# Patient Record
Sex: Male | Born: 1965 | Race: Black or African American | Hispanic: No | Marital: Married | Smoking: Never smoker
Health system: Southern US, Community
[De-identification: ages and names within clinical notes are randomized; demographics above are authoritative.]

## PROBLEM LIST (undated history)

## (undated) DIAGNOSIS — R0602 Shortness of breath: Secondary | ICD-10-CM

## (undated) DIAGNOSIS — M5136 Other intervertebral disc degeneration, lumbar region: Secondary | ICD-10-CM

## (undated) DIAGNOSIS — M549 Dorsalgia, unspecified: Secondary | ICD-10-CM

## (undated) DIAGNOSIS — R5383 Other fatigue: Secondary | ICD-10-CM

## (undated) DIAGNOSIS — E785 Hyperlipidemia, unspecified: Secondary | ICD-10-CM

## (undated) DIAGNOSIS — E291 Testicular hypofunction: Secondary | ICD-10-CM

## (undated) DIAGNOSIS — I10 Essential (primary) hypertension: Secondary | ICD-10-CM

## (undated) DIAGNOSIS — M2550 Pain in unspecified joint: Secondary | ICD-10-CM

## (undated) HISTORY — DX: Hyperlipidemia, unspecified: E78.5

## (undated) HISTORY — DX: Testicular hypofunction: E29.1

## (undated) HISTORY — DX: Pain in unspecified joint: M25.50

## (undated) HISTORY — DX: Other intervertebral disc degeneration, lumbar region: M51.36

## (undated) HISTORY — DX: Other fatigue: R53.83

## (undated) HISTORY — DX: Shortness of breath: R06.02

## (undated) HISTORY — DX: Dorsalgia, unspecified: M54.9

---

## 1998-11-30 ENCOUNTER — Emergency Department (HOSPITAL_COMMUNITY): Admission: EM | Admit: 1998-11-30 | Discharge: 1998-12-01 | Payer: Self-pay | Admitting: Internal Medicine

## 1999-12-19 ENCOUNTER — Emergency Department (HOSPITAL_COMMUNITY): Admission: EM | Admit: 1999-12-19 | Discharge: 1999-12-19 | Payer: Self-pay

## 2000-01-05 ENCOUNTER — Ambulatory Visit (HOSPITAL_COMMUNITY): Admission: RE | Admit: 2000-01-05 | Discharge: 2000-01-05 | Payer: Self-pay | Admitting: Neurosurgery

## 2000-02-16 ENCOUNTER — Encounter: Admission: RE | Admit: 2000-02-16 | Discharge: 2000-03-11 | Payer: Self-pay | Admitting: Neurosurgery

## 2001-06-13 ENCOUNTER — Emergency Department (HOSPITAL_COMMUNITY): Admission: EM | Admit: 2001-06-13 | Discharge: 2001-06-13 | Payer: Self-pay | Admitting: Emergency Medicine

## 2001-06-13 ENCOUNTER — Encounter: Payer: Self-pay | Admitting: Emergency Medicine

## 2002-03-31 ENCOUNTER — Emergency Department (HOSPITAL_COMMUNITY): Admission: EM | Admit: 2002-03-31 | Discharge: 2002-03-31 | Payer: Self-pay | Admitting: Emergency Medicine

## 2002-11-29 ENCOUNTER — Emergency Department (HOSPITAL_COMMUNITY): Admission: EM | Admit: 2002-11-29 | Discharge: 2002-11-29 | Payer: Self-pay | Admitting: Emergency Medicine

## 2002-11-29 ENCOUNTER — Encounter: Payer: Self-pay | Admitting: Emergency Medicine

## 2003-01-11 ENCOUNTER — Encounter: Payer: Self-pay | Admitting: Emergency Medicine

## 2003-01-11 ENCOUNTER — Emergency Department (HOSPITAL_COMMUNITY): Admission: EM | Admit: 2003-01-11 | Discharge: 2003-01-11 | Payer: Self-pay | Admitting: Emergency Medicine

## 2003-05-04 HISTORY — PX: BACK SURGERY: SHX140

## 2003-08-06 ENCOUNTER — Emergency Department (HOSPITAL_COMMUNITY): Admission: EM | Admit: 2003-08-06 | Discharge: 2003-08-06 | Payer: Self-pay | Admitting: Emergency Medicine

## 2003-09-12 ENCOUNTER — Inpatient Hospital Stay (HOSPITAL_COMMUNITY): Admission: RE | Admit: 2003-09-12 | Discharge: 2003-09-22 | Payer: Self-pay | Admitting: Neurological Surgery

## 2003-10-15 ENCOUNTER — Encounter: Admission: RE | Admit: 2003-10-15 | Discharge: 2003-10-15 | Payer: Self-pay | Admitting: Neurological Surgery

## 2003-12-03 ENCOUNTER — Encounter: Admission: RE | Admit: 2003-12-03 | Discharge: 2003-12-03 | Payer: Self-pay | Admitting: Neurological Surgery

## 2004-03-31 ENCOUNTER — Encounter: Admission: RE | Admit: 2004-03-31 | Discharge: 2004-03-31 | Payer: Self-pay | Admitting: Neurological Surgery

## 2004-04-14 ENCOUNTER — Emergency Department (HOSPITAL_COMMUNITY): Admission: AC | Admit: 2004-04-14 | Discharge: 2004-04-15 | Payer: Self-pay

## 2004-05-07 ENCOUNTER — Ambulatory Visit (HOSPITAL_COMMUNITY): Admission: RE | Admit: 2004-05-07 | Discharge: 2004-05-07 | Payer: Self-pay | Admitting: Chiropractic Medicine

## 2004-07-03 ENCOUNTER — Encounter: Admission: RE | Admit: 2004-07-03 | Discharge: 2004-07-03 | Payer: Self-pay | Admitting: Neurological Surgery

## 2004-09-06 ENCOUNTER — Emergency Department (HOSPITAL_COMMUNITY): Admission: EM | Admit: 2004-09-06 | Discharge: 2004-09-06 | Payer: Self-pay | Admitting: Emergency Medicine

## 2005-01-27 ENCOUNTER — Encounter: Admission: RE | Admit: 2005-01-27 | Discharge: 2005-01-27 | Payer: Self-pay | Admitting: Neurological Surgery

## 2005-08-25 ENCOUNTER — Encounter: Admission: RE | Admit: 2005-08-25 | Discharge: 2005-08-25 | Payer: Self-pay | Admitting: Neurological Surgery

## 2006-06-10 ENCOUNTER — Inpatient Hospital Stay (HOSPITAL_COMMUNITY): Admission: EM | Admit: 2006-06-10 | Discharge: 2006-06-11 | Payer: Self-pay | Admitting: Emergency Medicine

## 2007-05-30 ENCOUNTER — Ambulatory Visit: Payer: Self-pay | Admitting: Cardiovascular Disease

## 2007-05-30 ENCOUNTER — Inpatient Hospital Stay (HOSPITAL_COMMUNITY): Admission: EM | Admit: 2007-05-30 | Discharge: 2007-05-31 | Payer: Self-pay | Admitting: Emergency Medicine

## 2007-05-30 ENCOUNTER — Encounter (INDEPENDENT_AMBULATORY_CARE_PROVIDER_SITE_OTHER): Payer: Self-pay | Admitting: Cardiovascular Disease

## 2007-05-31 ENCOUNTER — Ambulatory Visit: Payer: Self-pay | Admitting: Internal Medicine

## 2008-08-08 ENCOUNTER — Ambulatory Visit: Payer: Self-pay | Admitting: Cardiology

## 2008-08-08 ENCOUNTER — Encounter: Payer: Self-pay | Admitting: Cardiology

## 2008-08-08 ENCOUNTER — Inpatient Hospital Stay (HOSPITAL_COMMUNITY): Admission: EM | Admit: 2008-08-08 | Discharge: 2008-08-10 | Payer: Self-pay | Admitting: Emergency Medicine

## 2008-10-07 ENCOUNTER — Emergency Department (HOSPITAL_BASED_OUTPATIENT_CLINIC_OR_DEPARTMENT_OTHER): Admission: EM | Admit: 2008-10-07 | Discharge: 2008-10-08 | Payer: Self-pay | Admitting: Emergency Medicine

## 2010-05-23 ENCOUNTER — Encounter: Payer: Self-pay | Admitting: Neurological Surgery

## 2010-08-10 LAB — BASIC METABOLIC PANEL
BUN: 15 mg/dL (ref 6–23)
CO2: 27 mEq/L (ref 19–32)
Calcium: 9.5 mg/dL (ref 8.4–10.5)
Glucose, Bld: 84 mg/dL (ref 70–99)
Sodium: 143 mEq/L (ref 135–145)

## 2010-08-12 LAB — CBC
HCT: 37.5 % — ABNORMAL LOW (ref 39.0–52.0)
HCT: 38.6 % — ABNORMAL LOW (ref 39.0–52.0)
HCT: 39.9 % (ref 39.0–52.0)
Hemoglobin: 13.6 g/dL (ref 13.0–17.0)
MCHC: 33.3 g/dL (ref 30.0–36.0)
MCV: 87.7 fL (ref 78.0–100.0)
MCV: 87.9 fL (ref 78.0–100.0)
MCV: 88.6 fL (ref 78.0–100.0)
Platelets: 217 10*3/uL (ref 150–400)
RDW: 12.2 % (ref 11.5–15.5)
RDW: 12.4 % (ref 11.5–15.5)
RDW: 12.6 % (ref 11.5–15.5)

## 2010-08-12 LAB — DIFFERENTIAL
Basophils Absolute: 0 10*3/uL (ref 0.0–0.1)
Eosinophils Absolute: 0.2 10*3/uL (ref 0.0–0.7)
Eosinophils Relative: 3 % (ref 0–5)
Lymphocytes Relative: 31 % (ref 12–46)
Monocytes Absolute: 0.4 10*3/uL (ref 0.1–1.0)
Neutro Abs: 4.2 10*3/uL (ref 1.7–7.7)
Neutrophils Relative %: 60 % (ref 43–77)

## 2010-08-12 LAB — CARDIAC PANEL(CRET KIN+CKTOT+MB+TROPI)
CK, MB: 1.6 ng/mL (ref 0.3–4.0)
Relative Index: 1 (ref 0.0–2.5)
Total CK: 189 U/L (ref 7–232)
Troponin I: <0.01 ng/mL (ref 0.00–0.06)

## 2010-08-12 LAB — CK TOTAL AND CKMB (NOT AT ARMC)
CK, MB: 1.9 ng/mL (ref 0.3–4.0)
Relative Index: 0.9 (ref 0.0–2.5)

## 2010-08-12 LAB — COMPREHENSIVE METABOLIC PANEL
AST: 22 U/L (ref 0–37)
Albumin: 3.6 g/dL (ref 3.5–5.2)
BUN: 10 mg/dL (ref 6–23)
CO2: 25 mEq/L (ref 19–32)
GFR calc Af Amer: >60 mL/min (ref >60)
GFR calc non Af Amer: >60 mL/min (ref >60)

## 2010-08-12 LAB — TROPONIN I: Troponin I: <0.01 ng/mL (ref 0.00–0.06)

## 2010-08-12 LAB — LIPID PANEL
HDL: 35 mg/dL — ABNORMAL LOW (ref >39)
LDL Cholesterol: 118 mg/dL — ABNORMAL HIGH (ref 0–99)
Triglycerides: 183 mg/dL — ABNORMAL HIGH (ref <150)
VLDL: 37 mg/dL (ref 0–40)

## 2010-08-12 LAB — TSH: TSH: 0.868 u[IU]/mL (ref 0.350–4.500)

## 2010-09-15 NOTE — Discharge Summary (Signed)
Richard Beltran, Richard Beltran NO.:  [id number]   MEDICAL RECORD NO.:  [id number]          PATIENT TYPE:  INP   LOCATION:  3742                         FACILITY:  [facility]   PHYSICIAN:  Richard Frieze. Jens Som, MD, FACCDATE OF BIRTH:  01/29/1966   DATE OF ADMISSION:  08/08/2008  DATE OF DISCHARGE:  08/10/2008                               DISCHARGE SUMMARY   PRIMARY CARDIOLOGIST:  Richard Buckles. Bensimhon, MD   PRIMARY CARE Richard Beltran:  Richard Churn. Allyne Gee, MD   DISCHARGE DIAGNOSIS:  Chest pain.   SECONDARY DIAGNOSES:  1. Hypertension.  2. History of low back pain, status post surgery.  3. History of 6-mm left lower lobe pulmonary nodule, last evaluated by      CT in May 30, 2007.   ALLERGIES:  No known drug allergies.   PROCEDURES:  Exercise Myoview revealing no evidence of infarct or  ischemia.  EF 49%.  A 2-D echocardiogram performed on August 08, 2008,  showing EF 55-65%.  No regional wall motion abnormalities.  Mild LVH.  Mildly dilated left atrium.   HISTORY OF PRESENT ILLNESS:  A 45 year old African American male with  prior history of hypertension.  He was in his usual state of health  until date of admission when he had an episode of chest discomfort with  nausea and dyspnea.  The patient presented to the [facility] ED where  initially he was called as a code STEMI secondary to J-point elevation  in anterior leads.  However, upon comparison to old ECGs, he had no  acute changes.  Code STEMI was called off and the patient did not  undergo catheterization.  He was, however, admitted for further  evaluation and rule out.   HOSPITAL COURSE:  The patient had no additional chest discomfort and  ruled out for MI by cardiac markers.  He underwent exercise Myoview this  morning, walking for a total of 11 minutes and 30 seconds to a maximum  heart rate of 171 beats per minute.  He had no recurrent chest pain.  He  did have a hypertensive response to exercise with blood pressure  rising  to 242/75.  Procedure and imaging has shown no evidence of ischemia or  infarct.  We also performed 2-D echocardiogram in this admission, which  showed normal LV function.  Mr. Richard Beltran will be discharged to home today  in good condition.   DISCHARGE LABS:  Hemoglobin 13.6, hematocrit 39.9, WBC 6.7, platelets  221.  INR 1.0.  Sodium 139, potassium 3.7, chloride 106, CO2 of 25, BUN  10, creatinine 0.91, glucose 111.  Total bilirubin 0.7, alkaline  phosphatase 77, AST 22, ALT 36, total protein 6.5, albumin 3.6, calcium  9.0, magnesium 2.1.  Hemoglobin A1c 5.2.  CK 175, MB 1.2, troponin I  less than 0.01.  Total cholesterol 190, triglycerides 183, HDL 35, LDL  118.  TSH 0.868.   DISPOSITION:  The patient will be discharged home today in good  condition.   FOLLOWUP PLANS AND APPOINTMENTS:  We will arrange for followup with Dr.  Gala Beltran in approximately 4-6 weeks.  He is asked to  follow up with Dr.  Allyne Beltran as previously scheduled.   DISCHARGE MEDICATIONS:  1. Aspirin 81 mg daily.  2. Norvasc 10 mg daily.  3. Benicar/hydrochlorothiazide 40/12.5 mg daily.  4. Flexeril 10 mg t.i.d. p.r.n.  5. Vicodin 5/500 mg p.r.n.   OUTSTANDING LAB STUDIES:  None.   DURATION OF DISCHARGE/ENCOUNTER:  40 minutes including physician time.      Richard Beltran, ANP      Richard Frieze. Jens Som, MD, [facility]  Electronically Signed    CB/MEDQ  D:  08/10/2008  T:  08/11/2008  Job:  [id number]   cc:   Richard Beltran, M.D.

## 2010-09-15 NOTE — H&P (Signed)
Richard Beltran, Richard Beltran NO.:  [id number]   MEDICAL RECORD NO.:  [id number]          PATIENT TYPE:  INP   LOCATION:  2920                         FACILITY:  [facility]   PHYSICIAN:  Madolyn Frieze. Jens Som, MD, FACCDATE OF BIRTH:  12/13/1965   DATE OF ADMISSION:  08/08/2008  DATE OF DISCHARGE:                              HISTORY & PHYSICAL   PRIMARY CARDIOLOGIST:  Bevelyn Buckles. Bensimhon, MD   PRIMARY MEDICAL DOCTOR:  Candyce Churn. Allyne Gee, MD   CHIEF COMPLAINT:  Chest pain, initially called code STEMI.   HISTORY OF PRESENT ILLNESS:  This is a 45 year old male with no known  history of CAD but history of hypertension and noncardiac chest pain in  the past presenting with an initially called code STEMI but changed to  chest pain, ruled out ACS in the cath lab by Dr. Jens Som.  The patient  reports nausea and vomiting starting on Tuesday evening with another  episode on Wednesday afternoon.  Both times, the patient also had chest  pain with some shortness of breath.  Today, Thursday, August 08, 2008, the  patient walked out to his car and experienced 10/10 chest pain with  severe shortness of breath.  At this time, there was no nausea or  vomiting.  Chest pain is located in the substernal area without  radiation.  It resolved with rest after 1-2 hours.  Currently upon  evaluation in the hospital, first in the cath lab and now in the  emergency department, the patient has 8/10 chest pain with much improved  shortness of breath.   PAST MEDICAL HISTORY:  1. Hypertension.  2. Low back pain with history of L4-L5 pedicle screw fusion.  3. A 6-mm left lower lobe pulmonary nodule.   SOCIAL HISTORY:  The patient lives in [place] with his wife.  He is  in IT.  He works full time in Consulting civil engineer.  He has no smoking history, no alcohol  use, and no illicit drug use.  He takes no herbal medications.  He has a  regular diet with no significant regular exercise.   FAMILY HISTORY:  Negative for any  premature coronary artery disease.   REVIEW OF SYSTEMS:  Please see HPI, also the patient notes that the 3-4  episodes of chest pain he has had and shortness of breath now along with  dyspnea on exertion this morning are all new and not typical for him,  also the nausea and vomiting and 2 episodes of diarrhea he has had are  also new in the last few days.  All other systems reviewed and are  negative.   ALLERGIES:  No known drug allergies.   MEDICATIONS:  1. Aspirin 81 mg p.o. daily.  2. Norvasc 10 mg p.o. daily.  3. Benicar 40 mg p.o. daily.  4. Hydrochlorothiazide 12.5 mg p.o. daily.  5. P.r.n. meds, Flexeril and Vicodin for back pain.   PHYSICAL EXAMINATION:  VITAL SIGNS:  BP 150s/80s, pulse 60s-80s,  respiration rate 20, O2 saturation greater than 90% on 2 liters by nasal  cannula, temperature is not recorded yet.  GENERAL:  The patient  is alert and oriented x3, in minimal distress,  able to speak in full sentences without respiratory distress.  HEENT:  Head normocephalic, atraumatic.  Pupils equal, round, and  reactive to light.  Extraocular muscles were intact.  Nares were patent  without discharge.  Dentition was good.  Oropharynx without erythema or  exudate.  NECK:  Supple without lymphadenopathy.  No JVD.  No thyromegaly.  HEART:  Rate regular with audible S1 and S2.  No clicks, rubs, murmurs,  or gallops.  Pulses are 2+ and equal in both upper and lower extremities  bilaterally.  LUNGS: Clear to auscultation bilaterally.  SKIN:  No rashes, lesions, or petechiae.  ABDOMEN:  The patient is morbidly obese.  Abdomen is soft, nontender,  nondistended.  Normal abdominal bowel sounds.  No rebound or guarding.  No hepatosplenomegaly.  EXTREMITIES:  No clubbing, cyanosis, or edema.  MUSCULOSKELETAL:  No joint deformity or effusions.  No spinal or CVA  tenderness.  NEUROLOGIC:  Cranial nerves II through XII are grossly intact.  Strength  is 5/5 in all extremities and axial  groups.  Normal sensation throughout  and normal cerebellar function.   RADIOLOGY:  The patient had an echocardiogram that upon preliminary  reading by Dr. Jens Som in the emergency department showed no wall  motion abnormalities.  Chest x-ray is pending.  Formal reading of  echocardiogram is pending.  EKG showed a sinus rhythm with a rate of 91 bpm.  The patient does have  T-wave flattening in I and aVL, and V4 and T-wave inversion in V5 and  V6.  It does appear different from prior EKGs, last one performed on  May 30, 2007.  He has normal axis, no evidence of hypertrophy, and  no significant Q waves.  PR is 146, QRS is 88, QTC is 418.   LABORATORY DATA:  Pending.   ASSESSMENT AND PLAN:  This is a 45 year old male with past medical  history of hypertension, S/P back surgery presenting with chest pain.  He has had 2 previous episodes of chest pain requiring admission, the  last one was in January 2009.  Myoview showed an ejection fraction of  55% with no ischemia.  CTA in February 2008, no pulmonary embolus, left  lower lobe nodule.  The patient has felt sick for 1 week approximately  with complaints of weakness, nausea, vomiting, and diarrhea as well as  achiness, however, denies fevers, chills, cough, or hemoptysis, also  with intermittent chest pain, heavy pressure aching without radiation.  Positive for shortness of breath.  Negative for diaphoresis,  nonpleuritic, not always exertional, not related to food, episodes last  for approximately 2 hours.  Today, he had a severe episode and EMS was  called, code STEMI was called based on initially eval of ECG due to some  slight ST elevation in V1 through V3 with inferior T-wave inversion;  however, ST elevation seems unchanged from prior tracing.  Etiology of  pain is unclear at this point.  ECG seems unchanged and initial reading  of echocardiogram shows no wall motion abnormalities.  We will plan to  cycle cardiac enzymes, treat  with aspirin and  heparin, as well as nitrates, and morphine for pain should they be  needed after IV nitroglycerin is started.  If enzymes are negative, we  will plan for Myoview; if positive, we will proceed with cardiac cath  planning.  The patient with recent GI illness?  GI pain, we will add  Protonix for now, may  need followup CT for a previous nodule.      Jarrett Ables, PAC      Madolyn Frieze. Jens Som, MD, [facility]  Electronically Signed    MS/MEDQ  D:  08/08/2008  T:  08/09/2008  Job:  [id number]

## 2010-09-15 NOTE — H&P (Signed)
Richard Beltran, DEVONSHIRE NO.:  [id number]   MEDICAL RECORD NO.:  [id number]          PATIENT TYPE:  INP   LOCATION:  1226                         FACILITY[facility]   PHYSICIAN:  Christell Faith, MD   DATE OF BIRTH:  11-30-1965   DATE OF ADMISSION:  05/29/2007  DATE OF DISCHARGE:                              HISTORY & PHYSICAL   PRIMARY CARE PHYSICIAN:  Dr. Dorothyann Peng   CHIEF COMPLAINT:  Chest pressure.   HISTORY OF PRESENT ILLNESS:  This is a 45 year old African American male  with a history of hypertension and obesity who has experienced  intermittent chest pressure for the past two days.  Today at work, he  had nausea all day and vomited approximately four times, so he left work  early.  Later while home watching TV, he became diaphoretic with 10/10  retrosternal chest pressure and significant left arm symptoms including  pain, numbness and tingling.  He also became very short of breath.  The  pain is much improved in the emergency department after nitroglycerin  and morphine, however, he still rates it 5/10.  The patient looks very  comfortable and is actually drifting off to sleep.   PAST MEDICAL HISTORY:  1. Low back pain with a history of L4-L5 pedicle screw fusion.  2. Hypertension.  3. Atypical chest pain admission in February of 2008.  4. Incidentally found to have a 6 mm left lower lobe pulmonary nodule      in February of 2008.   SOCIAL HISTORY:  Lives in [place] with his wife and children.  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   FAMILY HISTORY:  Mother is alive with hypertension.  Father died and had  diabetes.  He has siblings who are healthy.   ALLERGIES:  None.   MEDICINES:  1. Benicar/hydrochlorothiazide 40/25 mg p.o. q. day.  2. Norvasc 10 mg p.o. q. day.  3. Vicodin p.r.n.  4. Flexeril p.r.n.   REVIEW OF SYSTEMS:  Positive for recent URI with fever and  epistaxis,  positive for chest pain and shortness of breath as described above,  positive for nausea and vomiting.  Evaluations of 14 systems are  reviewed and are negative.   PHYSICAL EXAMINATION:  VITAL SIGNS:  Temperature is 98.2, pulse  initially 140, then 114, then 87; respiratory rate 12, blood pressure  151/105, oxygen saturation 100% on room air, weight 264 pounds.  GENERAL:  This is a pleasant, obese, African American man in no  distress.  HEENT:  Pupils are equal, round and reactive, sclerae clear, extraocular  movements are intact.  Head is normocephalic, atraumatic.  Mucous  membranes are moist.  Dentition is good, oropharynx is clear without  erythema or exudates.  NECK:  Supple without lymphadenopathy, JVD or bruits.  He does have  acanthosis on his neck, no lymphadenopathy.  CARDIAC EXAM:  Normal rate, regular rhythm, normal S1 and S2, no S3 or  S4, no murmurs or rubs, 2+ radial and dorsalis pedis pulses bilaterally.  LUNGS:  Clear to auscultation bilaterally without wheezing or rales.  ABDOMEN:  Obese, soft, nontender, nondistended with normal bowel sounds.  EXTREMITIES:  Revealed no clubbing or cyanosis, no edema, no rash, no  petechiae.  MUSCULOSKELETAL:  No joint effusions.  NEUROLOGIC:  5/5 strength in all four extremities including left arm and  left hand.   DIAGNOSTIC TESTS:  Chest x-ray:  No acute cardiopulmonary disease.  Electrocardiogram:  Sinus tachycardia, rate of 114 beats a minute with  less than 1 mm of scooped inferior ST depression, corrected QT interval  471 msec.   LABORATORY DATA:  White blood cells 8.2, hemoglobin 14.3, platelets 249.  Sodium 137, potassium 3.6, BUN 11, creatinine 0.9, glucose 122.  Point  of care CK-MB 1.8, point of care troponin less than 0.05.   IMPRESSION:  A 45 year old [place] American male with probable unstable  angina.   PLAN:  1. Admit to telemetry, rule out myocardial infarction, cycle serial      EKGs and  cardiac markers.  2. Continue aspirin and heparin initiated in the emergency department.  3. Check fasting lipid panel and initiate statin.  Continue Benicar,      hydrochlorothiazide, and Norvasc and initiate beta-blocker therapy.  4. Anticipate stress test versus catheterization in the morning.  Will      prehydrate with normal saline and keep NPO.  5. The patient is noted to have hyperglycemia and acanthosis on exam.      Will check fasting blood sugar and hemoglobin A1c to exclude      diabetes.  6. Significant shortness of breath.  Will check BNP and echo.  7. Will check D-dimer and if positive, will consider a CT scan this      admission to rule out PE as well as to follow up left lower lobe      pulmonary nodule seen on the prior CT scan from February 2008.      Christell Faith, MD  Electronically Signed     NDL/MEDQ  D:  05/30/2007  T:  05/30/2007  Job:  [id number]

## 2010-09-18 NOTE — Discharge Summary (Signed)
NAME:  Richard Beltran, SPARK                       ACCOUNT NO.:  [id number]   MEDICAL RECORD NO.:  [id number]                   PATIENT TYPE:  INP   LOCATION:  3009                                 FACILITY:  [facility]   PHYSICIAN:  Tia Alert, MD                  DATE OF BIRTH:  1965-06-01   DATE OF ADMISSION:  09/12/2003  DATE OF DISCHARGE:  09/22/2003                                 DISCHARGE SUMMARY   ADMISSION DIAGNOSIS:  Degenerative disk disease with back pain and spinal  stenosis, L4-L5.   PROCEDURE:  TLIF  L4-L5.   BRIEF HISTORY OF PRESENT ILLNESS:  Mr. Masoner is a 45 year old black male  with a long history of back pain and leg pain.  He had an MRI which showed  degenerative disk disease at L4-L5 with epidural lipomatosis causing spinal  stenosis.  He had tried medical management for quite some time without  significant relief.  I recommended a transforaminal lumbar interbody fusion  at L4-L5 with nonsegmental instrumentation.  He understood the risks,  benefits, and alternatives and wished to proceed.   HOSPITAL COURSE:  The patient was admitted on 09/12/2003 and taken to the  operating room, and he underwent a TLIF at L4-L5.  The patient tolerated the  procedure well.  He was taken to the recovery room and then to the floor in  stable condition.  For details of the operative procedure, please see the  dictated operative note.  The patient's hospital course was prolonged by a  prolonged ileus.  He was made NPO for several days.  He had very little in  the way of flatus and no bowel movement despite significant bowel care and  stool softeners.  He had multiple enemas.  We placed him on Reglan to try to  help.  His incision remained clean, dry, and intact.  He did have some  postoperative leg pain.  It resolved.  He was able to get up and ambulate in  his brace with a walker with minimal assistance.  He remained afebrile with  stable vital signs.  Acute abdominal series, KUB,  showed dilated bowel  consistent with an ileus.  He did have some nausea and vomiting related to  this, and this definitely prolonged his hospital course.  Once he was able  to have a bowel movement and was able to tolerate a regular diet, he was  discharged home in stable condition on 09/22/2003.   DISCHARGE MEDICATIONS:  1. Percocet.  2. Flexeril.   He was asked to call for any unusual redness, tenderness, swelling, or  drainage from his wound or any temperature above 101.5.  He was to continue  his Norvasc and Lescol as he was taking at home.   His activities were as per his discharge instruction sheet.   FINAL DIAGNOSES:  1. 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  2. Ileus.                                                Tia Alert, MD    DSJ/MEDQ  D:  10/11/2003  T:  10/12/2003  Job:  [id number]

## 2010-09-18 NOTE — Op Note (Signed)
NAME:  Richard Beltran, Richard Beltran NO.:  [id number]   MEDICAL RECORD NO.:  [id number]                   PATIENT TYPE:  INP   LOCATION:  3009                                 FACILITY:  [facility]   PHYSICIAN:  Tia Alert, MD                  DATE OF BIRTH:  1966/02/24   DATE OF PROCEDURE:  09/12/2003  DATE OF DISCHARGE:                                 OPERATIVE REPORT   PREOPERATIVE DIAGNOSIS:  Degenerative disk disease at L4-5 with epidural  lipomatosis causing spinal stenosis causing back pain and right leg pain.   POSTOPERATIVE DIAGNOSIS:  Degenerative disk disease at L4-5 with epidural  lipomatosis causing spinal stenosis causing back pain and right leg pain.   PROCEDURE:  1. Decompressive laminectomy, hemifacetectomy, and foraminotomy L4-5 for     central canal and nerve root decompression.  2. Transforaminal lumbar interbody fusion L4-5 utilizing a 12 x 26 mm Peek     interbody cage packed with local autograft.  3. Intertransverse arthrodesis L4-5 on the left utilizing autograft.  4. Nonsegmental fixation L4-5 utilizing the [employer]compass pedicle screw and rod     fixation system.   SURGEON:  Tia Alert, M.D.   ASSISTANT:  Donalee Citrin, M.D.   ANESTHESIA:  General endotracheal.   COMPLICATIONS:  Small dural tear repaired primarily.   INDICATIONS FOR PROCEDURE:  The patient is a 45 year old black male who is  referred by Callie Fielding, M.D. for further evaluation of back pain with  right leg pain.  He had an MRI which showed epidural lipomatosis with  degenerative disk disease at L4-5.  He had spinal stenosis related to the  epidural lipomatosis.  He tried medical management for quite some time  without significant relief.  He had a diskogram which was strongly positive  at L4-5 and negative at L3-4 and L5-S1.  We recommended a lumbar  decompression followed by instrumented fusion to address both the lumbar  spinal stenosis and the degenerative disk  disease to try to help his back  and his right leg pain.  He understood the risks, the benefits, and the  alternatives and wished to proceed.   DESCRIPTION OF PROCEDURE:  The patient was taken to the operating room and  after induction of adequate general endotracheal anesthesia, he was rolled  in the prone position on the Wilson frame and all pressure points were  padded.  His lumbar region was prepped with Duraprep and then draped in the  usual sterile fashion.  10 mL of a local anesthesia was injected and then a  dorsal midline incision was made and carried down to the lumbosacral fascia.  The fascia was opened and the paraspinous musculature was taken down in a  subperiosteal fashion to expose the L4-5 interspace.  Once intraoperative  fluoroscopy confirmed our level, we dissected out over the transverse  processes and found the transverse processes at L4-5 bilaterally.  We then  used the combination of a Leksell rongeur and Kerrison punches to perform a  complete laminectomy, hemifacetectomies, and bilateral foraminotomies at L4-  5.  The L4 and L5 nerve roots were identified and carried out into their  respective foramen.  He had significant epidural lipomatosis which was  removed.  There was a small dural tear which we repaired with a single 4-0  Nurolon suture.  Once the decompression was complete, we turned our  attention to the TLIF.  We incised the disk space on the patient's right  side and performed a thorough diskectomy with pituitary rongeurs and curets.  Once this was complete, we distracted the disk space up to 12 mm and then  were able to tap a 12 mm x 26 mm Peek interbody cage packed with local  autograft into the interspace.  The interspace was packed with local  autograft just prior to placing the interbody cage also.  Once the TLIF was  complete, we turned our attention to the nonsegmental fixation.  We  localized the pedicle screw entry zones at L4 and L5 bilaterally  and then  under fluoroscopic guidance, we probed each pedicle, tapped each pedicle,  and then placed six 5 x 45 mm pedicle screws into the pedicles of L4 and L5  bilaterally.  We then prepared the transverse processes of L4-5 on the left  side and placed autograft out over these to perform intertransverse  arthrodesis.  We then placed two lordotic rods into the multiaxial screw  heads with the pedicle screws and locked these into position with the  locking caps.  We then placed a separate crosslink.  We then irrigated with  copious amounts of bacitracin containing saline solution, lined the dural  tear with Tisseel fibrin glue and then used Gelfoam over the exposed dura.  Placed a medium Hemovac drain through a separate stab incision and then  closed the fascia with interrupted 0 Vicryl.  We closed the subcutaneous and  subcuticular tissue with 2-0 and 3-0 Vicryl and closed the skin with Benzoin  and Steri-Strips.  Then drapes were removed, a sterile dressing was applied.  The patient was awakened from general anesthesia and transferred to the  recovery room in stable condition.  At the end of the procedure, all needle,  sponge, and instrument counts correct.                                               Tia Alert, MD    DSJ/MEDQ  D:  09/12/2003  T:  09/13/2003  Job:  [id number]

## 2010-09-18 NOTE — Discharge Summary (Signed)
NAMEMOSES, ODOHERTY NO.:  [id number]   MEDICAL RECORD NO.:  [id number]          PATIENT TYPE:  INP   LOCATION:  3704                         FACILITY:  [facility]   PHYSICIAN:  Hillery Aldo, M.D.   DATE OF BIRTH:  [birth year]   DATE OF ADMISSION:  06/09/2006  DATE OF DISCHARGE:  06/11/2006                               DISCHARGE SUMMARY   PRIMARY CARE PHYSICIAN:  Is Dr. Dorothyann Peng   DISCHARGE DIAGNOSES:  1. Atypical chest pain.  2. Chronic back pain.  3. Hypertension.  4. Mild dyslipidemia.  5. Recent upper respiratory infection.  6. Gastroesophageal reflux disease.  7. 6 mm left lower lobe pulmonary nodule, follow-up CT recommended in      12 months.   DISCHARGE MEDICATIONS:  1. Metoprolol 25 mg b.i.d.  2. Hydrochlorothiazide 25 mg daily.  3. Vicodin 5/500 1-2 tablets q.4-6 h p.r.n.  4. Protonix 40 mg daily.   CONSULTATION:  None.   BRIEF ADMISSION HPI:  The patient is a 45 year old male with no  significant past medical history who developed the sudden onset of sharp  left-sided chest pain while watching TV on the day of admission.  The  patient presented to the emergency department where he continued to have  chest pain and was put on a nitroglycerin drip which did not appreciably  alleviate his pain.  The pain had been intermittent.  It did not have  any anginal type characteristics.  Nevertheless, it was decided to admit  him for further risk stratification and evaluation.   PROCEDURES AND DIAGNOSTIC STUDIES:  1. Chest X-Ray: On June 09, 2006 showed low lung volumes.  There      was mild apparent enlargement of cardiac silhouette.  No      infiltrates or effusions.  2. CT angiogram of the chest and abdomen on May 10, 2006 showed      normal aortic opacification without evidence for dissection or      significant atherosclerotic disease.  There was a 6 mm left lower      lobe pulmonary nodule.  This is considered a low risk lesion and a      28-month follow-up is recommended.  There were areas of bibasilar      atelectasis as well.  The angiogram was negative for dissection of      the abdominal aorta.  There was postoperative changes of the lower      lumbar spine noted.   DISCHARGE LABORATORY VALUES:  Cardiac enzymes were negative x3 sets.  Sodium was 137, potassium 3.7, chloride 102, bicarb 29, BUN 8,  creatinine 0.99, glucose 97.  White blood cell count was 6.8, hemoglobin  13.4, hematocrit 39.4, and platelets 231.  TSH was 0.920.   HOSPITAL COURSE:  Problem 1.  Atypical chest pain:  The patient's chest  pain was markedly atypical in that it was sharp in nature,  nonexertional, and not associated with any worrisome features.  It did  not appreciably respond to nitroglycerin.  A full diagnostic workup was  undertaken including monitoring the patient on telemetry, obtaining  serial enzymes, and  obtaining a CT angiogram of the chest and abdomen to  rule out aortic dissection.  There is no evidence of pulmonary embolism  or pneumothorax on CT scanning.  The patient's pain did not respond to  nitroglycerin.  He has no family history of coronary artery disease.  His risk factors include his male sex and his mild dyslipidemia as well  as hypertension.  Given his young age and atypical symptoms, the patient  was felt to have a noncardiac source of chest pain and was deemed stable  for discharge on June 11, 2006.  His chest pain was most likely  musculoskeletal in origin from his repeat recent upper respiratory  infection, possibly a pulled muscle from coughing.  Alternatively, he  could have gastroesophageal reflux or pill esophagitis having recently  been on azithromycin for treatment of upper respiratory infection.  The  patient should follow up with his primary care physician early next week  and consideration for an outpatient cardiac stress test can be  entertained if it is felt reasonable by his primary care  physician.   Problem 2.  Hypertension:  The patient does have hypertension.  He has  not been on any medications for his hypertension prior to his admission  here.  His systolic blood pressures ranged from 160-190 prior to  treatment.  His diastolic pressures ranged from 100-110.  He was put on  a combination of metoprolol and hydrochlorothiazide and his discharge  blood pressure is down to 146/93.  We will increase his metoprolol and  discharge him on the medications as outlined above.  He should follow up  for blood pressure check early next week with his primary care  physician.   Problem 3.  Dyslipidemia:  The patient does have mild dyslipidemia.  His  total cholesterol was 192, triglycerides 190, HDL 33, and LDL 121.  The  dietician consult was obtained for teaching the patient appropriate  cholesterol lowering diet.  He should be tried on this for 6 months and  a new fasting lipid panel rechecked in 6 months time and if he remains  under suboptimal control, consideration should be made for starting him  on treatment with a statin.  He is also instructed to start an exercise  program for 20 minutes three times a week and to gradually increase this  to 30 minutes five times a week with brisk walking, light jogging and  resistance weight training.   Problem 4.  Gastroesophageal reflux disease:  The patient does endorse  symptoms consistent with gastroesophageal reflux disease.  We will put  him on proton pump inhibitor and have him follow up with his primary  care physician.   DISPOSITION:  The patient is stable for discharge home.  Again, he will  follow up with Dr. Allyne Gee for an appointment early next week for blood  pressure check and consideration of setting up the patient for an  outpatient stress test if his symptoms persist.      Hillery Aldo, M.D.  Electronically Signed    CR/MEDQ  D:  06/11/2006  T:  06/11/2006  Job:  [id number]   cc:   Candyce Churn. Allyne Gee, M.D.

## 2010-09-18 NOTE — H&P (Signed)
NAMEELIAZER, HEMPHILL             ACCOUNT NO.:  [id number]   MEDICAL RECORD NO.:  [id number]          PATIENT TYPE:  EMS   LOCATION:  MAJO                         FACILITY:  [facility]   PHYSICIAN:  Mobolaji B. Bakare, M.D.DATE OF BIRTH:  December 25, 1965   DATE OF ADMISSION:  06/09/2006  DATE OF DISCHARGE:                              HISTORY & PHYSICAL   PRIMARY CARE PHYSICIAN:  Dr. Dorothyann Peng.   CHIEF COMPLAINT:  Chest pain.   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 last night when he was watching television.  He developed a  sharp left precordial chest pain which is rated at 10/10, radiating to  his neck, his back and his left arm, associated with nausea, but no  vomiting or shortness of breath.  There was no diaphoresis.  The patient  called EMS and he was given aspirin and nitroglycerin en route; these  partially improved the pain to 7/10.  The patient has had an EKG done  which shows normal sinus rhythm with heart rate of 71 beats per minute  and nonspecific ST abnormalities in V5 to V6.  His first set of cardiac  markers are negative.  He is currently on nitroglycerin infusion and  still rates the pain 6/10.   REVIEW OF SYSTEMS:  No fever, cough, orthopnea or PND.  No pedal edema  or palpitation.  There is no vomiting, abdominal pain or diarrhea.   PAST MEDICAL HISTORY:  1. Back pain.  2. Hypertension for which he is not using any medication.   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   MEDICATIONS:  Benzonatate, azithromycin.   ALLERGIES:  No known drug allergies.   FAMILY HISTORY:  No family history of premature coronary artery disease.  Both parents are alive and well.   SOCIAL HISTORY:  He does not smoke cigarettes or drink alcohol.  The  patient is a Engineer, civil (consulting).  He is married and has 3 children.   PHYSICAL  EXAMINATION:  INITIAL VITALS:  Temperature is 97.7, blood  pressure 171/116, pulse of 78, respiratory rate of 20, O2 SATs of 100%.  GENERAL:  The patient is uncomfortable, not in respiratory distress.  HEENT:  Normocephalic, atraumatic head.  Pupils equal, round and  reactive to light.  NECK:  No elevated JVD, no carotid bruit.  LUNGS:  Clear clinically to auscultation.  CV:  S1 and S2, regular, no murmur.  No holosystolic murmur heard.  ABDOMEN:  Not distended, soft and nontender.  Bowel sounds present.  No  palpable organomegaly.  EXTREMITIES:.  No pedal edema or calf tenderness.  CNS:  No focal neurological deficit.   INITIAL LABORATORY DATA:  PT 14.8, INR 1.1, D-dimer less than 0.22,  creatinine 1.0.  Hemoglobin 14.3, hematocrit 42.  Sodium 137, potassium  3.7, chloride 106, glucose 93, BUN 6, creatinine 1.0.  Cardiac markers  at the point of care negative.   Chest x-ray showed low lung volume, mild cardiomegaly.   ASSESSMENT  AND PLAN:  1. Mr. Gullion is a 45 year old African American male with precordial      chest pain associated with shortness of breath and radiating into      the neck, back and left arm.  He has hypertension, a new risk      factor which has remained untreated.  His D-dimer is negative,      making pulmonary embolism less likely.  We need to rule out aortic      dissection, given the history and ongoing chest pain despite      nitroglycerin infusion.  The patient will be admitted to telemetry      floor.  Cycle cardiac enzymes and continue intravenous      nitroglycerin.  Use morphine 1 mg to 2 mg intravenously q.4 h.      p.r.n. for pain, aspirin 325 mg daily, Lopressor 12.5 mg p.o.      b.i.d.  Obtain CT angiogram of the chest to rule out aortic      dissection.  Should these be negative, we will start on Lovenox 4      mg/kg subcutaneously q.12 h. and obtain Cardiology consult in the      morning.  We will check fasting lipid profile and hemoglobin A1c.   2. Hypertension, uncontrolled.  Lopressor 12.5 mg b.i.d.,      hydrochlorothiazide 25 mg daily; these will be optimized according      to blood pressure control.      Mobolaji B. Corky Downs, M.D.  Electronically Signed     MBB/MEDQ  D:  06/10/2006  T:  06/10/2006  Job:  [id number]   cc:   Candyce Churn. Allyne Gee, M.D.

## 2010-09-18 NOTE — Discharge Summary (Signed)
NAMEKENRY, Richard Beltran NO.:  [id number]   MEDICAL RECORD NO.:  [id number]          PATIENT TYPE:  INP   LOCATION:                               FACILITY:  [facility]   PHYSICIAN:  Luis Abed, MD, FACCDATE OF BIRTH:  1965-06-08   DATE OF ADMISSION:  05/30/2007  DATE OF DISCHARGE:  05/31/2007                               DISCHARGE SUMMARY   PRIMARY CARDIOLOGIST:  Bevelyn Buckles. Bensimhon, MD   PRIMARY CARE PHYSICIAN:  Robyn N. Allyne Gee, MD   FINAL DISCHARGE DIAGNOSES:  1. Noncardiac chest pain.  2. Admission for atypical chest pain, February 2008.  3. Low back pain with history of L4-L5 pedicular screw fusion.  4. Incidentally found to have a 6-mm left lower lobe pulmonary nodule      in February 2008.  5. Hypertension.   PROCEDURES PERFORMED DURING HOSPITALIZATION:  Stress Myoview __________:  No evidence of inducible ischemia and normal left ventricular wall  motion, and an ejection fraction estimated at 55%.   HOSPITAL COURSE:  This is a 45 year old African American male with  history of hypertension and obesity who experienced intermittent chest  pressure 2 days prior to admission.  While he was at work, he had nausea  all day and vomited approximately 4 times, so he left work early, later  while watching TV at home, he became diaphoretic with 10/10 retrosternal  chest pressure significant with left arm discomfort including pain,  numbness, and tingling with associated shortness of breath.  The patient  presented to [facility] emergency room secondary to these symptoms, and  he was seen and examined by Dr. Oneita Hurt, fellow for Dr. Willa Rough.  The patient was admitted to telemetry to rule out myocardial  infarction, to cycle cardiac enzymes.  He was started on aspirin and  heparin.  Fasting lipid panel was completed and a D-dimer to rule out  PE, if this was found to be negative, the patient was to be scheduled  for a stress Myoview the following day.   The patient did have the lab  cycle, and they were all found to be negative.  The patient was  subsequently scheduled for stress Myoview on May 31, 2007, which was  found to be normal secondary to the negative labs and EKG.  The patient  was found to be stable and discharged on May 25, 2007.  The patient  is to follow with primary care physician for continued evaluation of  discomfort.   DISCHARGE LABORATORY:  Hemoglobin 11.5, hematocrit 33.0, white blood  cells 7.4, and platelets 217.  Amylase 153, lipase 20, troponins  negative x3 at 0.07, 0.04, and 0.04 respectively.  D-dimer less than  0.22.  Sodium 139, potassium 3.8, chloride 101, CO2 32, glucose 86, BUN  12, and creatinine 1.05.  LFTs were found to be within normal limits.  CT scan of the chest was completed and found to be negative for aortic  aneurysm or dissection with a stable small pulmonary nodule.   DISCHARGE MEDICATIONS:  1. Norvasc 10 mg daily.  2. Benicar hydrochlorothiazide 40/12.5 mg daily.  3.  Flexeril and Vicodin as previously taken at home.  4. Aspirin 81 mg daily.   ALLERGIES:  No known drug allergies.   FOLLOWUP PLANS AND APPOINTMENT:  1. The patient is to follow with his primary care physician for      continued management and discomfort.  Possible GI workup.  2. The patient has been advised to continue taking current medication      regimen, to include antihypertensive.  3. The patient has been given instructions on weight loss.   Time spent with the patient to include physician time of 30 minutes.      Bettey Mare. Lyman Bishop, NP      Luis Abed, MD, [facility]  Electronically Signed    KML/MEDQ  D:  08/14/2007  T:  08/15/2007  Job:  [id number]

## 2011-01-21 LAB — DIFFERENTIAL
Basophils Absolute: 0.1
Basophils Relative: 1
Eosinophils Relative: 2
Monocytes Absolute: 0.6

## 2011-01-21 LAB — COMPREHENSIVE METABOLIC PANEL
ALT: 42
AST: 26
Albumin: 3.5
Alkaline Phosphatase: 63
Chloride: 101
GFR calc Af Amer: >60
Potassium: 3.8
Sodium: 139
Total Bilirubin: 1

## 2011-01-21 LAB — LIPID PANEL
Cholesterol: 199
LDL Cholesterol: 119 — ABNORMAL HIGH
Triglycerides: 199 — ABNORMAL HIGH

## 2011-01-21 LAB — CBC
HCT: 42.3
Hemoglobin: 14.3
MCHC: 33.9
Platelets: 217
Platelets: 249
RDW: 12.3
RDW: 12.5
WBC: 7.4

## 2011-01-21 LAB — HEPARIN LEVEL (UNFRACTIONATED)
Heparin Unfractionated: 0.68
Heparin Unfractionated: 0.7
Heparin Unfractionated: 0.79 — ABNORMAL HIGH
Heparin Unfractionated: <0.1 — ABNORMAL LOW

## 2011-01-21 LAB — BASIC METABOLIC PANEL
BUN: 11
CO2: 27
Calcium: 9.7
Glucose, Bld: 122 — ABNORMAL HIGH
Potassium: 3.6
Sodium: 137

## 2011-01-21 LAB — APTT: aPTT: 26

## 2011-01-21 LAB — POCT CARDIAC MARKERS
CKMB, poc: 1.2
Myoglobin, poc: 100
Operator id: 4661
Operator id: 4661
Troponin i, poc: <0.05
Troponin i, poc: <0.05

## 2011-01-21 LAB — B-NATRIURETIC PEPTIDE (CONVERTED LAB): Pro B Natriuretic peptide (BNP): <30

## 2011-01-21 LAB — CK TOTAL AND CKMB (NOT AT ARMC): CK, MB: 1.5

## 2011-01-21 LAB — CARDIAC PANEL(CRET KIN+CKTOT+MB+TROPI)
Relative Index: 0.8
Total CK: 125
Troponin I: 0.03

## 2011-01-21 LAB — TSH: TSH: 2.045

## 2011-05-19 ENCOUNTER — Emergency Department (HOSPITAL_BASED_OUTPATIENT_CLINIC_OR_DEPARTMENT_OTHER)
Admission: EM | Admit: 2011-05-19 | Discharge: 2011-05-19 | Disposition: A | Discharge status: Home / Self Care | Payer: Self-pay | Attending: Emergency Medicine | Admitting: Emergency Medicine

## 2011-05-19 ENCOUNTER — Emergency Department (INDEPENDENT_AMBULATORY_CARE_PROVIDER_SITE_OTHER): Payer: Self-pay

## 2011-05-19 ENCOUNTER — Encounter (HOSPITAL_BASED_OUTPATIENT_CLINIC_OR_DEPARTMENT_OTHER): Payer: Self-pay | Admitting: *Deleted

## 2011-05-19 ENCOUNTER — Emergency Department (HOSPITAL_BASED_OUTPATIENT_CLINIC_OR_DEPARTMENT_OTHER): Payer: Self-pay

## 2011-05-19 DIAGNOSIS — M79609 Pain in unspecified limb: Secondary | ICD-10-CM

## 2011-05-19 DIAGNOSIS — Z9889 Other specified postprocedural states: Secondary | ICD-10-CM

## 2011-05-19 DIAGNOSIS — I10 Essential (primary) hypertension: Secondary | ICD-10-CM

## 2011-05-19 DIAGNOSIS — R109 Unspecified abdominal pain: Secondary | ICD-10-CM

## 2011-05-19 DIAGNOSIS — R209 Unspecified disturbances of skin sensation: Secondary | ICD-10-CM | POA: Insufficient documentation

## 2011-05-19 DIAGNOSIS — M549 Dorsalgia, unspecified: Secondary | ICD-10-CM | POA: Insufficient documentation

## 2011-05-19 LAB — CBC
HCT: 41 % (ref 39.0–52.0)
MCH: 28.3 pg (ref 26.0–34.0)
MCV: 82.8 fL (ref 78.0–100.0)
Platelets: 229 10*3/uL (ref 150–400)
RBC: 4.95 MIL/uL (ref 4.22–5.81)
WBC: 6.5 10*3/uL (ref 4.0–10.5)

## 2011-05-19 LAB — DIFFERENTIAL
Eosinophils Absolute: 0.1 10*3/uL (ref 0.0–0.7)
Eosinophils Relative: 2 % (ref 0–5)
Lymphocytes Relative: 37 % (ref 12–46)
Lymphs Abs: 2.4 10*3/uL (ref 0.7–4.0)
Monocytes Absolute: 0.5 10*3/uL (ref 0.1–1.0)

## 2011-05-19 LAB — URINALYSIS, ROUTINE W REFLEX MICROSCOPIC
Bilirubin Urine: NEGATIVE
Nitrite: NEGATIVE
Protein, ur: 30 mg/dL — AB
Specific Gravity, Urine: 1.023 (ref 1.005–1.030)
Urobilinogen, UA: 0.2 mg/dL (ref 0.0–1.0)

## 2011-05-19 LAB — BASIC METABOLIC PANEL
BUN: 10 mg/dL (ref 6–23)
CO2: 27 mEq/L (ref 19–32)
Calcium: 9.7 mg/dL (ref 8.4–10.5)
Creatinine, Ser: 1 mg/dL (ref 0.50–1.35)
GFR calc non Af Amer: 89 mL/min — ABNORMAL LOW (ref >90)
Glucose, Bld: 85 mg/dL (ref 70–99)
Sodium: 138 mEq/L (ref 135–145)

## 2011-05-19 LAB — URINE MICROSCOPIC-ADD ON

## 2011-05-19 MED ORDER — DICLOFENAC SODIUM 75 MG PO TBEC: 75.0000 mg | DELAYED_RELEASE_TABLET | Freq: Two times a day (BID) | ORAL | Status: AC

## 2011-05-19 MED ORDER — HYDROMORPHONE HCL PF 1 MG/ML IJ SOLN
1.0000 mg | Freq: Once | INTRAMUSCULAR | Status: AC
  Administered 2011-05-19: 1 mg via INTRAVENOUS
  Filled 2011-05-19: qty 1

## 2011-05-19 MED ORDER — OXYCODONE-ACETAMINOPHEN 5-325 MG PO TABS: 1.0000 | ORAL_TABLET | ORAL | Status: AC | PRN

## 2011-05-19 MED ORDER — SODIUM CHLORIDE 0.9 % IV BOLUS (SEPSIS)
500.0000 mL | Freq: Once | INTRAVENOUS | Status: AC
  Administered 2011-05-19: 500 mL via INTRAVENOUS

## 2011-05-19 MED ORDER — METHOCARBAMOL 500 MG PO TABS: 500.0000 mg | ORAL_TABLET | Freq: Two times a day (BID) | ORAL | Status: AC

## 2011-05-19 MED ORDER — DIAZEPAM 5 MG/ML IJ SOLN
5.0000 mg | Freq: Once | INTRAMUSCULAR | Status: AC
  Administered 2011-05-19: 10 mg via INTRAVENOUS
  Filled 2011-05-19: qty 2

## 2011-05-19 MED ORDER — ONDANSETRON HCL 4 MG/2ML IJ SOLN
4.0000 mg | Freq: Once | INTRAMUSCULAR | Status: AC
  Administered 2011-05-19: 4 mg via INTRAVENOUS
  Filled 2011-05-19: qty 2

## 2011-05-19 NOTE — ED Provider Notes (Signed)
History     CSN: [id number]  Arrival date & time 05/19/11  1345  2:43 PM HPI Patient reports she was sitting at work when he suddenly developed right lower back pain. Reports pain begins midline and radiates all the way down his right lower extremity. Reports pain feels similar to when he required lumbar surgery in 2005. Denies any known injury, repetitive twisting, repetitive bending, or lifting heavy objects. Denies any urinary symptoms, nausea, vomiting, diarrhea, fever, numbness, tingling, weakness, incontinence, saddle anesthesias, perineal numbness. Patient is a 46 y.o. male presenting with back pain. The history is provided by the patient.  Back Pain  This is a new problem. The current episode started 1 to 2 hours ago. The problem occurs constantly. The problem has not changed since onset.The pain is associated with no known injury. The quality of the pain is described as stabbing and shooting. The pain is severe. Associated symptoms include leg pain, paresthesias and tingling. Pertinent negatives include no chest pain, no fever, no numbness, no headaches, no abdominal pain, no bowel incontinence, no perianal numbness, no bladder incontinence, no dysuria and no weakness.    History reviewed. No pertinent past medical history.  Past Surgical History  Procedure Date  . Back surgery     No family history on file.  History  Substance Use Topics  . Smoking status: Never Smoker   . Smokeless tobacco: Not on file  . Alcohol Use: No      Review of Systems  Constitutional: Negative for fever and chills.  HENT: Negative for neck pain.   Respiratory: Negative for cough and shortness of breath.   Cardiovascular: Negative for chest pain and palpitations.  Gastrointestinal: Negative for nausea, vomiting, abdominal pain and bowel incontinence.  Genitourinary: Negative for bladder incontinence, dysuria, urgency, frequency, hematuria, flank pain, penile pain and testicular pain.    Musculoskeletal: Positive for back pain. Negative for myalgias and gait problem.       Denies saddle anesthesias, perineal numbness, bowel incontinence, urinary incontinence  Neurological: Positive for tingling and paresthesias. Negative for dizziness, weakness, numbness and headaches.  All other systems reviewed and are negative.    Allergies  Review of patient's allergies indicates no known allergies.  Home Medications  No current outpatient prescriptions on file.  BP 180/120  Pulse 78  Temp(Src) 98 F (36.7 C) (Oral)  Resp 20  Ht 6\' 1"  (1.854 m)  Wt 270 lb (122.471 kg)  BMI 35.62 kg/m2  SpO2 100%  Physical Exam  Constitutional: He is oriented to person, place, and time. He appears well-developed and well-nourished.  HENT:  Head: Normocephalic and atraumatic.  Eyes: Conjunctivae are normal. Pupils are equal, round, and reactive to light.  Neck: Normal range of motion. Neck supple.  Cardiovascular: Normal rate, regular rhythm and normal heart sounds.   Pulmonary/Chest: Effort normal and breath sounds normal.  Abdominal: Soft. Bowel sounds are normal. He exhibits no distension and no mass. There is no hepatosplenomegaly. There is no tenderness. There is CVA tenderness (right sided). There is no rigidity, no rebound, no guarding, no tenderness at McBurney's point and negative Murphy's sign.  Musculoskeletal:       Lumbar back: He exhibits tenderness and bony tenderness. He exhibits normal range of motion and no swelling.       Back:       Patient has a positive straight leg raise on the right side. Normal sensation distally, normal pulses, pain with palpation of the lower lumbar back.  Neurological: He  is alert and oriented to person, place, and time.  Skin: Skin is warm and dry. No rash noted. No erythema. No pallor.  Psychiatric: He has a normal mood and affect. His behavior is normal.    ED Course  Procedures  Results for orders placed during the hospital encounter of  05/19/11  URINALYSIS, ROUTINE W REFLEX MICROSCOPIC      Component Value Range   Color, Urine YELLOW  YELLOW    APPearance CLEAR  CLEAR    Specific Gravity, Urine 1.023  1.005 - 1.030    pH 6.0  5.0 - 8.0    Glucose, UA NEGATIVE  NEGATIVE (mg/dL)   Hgb urine dipstick NEGATIVE  NEGATIVE    Bilirubin Urine NEGATIVE  NEGATIVE    Ketones, ur NEGATIVE  NEGATIVE (mg/dL)   Protein, ur 30 (*) NEGATIVE (mg/dL)   Urobilinogen, UA 0.2  0.0 - 1.0 (mg/dL)   Nitrite NEGATIVE  NEGATIVE    Leukocytes, UA NEGATIVE  NEGATIVE   URINE MICROSCOPIC-ADD ON      Component Value Range   Squamous Epithelial / LPF RARE  RARE    WBC, UA 0-2  <3 (WBC/hpf)   RBC / HPF 0-2  <3 (RBC/hpf)   Bacteria, UA RARE  RARE   CBC      Component Value Range   WBC 6.5  4.0 - 10.5 (K/uL)   RBC 4.95  4.22 - 5.81 (MIL/uL)   Hemoglobin 14.0  13.0 - 17.0 (g/dL)   HCT 86.5  78.4 - 69.6 (%)   MCV 82.8  78.0 - 100.0 (fL)   MCH 28.3  26.0 - 34.0 (pg)   MCHC 34.1  30.0 - 36.0 (g/dL)   RDW 29.5  28.4 - 13.2 (%)   Platelets 229  150 - 400 (K/uL)  DIFFERENTIAL      Component Value Range   Neutrophils Relative 54  43 - 77 (%)   Neutro Abs 3.5  1.7 - 7.7 (K/uL)   Lymphocytes Relative 37  12 - 46 (%)   Lymphs Abs 2.4  0.7 - 4.0 (K/uL)   Monocytes Relative 7  3 - 12 (%)   Monocytes Absolute 0.5  0.1 - 1.0 (K/uL)   Eosinophils Relative 2  0 - 5 (%)   Eosinophils Absolute 0.1  0.0 - 0.7 (K/uL)   Basophils Relative 0  0 - 1 (%)   Basophils Absolute 0.0  0.0 - 0.1 (K/uL)  BASIC METABOLIC PANEL      Component Value Range   Sodium 138  135 - 145 (mEq/L)   Potassium 4.0  3.5 - 5.1 (mEq/L)   Chloride 103  96 - 112 (mEq/L)   CO2 27  19 - 32 (mEq/L)   Glucose, Bld 85  70 - 99 (mg/dL)   BUN 10  6 - 23 (mg/dL)   Creatinine, Ser 4.40  0.50 - 1.35 (mg/dL)   Calcium 9.7  8.4 - 10.2 (mg/dL)   GFR calc non Af Amer 89 (*) >90 (mL/min)   GFR calc Af Amer >90  >90 (mL/min)   Ct Abdomen Pelvis Wo Contrast  05/19/2011  *RADIOLOGY REPORT*   Clinical Data: Right flank pain question ureterolithiasis, some pain down right leg, history of lumbar spine fusion in 2005  CT ABDOMEN AND PELVIS WITHOUT CONTRAST  Technique:  Multidetector CT imaging of the abdomen and pelvis was performed following the standard protocol without intravenous contrast. Sagittal and coronal MPR images reconstructed from axial data set.  Comparison: 05/30/2007  Findings:  5 mm diameter nonspecific nodule left lung base image 8 unchanged; stability since 2009 is indicative of a benign process. Artifacts at lesser curve of stomach, uncertain etiology. No urinary tract calcification, hydronephrosis or ureteral dilatation. Within limits of a nonenhanced exam, no focal abnormalities of the liver, spleen, pancreas, kidneys, and adrenal glands. Normal appendix. Stomach and bowel loops grossly unremarkable for technique. Pelvic phleboliths. No mass, adenopathy, free fluid, or inflammatory process. No hernia or acute bony lesion. Prior L4-L5 fusion.  IMPRESSION: No acute intra abdominal or intrapelvic abnormalities.  Original Report Authenticated By: Lollie Marrow, M.D.     MDM    Discussed with patient and family we will treat for lower back pain patient likely has sciatica pain. Advised close followup with neurosurgeon if pain persists. Discussed return to ED for worsening symptoms gave a list. Also recheck blood pressure patient continues to be significantly hypertensive with blood pressure 189/115. Advised diet and exercise. Also will refer patient to primary care physician for further evaluation and management if needed until pressures not controlled. Patient and spouse agreeable plan and are ready for discharge.   Medical screening examination/treatment/procedure(s) were performed by non-physician practitioner and as supervising physician I was immediately available for consultation/collaboration. Osvaldo Human, M.D.   Thomasene Lot, PA-C 05/19/11 1700  Carleene Cooper  III, MD 05/20/11 [id number]

## 2011-05-19 NOTE — ED Notes (Signed)
Lower back pain sudden onset today while sitting. Pain radiates into his right leg. Hx of back surgery in 2005.

## 2013-09-20 ENCOUNTER — Encounter: Payer: Self-pay | Admitting: Emergency Medicine

## 2013-09-20 ENCOUNTER — Ambulatory Visit (INDEPENDENT_AMBULATORY_CARE_PROVIDER_SITE_OTHER): Payer: No Typology Code available for payment source | Admitting: Sports Medicine

## 2013-09-20 ENCOUNTER — Emergency Department (INDEPENDENT_AMBULATORY_CARE_PROVIDER_SITE_OTHER)
Admission: EM | Admit: 2013-09-20 | Discharge: 2013-09-20 | Disposition: A | Discharge status: Other Acute Inpatient Hospital | Payer: No Typology Code available for payment source | Source: Home / Self Care | Attending: Family Medicine | Admitting: Family Medicine

## 2013-09-20 ENCOUNTER — Emergency Department (INDEPENDENT_AMBULATORY_CARE_PROVIDER_SITE_OTHER): Payer: No Typology Code available for payment source

## 2013-09-20 DIAGNOSIS — R9431 Abnormal electrocardiogram [ECG] [EKG]: Secondary | ICD-10-CM

## 2013-09-20 DIAGNOSIS — M25519 Pain in unspecified shoulder: Secondary | ICD-10-CM

## 2013-09-20 DIAGNOSIS — I10 Essential (primary) hypertension: Secondary | ICD-10-CM

## 2013-09-20 DIAGNOSIS — M7500 Adhesive capsulitis of unspecified shoulder: Secondary | ICD-10-CM

## 2013-09-20 DIAGNOSIS — I161 Hypertensive emergency: Secondary | ICD-10-CM

## 2013-09-20 DIAGNOSIS — M7501 Adhesive capsulitis of right shoulder: Secondary | ICD-10-CM | POA: Insufficient documentation

## 2013-09-20 HISTORY — DX: Essential (primary) hypertension: I10

## 2013-09-20 MED ORDER — HYDROCODONE-ACETAMINOPHEN 5-325 MG PO TABS: 1.0000 | ORAL_TABLET | Freq: Three times a day (TID) | ORAL | Status: DC | PRN

## 2013-09-20 NOTE — Progress Notes (Signed)
   Subjective:    I'm seeing this patient as a consultation for:  Dr. Ernestina Patches  CC: Right shoulder pain  HPI: This is a pleasant 48 year old male, 2 weeks prior he had an upper respiratory infection, with sniffles and a sore throat, this resolved and then over the next several days he developed increasing pain, and decreased range of motion in his right shoulder to the point now where the pain is excruciating. His localized anteriorly at the joint line, without radiation. Persistent.  Past medical history, Surgical history, Family history not pertinant except as noted below, Social history, Allergies, and medications have been entered into the medical record, reviewed, and no changes needed.   Review of Systems: No headache, visual changes, nausea, vomiting, diarrhea, constipation, dizziness, abdominal pain, skin rash, fevers, chills, night sweats, weight loss, swollen lymph nodes, body aches, joint swelling, muscle aches, chest pain, shortness of breath, mood changes, visual or auditory hallucinations.   Objective:   General: Well Developed, well nourished, and in no acute distress.  Neuro/Psych: Alert and oriented x3, extra-ocular muscles intact, able to move all 4 extremities, sensation grossly intact. Skin: Warm and dry, no rashes noted.  Respiratory: Not using accessory muscles, speaking in full sentences, trachea midline.  Cardiovascular: Pulses palpable, no extremity edema. Abdomen: Does not appear distended. Right shoulder: Very limited range of motion particularly to external rotation, with range to 5 external rotation with a solid end point on passive range of motion.  Procedure: Real-time Ultrasound Guided Injection of right shoulder Device: GE Logiq E  Verbal informed consent obtained.  Time-out conducted.  Noted no overlying erythema, induration, or other signs of local infection.  Skin prepped in a sterile fashion.  Local anesthesia: Topical Ethyl chloride.  With sterile  technique and under real time ultrasound guidance:  Spinal needle advanced into the glenohumeral joint, 1 cc Kenalog 40, 5 cc lidocaine injected easily. Completed without difficulty  Pain immediately resolved suggesting accurate placement of the medication.  Advised to call if fevers/chills, erythema, induration, drainage, or persistent bleeding.  Images permanently stored and available for review in the ultrasound unit.  Impression: Technically successful ultrasound guided injection.  X-rays were negative.  Impression and Recommendations:   This case required medical decision making of moderate complexity.

## 2013-09-20 NOTE — Assessment & Plan Note (Signed)
We caught this early. Ultrasound-guided glenohumeral joint injection, and capsular dilatation. Hydrocodone for pain, return to see me in one month.

## 2013-09-20 NOTE — ED Notes (Signed)
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

## 2013-09-20 NOTE — ED Provider Notes (Addendum)
CSN: [id number]     Arrival date & time 09/20/13  1413 History   None    Chief Complaint  Patient presents with  . Shoulder Pain    HPI  R shoulder pain x 3-4 days  Had severe R shoulder pain with generalized weakness over last 3-4 days that has progressively worsened.  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  No fevers or chills.  Pain has progressed to point that he cannot lift arm + radicular sxs down R arm.  Has had some mild nausea and R sided CP over the course of the week.  None today.   Past Medical History  Diagnosis Date  . Hypertension    Past Surgical History  Procedure Laterality Date  . Back surgery     No family history on file. History  Substance Use Topics  . Smoking status: Never Smoker   . Smokeless tobacco: Not on file  . Alcohol Use: No    Review of Systems  All other systems reviewed and are negative.   Allergies  Review of patient's allergies indicates no known allergies.  Home Medications   Prior to Admission medications   Not on File   BP 204/127  Pulse 92  Temp(Src) 98 F (36.7 C) (Oral)  Resp 16  Ht 6\' 1"  (1.854 m)  Wt 280 lb (127.007 kg)  BMI 36.95 kg/m2  SpO2 97% Physical Exam  Constitutional: He appears well-developed and well-nourished.  HENT:  Head: Normocephalic and atraumatic.  Eyes: Conjunctivae are normal. Pupils are equal, round, and reactive to light.  Neck: Normal range of motion. Neck supple.  Cardiovascular: Normal rate and regular rhythm.   Pulmonary/Chest: Effort normal.  Abdominal: Soft.  Musculoskeletal:       Arms: Neurological: He is alert.  Skin: Skin is warm.    ED Course  Procedures (including critical care time) Labs Review Labs Reviewed - No data to display  Imaging Review Dg Shoulder Right  09/20/2013   CLINICAL DATA:  Right shoulder pain for 3-4 days.  No known injury.  EXAM: RIGHT SHOULDER - 2+ VIEW   COMPARISON:  None.  FINDINGS: There is no evidence of fracture or dislocation. There is no evidence of arthropathy or other focal bone abnormality. Soft tissues are unremarkable.  IMPRESSION: Negative.   Electronically Signed   By: Rolla Flatten M.D.   On: 09/20/2013 15:06     MDM   1. Pain in joint, shoulder region   2. HTN (hypertension)    Xrays negative for fracture or dislocation.  Ddx includes rotator cuff strain, adehesive capsulitis.  Will consult sports medicine.  Treatment and follow up of shoulder pain per sports medicine recs.   Noted markedly elevated BP in setting of pain.  No CP, SOB, HA currently.  Asymptomatic.  EKG with noted T wave inversions in lateral leads.  I do not have an old EKG to compare this to.  Repeat BP 197/120 Will send pt to ER via EMS for ACS rule out and hypertensive emergency.  Supplemental O2 placed Full dose ASA given  Pt refused NTG.   > 50% of greater than 60 minutes spent with pt in terms of direct pt care and care coordination.     Shanda Howells, MD 09/20/13 1603  Shanda Howells, MD 09/20/13 [phone]

## 2013-09-23 ENCOUNTER — Telehealth: Payer: Self-pay

## 2013-09-23 NOTE — ED Notes (Signed)
Left a message on voice mail asking how patient is feeling and advising to call back with any questions or concerns.  

## 2013-10-29 ENCOUNTER — Ambulatory Visit (INDEPENDENT_AMBULATORY_CARE_PROVIDER_SITE_OTHER): Payer: No Typology Code available for payment source | Admitting: Sports Medicine

## 2013-10-29 ENCOUNTER — Encounter: Payer: Self-pay | Admitting: Sports Medicine

## 2013-10-29 VITALS — BP 155/86 | HR 88 | Ht 73.0 in | Wt 279.0 lb

## 2013-10-29 DIAGNOSIS — M7501 Adhesive capsulitis of right shoulder: Secondary | ICD-10-CM | POA: Insufficient documentation

## 2013-10-29 DIAGNOSIS — M51369 Other intervertebral disc degeneration, lumbar region without mention of lumbar back pain or lower extremity pain: Secondary | ICD-10-CM | POA: Insufficient documentation

## 2013-10-29 DIAGNOSIS — Z299 Encounter for prophylactic measures, unspecified: Secondary | ICD-10-CM

## 2013-10-29 DIAGNOSIS — M5136 Other intervertebral disc degeneration, lumbar region: Secondary | ICD-10-CM

## 2013-10-29 DIAGNOSIS — I10 Essential (primary) hypertension: Secondary | ICD-10-CM

## 2013-10-29 DIAGNOSIS — M7541 Impingement syndrome of right shoulder: Secondary | ICD-10-CM

## 2013-10-29 DIAGNOSIS — M51379 Other intervertebral disc degeneration, lumbosacral region without mention of lumbar back pain or lower extremity pain: Secondary | ICD-10-CM

## 2013-10-29 DIAGNOSIS — M7580 Other shoulder lesions, unspecified shoulder: Secondary | ICD-10-CM

## 2013-10-29 DIAGNOSIS — M7500 Adhesive capsulitis of unspecified shoulder: Secondary | ICD-10-CM

## 2013-10-29 DIAGNOSIS — M25819 Other specified joint disorders, unspecified shoulder: Secondary | ICD-10-CM

## 2013-10-29 DIAGNOSIS — Z0000 Encounter for general adult medical examination without abnormal findings: Secondary | ICD-10-CM | POA: Insufficient documentation

## 2013-10-29 DIAGNOSIS — R911 Solitary pulmonary nodule: Secondary | ICD-10-CM

## 2013-10-29 DIAGNOSIS — M5137 Other intervertebral disc degeneration, lumbosacral region: Secondary | ICD-10-CM

## 2013-10-29 HISTORY — DX: Other intervertebral disc degeneration, lumbar region: M51.36

## 2013-10-29 HISTORY — DX: Other intervertebral disc degeneration, lumbar region without mention of lumbar back pain or lower extremity pain: M51.369

## 2013-10-29 MED ORDER — MELOXICAM 15 MG PO TABS: ORAL_TABLET | ORAL | Status: DC

## 2013-10-29 MED ORDER — LOSARTAN POTASSIUM-HCTZ 100-25 MG PO TABS: 1.0000 | ORAL_TABLET | Freq: Every day | ORAL | Status: DC

## 2013-10-29 NOTE — Assessment & Plan Note (Signed)
Switching to losartan/hydrochlorothiazide.

## 2013-10-29 NOTE — Progress Notes (Signed)
  Subjective:    CC: Follow up and establish care  HPI: Right adhesive capsulitis: Resolved after glenohumeral injection a month ago.  Right shoulder pain: Localized over the deltoid, worse with overhead activities. Moderate, persistent.  Hypertension: Was given losartan urgent care, blood pressure continues to be elevated, no visual problems, no headaches, no chest pain.  Lumbar degenerative disc disease: Tells me he has had a discectomy in 2005, he is unaware what level this was performed, and he continues to have back pain with radicular symptoms down the right leg.  Past medical history, Surgical history, Family history not pertinant except as noted below, Social history, Allergies, and medications have been entered into the medical record, reviewed, and no changes needed.   Review of Systems: No fevers, chills, night sweats, weight loss, chest pain, or shortness of breath.   Objective:    General: Well Developed, well nourished, and in no acute distress.  Neuro: Alert and oriented x3, extra-ocular muscles intact, sensation grossly intact.  HEENT: Normocephalic, atraumatic, pupils equal round reactive to light, neck supple, no masses, no lymphadenopathy, thyroid nonpalpable.  Skin: Warm and dry, no rashes. Cardiac: Regular rate and rhythm, no murmurs rubs or gallops, no lower extremity edema.  Respiratory: Clear to auscultation bilaterally. Not using accessory muscles, speaking in full sentences. Right shoulder: Inspection reveals no abnormalities, atrophy or asymmetry. Palpation is normal with no tenderness over AC joint or bicipital groove. ROM is full in all planes. Rotator cuff strength normal throughout. Positive Neer and Hawkin's tests, empty can sign. Speeds and Yergason's tests normal. No labral pathology noted with negative Obrien's, negative clunk and good stability. Normal scapular function observed. No painful arc and no drop arm sign. No apprehension  sign  Impression and Recommendations:

## 2013-10-29 NOTE — Assessment & Plan Note (Signed)
Status post lumbar discectomy. Unfortunately still with radicular symptoms, I have no records which he will obtain for me. Until then we are going to obtain a new MRI with IV contrast.

## 2013-10-29 NOTE — Assessment & Plan Note (Signed)
Discontinue naproxen, switching to Mobic, formal physical therapy. Return in a month, subacromial injection if no better.

## 2013-10-29 NOTE — Assessment & Plan Note (Addendum)
Patient will get CT report from Richard Beltran. It does appear to be a 3 and 5 cm pulmonary nodule that needs a one-year followup. Next CT chest will be due in June of 2016

## 2013-10-29 NOTE — Assessment & Plan Note (Signed)
We will discuss this in more detail at a future visit

## 2013-10-29 NOTE — Assessment & Plan Note (Signed)
Resolved with glenohumeral injection a month ago.

## 2013-10-31 ENCOUNTER — Telehealth: Payer: Self-pay | Admitting: *Deleted

## 2013-10-31 NOTE — Telephone Encounter (Signed)
MRI lumbar spine w/wo approved from [facility] via fax [phone] good from 10/31/13-12/01/13. Radiology notified. Margette Fast, CMA

## 2013-11-05 ENCOUNTER — Telehealth: Payer: Self-pay | Admitting: *Deleted

## 2013-11-05 MED ORDER — DIAZEPAM 5 MG PO TABS: ORAL_TABLET | ORAL | Status: DC

## 2013-11-05 NOTE — Telephone Encounter (Signed)
rx for valium for use 2h before mri is in my box, he needs a driver and may want to take both tabs 2h before mri.

## 2013-11-05 NOTE — Telephone Encounter (Signed)
Patient is clausterphobic and requested a pre-med prior to MRI. Please advise. Margette Fast, CMA

## 2013-11-06 ENCOUNTER — Encounter: Payer: Self-pay | Admitting: Sports Medicine

## 2013-11-06 ENCOUNTER — Other Ambulatory Visit: Payer: Self-pay

## 2013-11-06 DIAGNOSIS — E291 Testicular hypofunction: Secondary | ICD-10-CM | POA: Insufficient documentation

## 2013-11-06 LAB — CBC
HCT: 40.7 % (ref 39.0–52.0)
Hemoglobin: 13.4 g/dL (ref 13.0–17.0)
MCH: 28.2 pg (ref 26.0–34.0)
MCHC: 32.9 g/dL (ref 30.0–36.0)
MCV: 85.7 fL (ref 78.0–100.0)
Platelets: 190 10*3/uL (ref 150–400)
RBC: 4.75 MIL/uL (ref 4.22–5.81)
RDW: 13.7 % (ref 11.5–15.5)
WBC: 7.4 10*3/uL (ref 4.0–10.5)

## 2013-11-06 LAB — LIPID PANEL
Cholesterol: 192 mg/dL (ref 0–200)
HDL: 47 mg/dL (ref >39)
LDL Cholesterol: 121 mg/dL — ABNORMAL HIGH (ref 0–99)
Total CHOL/HDL Ratio: 4.1 Ratio
Triglycerides: 121 mg/dL (ref <150)
VLDL: 24 mg/dL (ref 0–40)

## 2013-11-06 LAB — BASIC METABOLIC PANEL
BUN: 19 mg/dL (ref 6–23)
CO2: 32 mEq/L (ref 19–32)
Calcium: 9.4 mg/dL (ref 8.4–10.5)
Chloride: 101 mEq/L (ref 96–112)
Creat: 1.06 mg/dL (ref 0.50–1.35)
Glucose, Bld: 96 mg/dL (ref 70–99)
Potassium: 3.7 mEq/L (ref 3.5–5.3)

## 2013-11-06 LAB — TSH: TSH: 1.523 u[IU]/mL (ref 0.350–4.500)

## 2013-11-06 LAB — TESTOSTERONE: Testosterone: 277 ng/dL — ABNORMAL LOW (ref 300–890)

## 2013-11-06 LAB — HEMOGLOBIN A1C
Hgb A1c MFr Bld: 5.5 % (ref <5.7)
Mean Plasma Glucose: 111 mg/dL (ref <117)

## 2013-11-06 LAB — BASIC METABOLIC PANEL WITH GFR: Sodium: 140 meq/L (ref 135–145)

## 2013-11-06 NOTE — Telephone Encounter (Signed)
Several attempts to contact patient today without success. Mailbox is full and unable to receive msgs. Patient's MRI is scheduled for 11/07/13 @ 1230. Pt is not aware at this time regarding the valium at the pharmacy. Margette Fast, CMA

## 2013-11-07 ENCOUNTER — Ambulatory Visit (INDEPENDENT_AMBULATORY_CARE_PROVIDER_SITE_OTHER): Payer: No Typology Code available for payment source

## 2013-11-07 DIAGNOSIS — M5124 Other intervertebral disc displacement, thoracic region: Secondary | ICD-10-CM

## 2013-11-07 DIAGNOSIS — M5126 Other intervertebral disc displacement, lumbar region: Secondary | ICD-10-CM

## 2013-11-07 MED ORDER — GADOBENATE DIMEGLUMINE 529 MG/ML IV SOLN: 20.0000 mL | Freq: Once | INTRAVENOUS | Status: AC | PRN

## 2013-11-09 ENCOUNTER — Ambulatory Visit: Payer: No Typology Code available for payment source | Admitting: Sports Medicine

## 2013-11-12 ENCOUNTER — Encounter: Payer: Self-pay | Admitting: Sports Medicine

## 2013-11-12 ENCOUNTER — Ambulatory Visit (INDEPENDENT_AMBULATORY_CARE_PROVIDER_SITE_OTHER): Payer: No Typology Code available for payment source | Admitting: Sports Medicine

## 2013-11-12 VITALS — BP 200/120 | HR 94 | Ht 73.0 in | Wt 282.0 lb

## 2013-11-12 DIAGNOSIS — M5137 Other intervertebral disc degeneration, lumbosacral region: Secondary | ICD-10-CM

## 2013-11-12 DIAGNOSIS — M7580 Other shoulder lesions, unspecified shoulder: Secondary | ICD-10-CM

## 2013-11-12 DIAGNOSIS — M51379 Other intervertebral disc degeneration, lumbosacral region without mention of lumbar back pain or lower extremity pain: Secondary | ICD-10-CM

## 2013-11-12 DIAGNOSIS — M7541 Impingement syndrome of right shoulder: Secondary | ICD-10-CM

## 2013-11-12 DIAGNOSIS — I10 Essential (primary) hypertension: Secondary | ICD-10-CM

## 2013-11-12 DIAGNOSIS — M51369 Other intervertebral disc degeneration, lumbar region without mention of lumbar back pain or lower extremity pain: Secondary | ICD-10-CM

## 2013-11-12 DIAGNOSIS — M5136 Other intervertebral disc degeneration, lumbar region: Secondary | ICD-10-CM

## 2013-11-12 DIAGNOSIS — E291 Testicular hypofunction: Secondary | ICD-10-CM

## 2013-11-12 DIAGNOSIS — M25819 Other specified joint disorders, unspecified shoulder: Secondary | ICD-10-CM

## 2013-11-12 MED ORDER — TESTOSTERONE CYPIONATE 200 MG/ML IM SOLN
200.0000 mg | INTRAMUSCULAR | Status: DC
  Administered 2013-11-12: 200 mg via INTRAMUSCULAR

## 2013-11-12 MED ORDER — CARVEDILOL 12.5 MG PO TABS: 12.5000 mg | ORAL_TABLET | Freq: Two times a day (BID) | ORAL | Status: DC

## 2013-11-12 MED ORDER — GABAPENTIN 300 MG PO CAPS: ORAL_CAPSULE | ORAL | Status: DC

## 2013-11-12 NOTE — Assessment & Plan Note (Signed)
Persistent right-sided L5 radicular symptoms after an L4-L5 fusion. 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 We're going to proceed with a right-sided L5-S1 transforaminal epidural. We are also going to start gabapentin. Return in 2 weeks to evaluate response.

## 2013-11-12 NOTE — Assessment & Plan Note (Signed)
Persistent pain despite conservative measures. Strongly positive Hawkins test. Subacromial injection as above.

## 2013-11-12 NOTE — Assessment & Plan Note (Signed)
Currently on losartan/hydrochlorothiazide. Adding carvedilol. Return in 2 weeks to recheck.

## 2013-11-12 NOTE — Progress Notes (Signed)
  Subjective:    CC: Followup multiple issues  HPI: Hypertension: Continues to be elevated despite losartan/hydrochlorothiazide, no headaches, visual changes, chest pain.  Low back pain: Recently had his MRI with IV contrast, he is post L4-L5 fusion which looks good, unfortunately continuing to have pain coming down his right leg, back of the lower leg, to the very bottom of his foot. He tells me that this feels the same as the radicular symptoms he had before the surgery. Post surgery he had temporary resolution of this pain.  Right shoulder pain: Adhesive capsulitis resolved with injection several months ago, unfortunately he now has pain he localizes over the deltoid, worse with overhead activities, severe, persistent.  Past medical history, Surgical history, Family history not pertinant except as noted below, Social history, Allergies, and medications have been entered into the medical record, reviewed, and no changes needed.   Review of Systems: No fevers, chills, night sweats, weight loss, chest pain, or shortness of breath.   Objective:    General: Well Developed, well nourished, and in no acute distress.  Neuro: Alert and oriented x3, extra-ocular muscles intact, sensation grossly intact.  HEENT: Normocephalic, atraumatic, pupils equal round reactive to light, neck supple, no masses, no lymphadenopathy, thyroid nonpalpable.  Skin: Warm and dry, no rashes. Cardiac: Regular rate and rhythm, no murmurs rubs or gallops, no lower extremity edema.  Respiratory: Clear to auscultation bilaterally. Not using accessory muscles, speaking in full sentences. Right Shoulder: Inspection reveals no abnormalities, atrophy or asymmetry. Palpation is normal with no tenderness over AC joint or bicipital groove. ROM is full in all planes. Rotator cuff strength normal throughout. Positive Neer and Hawkin's tests, empty can. Speeds and Yergason's tests normal. No labral pathology noted with negative  Obrien's, negative crank, negative clunk, and good stability. Normal scapular function observed. No painful arc and no drop arm sign. No apprehension sign  Procedure: Real-time Ultrasound Guided Injection of right subacromial bursa Device: GE Logiq E  Verbal informed consent obtained.  Time-out conducted.  Noted no overlying erythema, induration, or other signs of local infection.  Skin prepped in a sterile fashion.  Local anesthesia: Topical Ethyl chloride.  With sterile technique and under real time ultrasound guidance:  1 cc Kenalog 40, 3 cc lidocaine injected easily. Completed without difficulty  Pain immediately resolved suggesting accurate placement of the medication.  Advised to call if fevers/chills, erythema, induration, drainage, or persistent bleeding.  Images permanently stored and available for review in the ultrasound unit.  Impression: Technically successful ultrasound guided injection.  Impression and Recommendations:    I spent 40 minutes with this patient, greater than 50% was face-to-face time counseling regarding the above diagnoses.

## 2013-11-12 NOTE — Assessment & Plan Note (Signed)
Discussed options, risks, benefits. Testosterone 200 mg intramuscular. Return in 2 weeks for injection #2. Then check testosterone levels one week after that.

## 2013-11-14 ENCOUNTER — Ambulatory Visit: Payer: No Typology Code available for payment source | Admitting: Physical Therapy

## 2013-11-20 ENCOUNTER — Ambulatory Visit
Admission: RE | Admit: 2013-11-20 | Discharge: 2013-11-20 | Disposition: A | Discharge status: Home / Self Care | Payer: No Typology Code available for payment source | Source: Ambulatory Visit | Attending: Sports Medicine | Admitting: Sports Medicine

## 2013-11-20 VITALS — BP 179/110 | HR 61

## 2013-11-20 DIAGNOSIS — M5136 Other intervertebral disc degeneration, lumbar region: Secondary | ICD-10-CM

## 2013-11-20 MED ORDER — IOHEXOL 180 MG/ML  SOLN
1.0000 mL | Freq: Once | INTRAMUSCULAR | Status: AC | PRN
  Administered 2013-11-20: 1 mL via EPIDURAL

## 2013-11-20 MED ORDER — METHYLPREDNISOLONE ACETATE 40 MG/ML INJ SUSP (RADIOLOG
120.0000 mg | Freq: Once | INTRAMUSCULAR | Status: AC
  Administered 2013-11-20: 120 mg via EPIDURAL

## 2013-11-20 MED ORDER — DIAZEPAM 5 MG PO TABS
10.0000 mg | ORAL_TABLET | Freq: Once | ORAL | Status: AC
  Administered 2013-11-20: 10 mg via ORAL

## 2013-11-20 NOTE — Discharge Instructions (Signed)

## 2013-11-26 ENCOUNTER — Encounter: Payer: Self-pay | Admitting: Sports Medicine

## 2013-11-26 ENCOUNTER — Ambulatory Visit (INDEPENDENT_AMBULATORY_CARE_PROVIDER_SITE_OTHER): Payer: No Typology Code available for payment source | Admitting: Sports Medicine

## 2013-11-26 VITALS — BP 176/100 | HR 72 | Ht 73.0 in | Wt 281.0 lb

## 2013-11-26 DIAGNOSIS — M51369 Other intervertebral disc degeneration, lumbar region without mention of lumbar back pain or lower extremity pain: Secondary | ICD-10-CM

## 2013-11-26 DIAGNOSIS — M5136 Other intervertebral disc degeneration, lumbar region: Secondary | ICD-10-CM

## 2013-11-26 DIAGNOSIS — I10 Essential (primary) hypertension: Secondary | ICD-10-CM

## 2013-11-26 DIAGNOSIS — M25819 Other specified joint disorders, unspecified shoulder: Secondary | ICD-10-CM

## 2013-11-26 DIAGNOSIS — M7580 Other shoulder lesions, unspecified shoulder: Secondary | ICD-10-CM

## 2013-11-26 DIAGNOSIS — M7541 Impingement syndrome of right shoulder: Secondary | ICD-10-CM

## 2013-11-26 DIAGNOSIS — E291 Testicular hypofunction: Secondary | ICD-10-CM

## 2013-11-26 MED ORDER — TRAMADOL HCL 50 MG PO TABS: ORAL_TABLET | ORAL | Status: DC

## 2013-11-26 MED ORDER — OLMESARTAN-AMLODIPINE-HCTZ 40-10-25 MG PO TABS: 1.0000 | ORAL_TABLET | Freq: Every day | ORAL | Status: DC

## 2013-11-26 MED ORDER — TESTOSTERONE CYPIONATE 200 MG/ML IM SOLN
200.0000 mg | INTRAMUSCULAR | Status: DC
  Administered 2013-11-26: 200 mg via INTRAMUSCULAR

## 2013-11-26 NOTE — Assessment & Plan Note (Signed)
Testosterone injection #2. Recheck levels in a week. Not feeling a response yet.

## 2013-11-26 NOTE — Progress Notes (Signed)
Subjective:    CC: Shoulder/back pain, elevated bp, low energy  HPI: Patient is a 48 year old male with history of hypertension, fatigue, back pain, radicular pain, and shoulder pain, who comes to the clinic today to follow up on those conditions.   Hypertension: patient states that he has been educated about long term effects of high blood pressure and considers managing his blood pressure a priority. He has been working on cutting salt out of his diet and taking his medication every day.   Fatigue: patient was diagnosed with low testosterone and given his first injection 2 weeks ago. He has not noticed any changes. He still feels tired and low-energy. He says that he has been drinking a shot of espresso most mornings, and this has helped.  Shoulder: Patient says that he thinks there has been some improvement, but not very much. His last shoulder injection lasted about 5 days and then the pain returned. Pain is worse with overhead activities and is as bad as 7-8 on a bad day.   Back: patient received an L5-S1 epidural for his back several days ago; his back pain has since resolved, but he retains a radicular pain running down the posterior of his right leg and into the sole of his foot. This pain is worse with bending and coughing. He says that the epidural made no difference at any point with these radicular symptoms.  Past medical history, Surgical history, Family history not pertinant except as noted below, Social history, Allergies, and medications have been entered into the medical record, reviewed, and no changes needed.   Review of Systems: No fevers, chills, night sweats, weight loss, chest pain, or shortness of breath.   Objective:    General: Well Developed, well nourished, and in no acute distress.  Neuro: Alert and oriented x3, extra-ocular muscles intact, sensation grossly intact.  HEENT: Normocephalic, atraumatic, pupils equal round reactive to light, neck supple, no masses, no  lymphadenopathy, thyroid nonpalpable.  Skin: Warm and dry, no rashes. Cardiac: Regular rate and rhythm, no murmurs rubs or gallops, no lower extremity edema.  Respiratory: Clear to auscultation bilaterally. Not using accessory muscles, speaking in full sentences. Right Shoulder: Inspection reveals no abnormalities, atrophy or asymmetry. Palpation shows tenderness over AC joint. ROM is full in all planes. Rotator cuff strength weak Positive Hawkin's tests, empty can sign. Speeds and Yergason's tests normal. No labral pathology noted with negative Obrien's Normal scapular function observed.  Procedure:  Injection of right subacromial bursa Consent obtained and verified. Time-out conducted. Noted no overlying erythema, induration, or other signs of local infection. Skin prepped in a sterile fashion. Topical analgesic spray: Ethyl chloride. Completed without difficulty. Meds: 1 cc kenalog 40, 4 cc lidocaine injected easily from a posterior approach. Pain immediately improved suggesting accurate placement of the medication. Advised to call if fevers/chills, erythema, induration, drainage, or persistent bleeding.  Impression and Recommendations:    Hypertension: still not adequately controlled, prescribed a combination of amlodipine, HCTZ and losartan to take in addition to his carvedilol, discontinued his other blood pressure medications. Asked to follow up in 2 weeks.  Fatigue: so far no response to testosterone. We gave him another shot today, and we will measure his testosterone in a week to see if it is within the therapeutic range.  Back/radicular symptoms: suggestive of S1 nerve root pathology, we recommended another epidural targeting the S1 root.  Shoulder pain: suggestive of rotator cuff pathology; we prescribed physical therapy, tramadol for breakthrough pain, and gave  him another steroid injection into the subacromial bursa.

## 2013-11-26 NOTE — Assessment & Plan Note (Signed)
Improved persistently elevated. Switching toTribenzor max dose. Continue carvedilol. Return in 2 weeks.

## 2013-11-26 NOTE — Assessment & Plan Note (Signed)
Did well with resolution of axial pain with an L5-S1 transforaminal epidural. Still having some radicular symptoms down the right leg, at this point we are going to try a selective right-sided S1 nerve root block. Did not have any resolution, not even temporarily to his radicular symptoms after the L5-S1 transforaminal epidural but happy that his axial pain has resolved. Return one month afterwards to go over response.

## 2013-11-26 NOTE — Assessment & Plan Note (Signed)
Repeat subacromial injection. Physical therapy times one visit to learn rehabilitation exercises. And tramadol for breakthrough pain. Return in one month for this.

## 2013-11-30 ENCOUNTER — Ambulatory Visit: Payer: No Typology Code available for payment source

## 2013-12-03 ENCOUNTER — Ambulatory Visit (INDEPENDENT_AMBULATORY_CARE_PROVIDER_SITE_OTHER): Payer: No Typology Code available for payment source | Admitting: Sports Medicine

## 2013-12-03 VITALS — BP 144/90 | HR 70 | Temp 98.3°F | Ht 73.0 in | Wt 279.0 lb

## 2013-12-03 DIAGNOSIS — E291 Testicular hypofunction: Secondary | ICD-10-CM

## 2013-12-03 MED ORDER — TESTOSTERONE CYPIONATE 200 MG/ML IM SOLN
200.0000 mg | Freq: Once | INTRAMUSCULAR | Status: AC
  Administered 2013-12-03: 200 mg via INTRAMUSCULAR

## 2013-12-03 NOTE — Progress Notes (Signed)
Pt came in today for his testosterone injection and tolerated well. Pt received injection in the RUOQ. Denies chest pain, ha, SOB or mood changes./Richard Beltran,CMA

## 2013-12-03 NOTE — Assessment & Plan Note (Signed)
Testosterone injection as above. 

## 2013-12-10 ENCOUNTER — Ambulatory Visit
Admission: RE | Admit: 2013-12-10 | Discharge: 2013-12-10 | Disposition: A | Discharge status: Home / Self Care | Payer: No Typology Code available for payment source | Source: Ambulatory Visit | Attending: Sports Medicine | Admitting: Sports Medicine

## 2013-12-10 VITALS — BP 171/92 | HR 69

## 2013-12-10 DIAGNOSIS — M5136 Other intervertebral disc degeneration, lumbar region: Secondary | ICD-10-CM

## 2013-12-10 MED ORDER — METHYLPREDNISOLONE ACETATE 40 MG/ML INJ SUSP (RADIOLOG
120.0000 mg | Freq: Once | INTRAMUSCULAR | Status: AC
  Administered 2013-12-10: 120 mg via EPIDURAL

## 2013-12-10 MED ORDER — IOHEXOL 180 MG/ML  SOLN
1.0000 mL | Freq: Once | INTRAMUSCULAR | Status: AC | PRN
  Administered 2013-12-10: 1 mL via EPIDURAL

## 2013-12-10 MED ORDER — DIAZEPAM 5 MG PO TABS
10.0000 mg | ORAL_TABLET | Freq: Once | ORAL | Status: AC
Start: 2013-12-10 — End: 2013-12-10
  Administered 2013-12-10: 10 mg via ORAL

## 2013-12-10 NOTE — Discharge Instructions (Signed)

## 2013-12-11 ENCOUNTER — Ambulatory Visit: Payer: No Typology Code available for payment source | Admitting: Sports Medicine

## 2013-12-11 DIAGNOSIS — Z0289 Encounter for other administrative examinations: Secondary | ICD-10-CM

## 2013-12-15 LAB — TESTOSTERONE: Testosterone: 374 ng/dL (ref 300–890)

## 2013-12-17 ENCOUNTER — Ambulatory Visit (INDEPENDENT_AMBULATORY_CARE_PROVIDER_SITE_OTHER): Payer: No Typology Code available for payment source | Admitting: Sports Medicine

## 2013-12-17 VITALS — BP 158/97 | HR 74 | Ht 73.0 in | Wt 281.0 lb

## 2013-12-17 DIAGNOSIS — E291 Testicular hypofunction: Secondary | ICD-10-CM

## 2013-12-17 MED ORDER — TESTOSTERONE CYPIONATE 200 MG/ML IM SOLN
200.0000 mg | Freq: Once | INTRAMUSCULAR | Status: AC
  Administered 2013-12-17: 200 mg via INTRAMUSCULAR

## 2013-12-17 NOTE — Progress Notes (Signed)
   Subjective:    Patient ID: Richard Beltran, male    DOB: 01-Aug-1965, 48 y.o.   MRN: [id number]  HPI Patient presents today for testosterone injection which he rec'd in h is LUOQ without complication. Patient denies mood swings, CP or headache. Patient is concerned regarding his blood pressure being elevated his last several visits. Margette Fast, CMA   Review of Systems     Objective:   Physical Exam        Assessment & Plan:

## 2013-12-17 NOTE — Assessment & Plan Note (Signed)
Testosterone injection as above. 

## 2013-12-24 ENCOUNTER — Ambulatory Visit (INDEPENDENT_AMBULATORY_CARE_PROVIDER_SITE_OTHER): Payer: No Typology Code available for payment source | Admitting: Sports Medicine

## 2013-12-24 ENCOUNTER — Encounter: Payer: Self-pay | Admitting: Sports Medicine

## 2013-12-24 VITALS — BP 140/80 | HR 77 | Ht 73.0 in | Wt 277.0 lb

## 2013-12-24 DIAGNOSIS — M5136 Other intervertebral disc degeneration, lumbar region: Secondary | ICD-10-CM

## 2013-12-24 DIAGNOSIS — I10 Essential (primary) hypertension: Secondary | ICD-10-CM

## 2013-12-24 DIAGNOSIS — M25819 Other specified joint disorders, unspecified shoulder: Secondary | ICD-10-CM

## 2013-12-24 DIAGNOSIS — M7580 Other shoulder lesions, unspecified shoulder: Secondary | ICD-10-CM

## 2013-12-24 DIAGNOSIS — M51369 Other intervertebral disc degeneration, lumbar region without mention of lumbar back pain or lower extremity pain: Secondary | ICD-10-CM

## 2013-12-24 DIAGNOSIS — M7541 Impingement syndrome of right shoulder: Secondary | ICD-10-CM

## 2013-12-24 DIAGNOSIS — M5137 Other intervertebral disc degeneration, lumbosacral region: Secondary | ICD-10-CM

## 2013-12-24 DIAGNOSIS — M51379 Other intervertebral disc degeneration, lumbosacral region without mention of lumbar back pain or lower extremity pain: Secondary | ICD-10-CM

## 2013-12-24 MED ORDER — TRAMADOL HCL 50 MG PO TABS: ORAL_TABLET | ORAL | Status: DC

## 2013-12-24 MED ORDER — CARVEDILOL 25 MG PO TABS: 25.0000 mg | ORAL_TABLET | Freq: Two times a day (BID) | ORAL | Status: DC

## 2013-12-24 MED ORDER — OLMESARTAN-AMLODIPINE-HCTZ 40-10-25 MG PO TABS: 1.0000 | ORAL_TABLET | Freq: Every day | ORAL | Status: DC

## 2013-12-24 NOTE — Assessment & Plan Note (Signed)
Well-controlled on Tribenzor and Coreg.

## 2013-12-24 NOTE — Progress Notes (Signed)
  Subjective:    CC: Followup  HPI: Lumbar radiculitis: Did not respond to an L5-S1 transforaminal epidural, since he only had good pain relief and continuous pain relief with a selective S1 nerve block, happy with the results so far.  Shoulder pain: Resolved after subacromial injection.  Hypertension: Good response to current medications, needs a refill. No headaches, visual changes, chest pain.  Past medical history, Surgical history, Family history not pertinant except as noted below, Social history, Allergies, and medications have been entered into the medical record, reviewed, and no changes needed.   Review of Systems: No fevers, chills, night sweats, weight loss, chest pain, or shortness of breath.   Objective:    General: Well Developed, well nourished, and in no acute distress.  Neuro: Alert and oriented x3, extra-ocular muscles intact, sensation grossly intact.  HEENT: Normocephalic, atraumatic, pupils equal round reactive to light, neck supple, no masses, no lymphadenopathy, thyroid nonpalpable.  Skin: Warm and dry, no rashes. Cardiac: Regular rate and rhythm, no murmurs rubs or gallops, no lower extremity edema.  Respiratory: Clear to auscultation bilaterally. Not using accessory muscles, speaking in full sentences.  Impression and Recommendations:

## 2013-12-24 NOTE — Assessment & Plan Note (Signed)
Completely resolved after subacromial injection and rehabilitation.

## 2013-12-24 NOTE — Assessment & Plan Note (Signed)
Post L4-L5 fusion, he had no response to a right-sided L5-S1 transforaminal epidural and selective right-sided L5 nerve root block, subsequently he had a fantastic response with complete relief of radicular symptoms with a selective S1 nerve root block. Continue as needed tramadol, return as needed for this.

## 2013-12-31 ENCOUNTER — Ambulatory Visit (INDEPENDENT_AMBULATORY_CARE_PROVIDER_SITE_OTHER): Payer: No Typology Code available for payment source | Admitting: Sports Medicine

## 2013-12-31 VITALS — BP 139/88 | HR 72 | Ht 73.0 in | Wt 278.0 lb

## 2013-12-31 DIAGNOSIS — E291 Testicular hypofunction: Secondary | ICD-10-CM

## 2013-12-31 MED ORDER — TESTOSTERONE CYPIONATE 200 MG/ML IM SOLN
200.0000 mg | Freq: Once | INTRAMUSCULAR | Status: AC
  Administered 2013-12-31: 200 mg via INTRAMUSCULAR

## 2013-12-31 NOTE — Progress Notes (Signed)
   Subjective:    Patient ID: Richard Beltran, male    DOB: 1966/04/22, 48 y.o.   MRN: [id number]  HPI Patient presents for testosterone injection which he rec'd in his RUOQ without complication. Patient denies CP, SOB, headaches or mood swings and has no other concerns at this time. Margette Fast, CMA    Review of Systems     Objective:   Physical Exam        Assessment & Plan:

## 2013-12-31 NOTE — Assessment & Plan Note (Signed)
Testosterone injection as above. 

## 2014-01-08 ENCOUNTER — Ambulatory Visit: Payer: No Typology Code available for payment source

## 2014-01-14 ENCOUNTER — Ambulatory Visit (INDEPENDENT_AMBULATORY_CARE_PROVIDER_SITE_OTHER): Payer: No Typology Code available for payment source | Admitting: Sports Medicine

## 2014-01-14 VITALS — BP 124/77 | HR 80 | Wt 273.0 lb

## 2014-01-14 DIAGNOSIS — E291 Testicular hypofunction: Secondary | ICD-10-CM

## 2014-01-14 MED ORDER — TESTOSTERONE CYPIONATE 200 MG/ML IM SOLN
200.0000 mg | Freq: Once | INTRAMUSCULAR | Status: AC
  Administered 2014-01-14: 200 mg via INTRAMUSCULAR

## 2014-01-14 NOTE — Assessment & Plan Note (Signed)
Testosterone injection as above. 

## 2014-01-14 NOTE — Progress Notes (Signed)
   Subjective:    Patient ID: Richard Beltran, male    DOB: 07-25-1965, 48 y.o.   MRN: [id number]  HPI  Richard Beltran is here for a testosterone injection. Denies chest pain, shortness of breath, headaches or mood changes.   Review of Systems     Objective:   Physical Exam        Assessment & Plan:  Patient tolerated injection well without any complications. Patient advised to schedule his next injection for 2 weeks from today.

## 2014-01-28 ENCOUNTER — Ambulatory Visit (INDEPENDENT_AMBULATORY_CARE_PROVIDER_SITE_OTHER): Payer: No Typology Code available for payment source | Admitting: Sports Medicine

## 2014-01-28 VITALS — BP 139/87 | HR 71 | Wt 272.0 lb

## 2014-01-28 DIAGNOSIS — E291 Testicular hypofunction: Secondary | ICD-10-CM

## 2014-01-28 MED ORDER — TESTOSTERONE CYPIONATE 200 MG/ML IM SOLN
200.0000 mg | INTRAMUSCULAR | Status: DC
  Administered 2014-01-28: 200 mg via INTRAMUSCULAR

## 2014-01-28 NOTE — Progress Notes (Signed)
   Subjective:    Patient ID: Richard Beltran, male    DOB: [birth year], 48 y.o.   MRN: [id number]  HPIPatient is here for a testosterone injection.Denies shortness of breath, mood change, chest pain, or headaches.    Review of Systems     Objective:   Physical Exam        Assessment & Plan:  Patient tolerated injection without complications. Patient advised to schedule next injection for 2 weeks from today. Given information and counseling about Influenza immunization to be considered for next appointment. P.Siren Porrata RN

## 2014-01-28 NOTE — Assessment & Plan Note (Signed)
Testosterone injection as above. 

## 2014-02-11 ENCOUNTER — Ambulatory Visit (INDEPENDENT_AMBULATORY_CARE_PROVIDER_SITE_OTHER): Payer: No Typology Code available for payment source | Admitting: Sports Medicine

## 2014-02-11 VITALS — BP 130/73 | HR 73 | Wt 274.0 lb

## 2014-02-11 DIAGNOSIS — M5136 Other intervertebral disc degeneration, lumbar region: Secondary | ICD-10-CM

## 2014-02-11 DIAGNOSIS — E291 Testicular hypofunction: Secondary | ICD-10-CM

## 2014-02-11 MED ORDER — TESTOSTERONE CYPIONATE 200 MG/ML IM SOLN
200.0000 mg | Freq: Once | INTRAMUSCULAR | Status: AC
  Administered 2014-02-11: 200 mg via INTRAMUSCULAR

## 2014-02-11 MED ORDER — TRAMADOL HCL 50 MG PO TABS: ORAL_TABLET | ORAL | Status: DC

## 2014-02-11 NOTE — Progress Notes (Signed)
   Subjective:    Patient ID: Richard Beltran, male    DOB: 07/19/1965, 48 y.o.   MRN: [id number]  HPI  Ewart B Strojny is here for a testosterone injection. Denies chest pain, shortness of breath, headaches or mood changes.   Review of Systems     Objective:   Physical Exam        Assessment & Plan:  Patient tolerated injection well without complications. Patient advised to schedule next injection 14 days from today.  

## 2014-02-11 NOTE — Assessment & Plan Note (Signed)
Testosterone injection as above. 

## 2014-02-25 ENCOUNTER — Ambulatory Visit (INDEPENDENT_AMBULATORY_CARE_PROVIDER_SITE_OTHER): Payer: No Typology Code available for payment source | Admitting: Sports Medicine

## 2014-02-25 VITALS — BP 129/74 | HR 70 | Wt 277.0 lb

## 2014-02-25 DIAGNOSIS — E291 Testicular hypofunction: Secondary | ICD-10-CM

## 2014-02-25 DIAGNOSIS — M7541 Impingement syndrome of right shoulder: Secondary | ICD-10-CM

## 2014-02-25 MED ORDER — MELOXICAM 15 MG PO TABS: ORAL_TABLET | ORAL | Status: DC

## 2014-02-25 MED ORDER — TESTOSTERONE CYPIONATE 200 MG/ML IM SOLN
200.0000 mg | Freq: Once | INTRAMUSCULAR | Status: AC
  Administered 2014-02-25: 200 mg via INTRAMUSCULAR

## 2014-02-25 NOTE — Assessment & Plan Note (Signed)
Testosterone injection as above. 

## 2014-02-25 NOTE — Progress Notes (Signed)
   Subjective:    Patient ID: Richard Beltran, male    DOB: 10/01/1965, 48 y.o.   MRN: [id number]  HPI  [name] is here for a testosterone injection. Denies chest pain, shortness of breath, headaches or mood changes.   Review of Systems     Objective:   Physical Exam        Assessment & Plan:  Patient tolerated injection well without complications. Patient advised to schedule next injection 14 days from today.  

## 2014-03-11 ENCOUNTER — Ambulatory Visit: Payer: No Typology Code available for payment source

## 2014-03-14 ENCOUNTER — Ambulatory Visit (INDEPENDENT_AMBULATORY_CARE_PROVIDER_SITE_OTHER): Payer: No Typology Code available for payment source | Admitting: Sports Medicine

## 2014-03-14 VITALS — BP 134/76 | HR 66 | Wt 276.0 lb

## 2014-03-14 DIAGNOSIS — E291 Testicular hypofunction: Secondary | ICD-10-CM

## 2014-03-14 MED ORDER — TESTOSTERONE CYPIONATE 200 MG/ML IM SOLN
200.0000 mg | Freq: Once | INTRAMUSCULAR | Status: AC
  Administered 2014-03-14: 200 mg via INTRAMUSCULAR

## 2014-03-14 NOTE — Assessment & Plan Note (Signed)
Testosterone injection as above. 

## 2014-03-14 NOTE — Progress Notes (Signed)
   Subjective:    Patient ID: Richard Beltran, male    DOB: 06/26/1965, 48 y.o.   MRN: [id number]  HPI  Richard Beltran is here for a testosterone injection. Denies chest pain, shortness of breath, headaches or mood changes.   Review of Systems     Objective:   Physical Exam        Assessment & Plan:  Patient tolerated injection well without complications. Patient advised to schedule next injection 14 days from today.  

## 2014-03-21 ENCOUNTER — Other Ambulatory Visit: Payer: Self-pay | Admitting: *Deleted

## 2014-03-21 DIAGNOSIS — M5136 Other intervertebral disc degeneration, lumbar region: Secondary | ICD-10-CM

## 2014-03-21 MED ORDER — TRAMADOL HCL 50 MG PO TABS: ORAL_TABLET | ORAL | Status: DC

## 2014-03-21 NOTE — Progress Notes (Signed)
Voicemail left for refill of tramadol. Margette Fast, CMA

## 2014-03-27 ENCOUNTER — Encounter: Payer: Self-pay | Admitting: Sports Medicine

## 2014-03-27 ENCOUNTER — Ambulatory Visit (INDEPENDENT_AMBULATORY_CARE_PROVIDER_SITE_OTHER): Payer: No Typology Code available for payment source | Admitting: Sports Medicine

## 2014-03-27 VITALS — BP 136/83 | HR 75 | Ht 73.0 in | Wt 283.0 lb

## 2014-03-27 DIAGNOSIS — Z23 Encounter for immunization: Secondary | ICD-10-CM

## 2014-03-27 DIAGNOSIS — M7541 Impingement syndrome of right shoulder: Secondary | ICD-10-CM

## 2014-03-27 DIAGNOSIS — I10 Essential (primary) hypertension: Secondary | ICD-10-CM

## 2014-03-27 DIAGNOSIS — Z0000 Encounter for general adult medical examination without abnormal findings: Secondary | ICD-10-CM

## 2014-03-27 DIAGNOSIS — M51369 Other intervertebral disc degeneration, lumbar region without mention of lumbar back pain or lower extremity pain: Secondary | ICD-10-CM

## 2014-03-27 DIAGNOSIS — M5136 Other intervertebral disc degeneration, lumbar region: Secondary | ICD-10-CM

## 2014-03-27 MED ORDER — OLMESARTAN-AMLODIPINE-HCTZ 40-10-25 MG PO TABS: 1.0000 | ORAL_TABLET | Freq: Every day | ORAL | Status: DC

## 2014-03-27 MED ORDER — CARVEDILOL 25 MG PO TABS: 25.0000 mg | ORAL_TABLET | Freq: Two times a day (BID) | ORAL | Status: DC

## 2014-03-27 NOTE — Assessment & Plan Note (Signed)
Declines influenza vaccination. Tetanus vaccination as above. Return in 6 months.

## 2014-03-27 NOTE — Assessment & Plan Note (Signed)
Controlled, no changes. Refilling medication.

## 2014-03-27 NOTE — Progress Notes (Signed)
  Subjective:    CC: Follow-up  HPI: Right shoulder pain: Improved significantly after subacromial injection, continues to have some pain with external rotation localized in the posterior humeral head.  Hypertension: Well controlled.  Lumbar degenerative disc disease: Continues to be pain free after S1 selective epidural.  Past medical history, Surgical history, Family history not pertinant except as noted below, Social history, Allergies, and medications have been entered into the medical record, reviewed, and no changes needed.   Review of Systems: No fevers, chills, night sweats, weight loss, chest pain, or shortness of breath.   Objective:    General: Well Developed, well nourished, and in no acute distress.  Neuro: Alert and oriented x3, extra-ocular muscles intact, sensation grossly intact.  HEENT: Normocephalic, atraumatic, pupils equal round reactive to light, neck supple, no masses, no lymphadenopathy, thyroid nonpalpable.  Skin: Warm and dry, no rashes. Cardiac: Regular rate and rhythm, no murmurs rubs or gallops, no lower extremity edema.  Respiratory: Clear to auscultation bilaterally. Not using accessory muscles, speaking in full sentences. Right Shoulder: Inspection reveals no abnormalities, atrophy or asymmetry. Palpation is normal with no tenderness over AC joint or bicipital groove. ROM is full in all planes. Rotator cuff strength normal throughout. No signs of impingement with negative Neer and Hawkin's tests, empty can. There is reproduction of pain with resisted external rotation, localized to the insertion of the infraspinatus. Speeds and Yergason's tests normal. No labral pathology noted with negative Obrien's, negative crank, negative clunk, and good stability. Normal scapular function observed. No painful arc and no drop arm sign. No apprehension sign.  Procedure: Real-time Ultrasound Guided Injection of right subacromial bursa over the  infraspinatus Device: GE Logiq E  Verbal informed consent obtained.  Time-out conducted.  Noted no overlying erythema, induration, or other signs of local infection.  Skin prepped in a sterile fashion.  Local anesthesia: Topical Ethyl chloride.  With sterile technique and under real time ultrasound guidance:  1 mL kenalog 40, 4 mL lidocaine injected easily. Completed without difficulty  Pain immediately resolved suggesting accurate placement of the medication.  Advised to call if fevers/chills, erythema, induration, drainage, or persistent bleeding.  Images permanently stored and available for review in the ultrasound unit.  Impression: Technically successful ultrasound guided injection.  Impression and Recommendations:

## 2014-03-27 NOTE — Assessment & Plan Note (Signed)
Continues to be pain free with meloxicam, tramadol, gabapentin, and post a selective S1 nerve root epidural.

## 2014-03-27 NOTE — Assessment & Plan Note (Signed)
Overall doing well, still has some persistent pain that has worsened over the past 4 months since the injection. Pain is predominantly referable to the infraspinatus. Subacromial injection over the infraspinatus tendon. Return in 6 months.

## 2014-04-01 ENCOUNTER — Ambulatory Visit (INDEPENDENT_AMBULATORY_CARE_PROVIDER_SITE_OTHER): Payer: No Typology Code available for payment source | Admitting: Sports Medicine

## 2014-04-01 VITALS — BP 148/90 | HR 88 | Ht 74.0 in | Wt 278.0 lb

## 2014-04-01 DIAGNOSIS — E291 Testicular hypofunction: Secondary | ICD-10-CM

## 2014-04-01 MED ORDER — TESTOSTERONE CYPIONATE 100 MG/ML IM SOLN
200.0000 mg | Freq: Once | INTRAMUSCULAR | Status: AC
  Administered 2014-04-01: 200 mg via INTRAMUSCULAR

## 2014-04-01 NOTE — Assessment & Plan Note (Signed)
Testosterone injection as above. 

## 2014-04-01 NOTE — Progress Notes (Signed)
   Subjective:    Patient ID: Richard Beltran, male    DOB: 12/25/1965, 48 y.o.   MRN: [id number]  HPI  Darral presents to office today for testosterone injection which he rec's every 2 weeks. He denies CP, SOB or mood swings. No other GU concerns at this time. Margette Fast, CMA    Review of Systems     Objective:   Physical Exam        Assessment & Plan:

## 2014-04-15 ENCOUNTER — Ambulatory Visit: Payer: No Typology Code available for payment source

## 2014-04-17 ENCOUNTER — Ambulatory Visit (INDEPENDENT_AMBULATORY_CARE_PROVIDER_SITE_OTHER): Payer: No Typology Code available for payment source | Admitting: Sports Medicine

## 2014-04-17 VITALS — BP 137/85 | HR 72 | Wt 275.0 lb

## 2014-04-17 DIAGNOSIS — E291 Testicular hypofunction: Secondary | ICD-10-CM

## 2014-04-17 MED ORDER — TESTOSTERONE CYPIONATE 100 MG/ML IM SOLN
200.0000 mg | Freq: Once | INTRAMUSCULAR | Status: AC
  Administered 2014-04-17: 200 mg via INTRAMUSCULAR

## 2014-04-17 NOTE — Assessment & Plan Note (Signed)
Testosterone injection as above. 

## 2014-04-17 NOTE — Progress Notes (Signed)
   Subjective:    Patient ID: Richard Beltran, male    DOB: 05-Sep-1965, 48 y.o.   MRN: [id number]  HPI  [name] comes to office today for scheduled testosterone injection which he received without complication. He denies CP, SOB, headache or mood swings. No other GU symptoms at this time. Margette Fast, CMA    Review of Systems     Objective:   Physical Exam        Assessment & Plan:

## 2014-04-29 ENCOUNTER — Ambulatory Visit (INDEPENDENT_AMBULATORY_CARE_PROVIDER_SITE_OTHER): Payer: No Typology Code available for payment source | Admitting: Sports Medicine

## 2014-04-29 VITALS — BP 133/71 | HR 72 | Wt 282.0 lb

## 2014-04-29 DIAGNOSIS — E291 Testicular hypofunction: Secondary | ICD-10-CM

## 2014-04-29 MED ORDER — TESTOSTERONE CYPIONATE 200 MG/ML IM SOLN
200.0000 mg | Freq: Once | INTRAMUSCULAR | Status: AC
  Administered 2014-04-29: 200 mg via INTRAMUSCULAR

## 2014-04-29 NOTE — Assessment & Plan Note (Signed)
Testosterone injection as above. 

## 2014-04-29 NOTE — Progress Notes (Signed)
   Subjective:    Patient ID: Richard Beltran, male    DOB: 08/14/1965, 48 y.o.   MRN: [id number]  HPI  SIE FORMISANO is here for a testosterone injection. Denies chest pain, shortness of breath, headaches or mood changes.   Review of Systems     Objective:   Physical Exam        Assessment & Plan:  Patient tolerated injection well without complications. Patient advised to schedule next injection 14 days from today.

## 2014-05-01 ENCOUNTER — Other Ambulatory Visit: Payer: Self-pay | Admitting: Sports Medicine

## 2014-05-13 ENCOUNTER — Ambulatory Visit (INDEPENDENT_AMBULATORY_CARE_PROVIDER_SITE_OTHER): Payer: No Typology Code available for payment source | Admitting: Sports Medicine

## 2014-05-13 VITALS — BP 119/75 | HR 68 | Wt 273.0 lb

## 2014-05-13 DIAGNOSIS — E291 Testicular hypofunction: Secondary | ICD-10-CM

## 2014-05-13 MED ORDER — TESTOSTERONE CYPIONATE 200 MG/ML IM SOLN
200.0000 mg | Freq: Once | INTRAMUSCULAR | Status: AC
  Administered 2014-05-13: 200 mg via INTRAMUSCULAR

## 2014-05-13 NOTE — Assessment & Plan Note (Signed)
Testosterone injection as above. 

## 2014-05-13 NOTE — Progress Notes (Signed)
   Subjective:    Patient ID: Richard Beltran, male    DOB: 07-16-1965, 49 y.o.   MRN: [id number]  HPI  Richard Beltran is here for a testosterone injection. Denies chest pain, shortness of breath, headaches or mood changes.   Review of Systems     Objective:   Physical Exam        Assessment & Plan:  Patient tolerated injection well without complications. Patient advised to schedule next injection 14 days from today.

## 2014-05-17 ENCOUNTER — Ambulatory Visit: Payer: Self-pay | Admitting: Podiatry

## 2014-05-21 ENCOUNTER — Encounter: Payer: Self-pay | Admitting: Podiatry

## 2014-05-21 ENCOUNTER — Ambulatory Visit (INDEPENDENT_AMBULATORY_CARE_PROVIDER_SITE_OTHER): Payer: No Typology Code available for payment source | Admitting: Podiatry

## 2014-05-21 VITALS — BP 148/86 | HR 74 | Resp 12

## 2014-05-21 DIAGNOSIS — L600 Ingrowing nail: Secondary | ICD-10-CM

## 2014-05-21 DIAGNOSIS — L603 Nail dystrophy: Secondary | ICD-10-CM

## 2014-05-21 MED ORDER — NEOMYCIN-POLYMYXIN-HC 3.5-10000-1 OT SOLN: OTIC | Status: DC

## 2014-05-21 NOTE — Progress Notes (Signed)
   Subjective:    Patient ID: Richard Beltran, male    DOB: 01-22-1966, 49 y.o.   MRN: [id number]  HPI  PT STATED RT 1ST, 2ND, LT 1ST TOENAIL HAVE DISCOLORATION, THICK AND SORE FOR 6 MONTHS. THE TOENAILS ARE GETTING WORSE AND GET AGGRAVATED WEARING SHOES ESPECIALLY THE RT GREAT TOENAIL. TRIED NO TREATMENT.  Review of Systems  Skin: Positive for color change.  All other systems reviewed and are negative.      Objective:   Physical Exam: I have reviewed his past medical history medications allergies surgery social history and review of systems. Pulses are strongly palpable bilateral. Neurologic sensorium is intact per Weinstein monofilament. Deep tendon reflexes are intact bilateral and muscle strength +5 over 5 dorsiflexion plantar flexors and inverters and everters. Orthopedic evaluation of a straight solid joints distal to the ankle for range of motion without crepitation. Cutaneous evaluation of a straight supple well-hydrated cutis sharply incurvated nail margins to the tibial and fibular borders of the hallux right as well as the tibial border of the second toe right. There is area of discoloration in the hallux nail plate left that very well could be onychomycosis.        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 Nail dystrophy can rule out onychomycosis hallux left.  Plan: Total nail avulsion was performed today for the ingrown nails in the painful hallux nail plate right. This was performed as a temporary procedure without phenol in hopes that the nail plate will grow back normally. The medial aspect of the second nail right foot we did remove and we did utilize phenol. He tolerated this procedure well and he was given both oral and written home-going instructions for the care of this foot postoperatively. I will follow-up with him in 1 week at which time we will take a biopsy of the hallux nail plate left. He is to let this grow until that  point.

## 2014-05-21 NOTE — Patient Instructions (Signed)

## 2014-05-27 ENCOUNTER — Ambulatory Visit (INDEPENDENT_AMBULATORY_CARE_PROVIDER_SITE_OTHER): Payer: No Typology Code available for payment source | Admitting: Sports Medicine

## 2014-05-27 ENCOUNTER — Other Ambulatory Visit: Payer: Self-pay

## 2014-05-27 VITALS — BP 134/83 | HR 81 | Resp 16 | Wt 273.0 lb

## 2014-05-27 DIAGNOSIS — M7541 Impingement syndrome of right shoulder: Secondary | ICD-10-CM

## 2014-05-27 DIAGNOSIS — E291 Testicular hypofunction: Secondary | ICD-10-CM

## 2014-05-27 MED ORDER — TESTOSTERONE CYPIONATE 200 MG/ML IM SOLN: 200.0000 mg | Freq: Once | INTRAMUSCULAR | Status: DC

## 2014-05-27 MED ORDER — TESTOSTERONE CYPIONATE 200 MG/ML IM SOLN: 200.0000 mg | INTRAMUSCULAR | Status: DC

## 2014-05-27 MED ORDER — MELOXICAM 15 MG PO TABS: ORAL_TABLET | ORAL | Status: DC

## 2014-05-27 NOTE — Progress Notes (Signed)
   Subjective:    Patient ID: Richard Beltran, male    DOB: Mar 27, 1966, 49 y.o.   MRN: [id number]  HPI Patient is here for a testosterone injection; denies chest pain, shortness of breath, mood changes. Does report headache and having back pain: requests refill for Meloxicam. Also having insurance problems and cost of Tribenzor until March/2016 is prohibative; no samples available; will consult with Dr.T about alternatives. P.Caeson Filippi, RN    Review of Systems     Objective:   Physical Exam        Assessment & Plan:  VS stable; patient tolerated injection well without complications. Patient advised to schedule his next  Injection for 2 weeks from today.

## 2014-05-27 NOTE — Telephone Encounter (Signed)
Patient request refill on Meloxicam #90 1 R were sent to patient pharmacy. Rivaldo Hineman,CMA

## 2014-05-28 ENCOUNTER — Ambulatory Visit (INDEPENDENT_AMBULATORY_CARE_PROVIDER_SITE_OTHER): Payer: No Typology Code available for payment source | Admitting: Podiatry

## 2014-05-28 DIAGNOSIS — L603 Nail dystrophy: Secondary | ICD-10-CM

## 2014-05-28 DIAGNOSIS — L600 Ingrowing nail: Secondary | ICD-10-CM

## 2014-05-28 NOTE — Progress Notes (Signed)
He presents today for follow-up of matrixectomy right foot. He continues to soak in Betadine and water. He is also complaining of onychomycosis hallux left.  Objective: Vital signs are stable he is alert and oriented 3. Pulses are strongly palpable. Deep tendon reflexes are intact bilateral and muscle strength is 5 over 5 also flexes plantar flexors and inverters everters onto the musculature is intact. Cutaneous evaluation and stress well healing surgical toes hallux right. Nail dystrophy hallux left Rule out onychomycosis.  Assessment: Well-healing surgical toes right foot. Nail dystrophy hallux left.  Plan: Discontinue Betadine start soaking with Epsom salts and warm water. He will continue Cortisporin otic and apply twice daily after soaking. Samples of his nail and skin were taken from the left hallux today and I will follow-up with him once his pathology report returns.

## 2014-06-10 ENCOUNTER — Ambulatory Visit (INDEPENDENT_AMBULATORY_CARE_PROVIDER_SITE_OTHER): Payer: No Typology Code available for payment source | Admitting: Sports Medicine

## 2014-06-10 VITALS — BP 201/104 | HR 75 | Wt 274.0 lb

## 2014-06-10 DIAGNOSIS — I10 Essential (primary) hypertension: Secondary | ICD-10-CM

## 2014-06-10 MED ORDER — LISINOPRIL-HYDROCHLOROTHIAZIDE 20-25 MG PO TABS: 1.0000 | ORAL_TABLET | Freq: Every day | ORAL | Status: DC

## 2014-06-10 MED ORDER — TRAMADOL HCL 50 MG PO TABS: ORAL_TABLET | ORAL | Status: DC

## 2014-06-10 MED ORDER — CARVEDILOL 25 MG PO TABS: 25.0000 mg | ORAL_TABLET | Freq: Two times a day (BID) | ORAL | Status: DC

## 2014-06-10 MED ORDER — OLMESARTAN-AMLODIPINE-HCTZ 40-10-25 MG PO TABS: 1.0000 | ORAL_TABLET | Freq: Every day | ORAL | Status: DC

## 2014-06-10 MED ORDER — AMLODIPINE BESYLATE 10 MG PO TABS
10.0000 mg | ORAL_TABLET | Freq: Every day | ORAL | Status: DC
Start: 2014-06-10 — End: 2014-07-10

## 2014-06-10 NOTE — Progress Notes (Signed)
  Subjective:    CC:  Elevated blood pressure  HPI:  Richard Beltran returns, he was here for a testosterone injection, this was aborted due to an extremely high blood pressure. He does endorse feeling achy all over. Unfortunately he did run out of his medicine, his insurance changed and he was unable to afford another prescription. He has no chest pain, headaches, or visual changes. Symptoms are mild, persistent.  Past medical history, Surgical history, Family history not pertinant except as noted below, Social history, Allergies, and medications have been entered into the medical record, reviewed, and no changes needed.   Review of Systems: No fevers, chills, night sweats, weight loss, chest pain, or shortness of breath.   Objective:    General: Well Developed, well nourished, and in no acute distress.  Neuro: Alert and oriented x3, extra-ocular muscles intact, sensation grossly intact.  HEENT: Normocephalic, atraumatic, pupils equal round reactive to light, neck supple, no masses, no lymphadenopathy, thyroid nonpalpable.  Skin: Warm and dry, no rashes. Cardiac: Regular rate and rhythm, no murmurs rubs or gallops,  Trace lower extremity edema.  Respiratory: Clear to auscultation bilaterally. Not using accessory muscles, speaking in full sentences.  Impression and Recommendations:

## 2014-06-10 NOTE — Assessment & Plan Note (Addendum)
Was well controlled on Tribenzor, and carvedilol, unfortunately insurance change and was unable to obtain these medications. I'm going to give him another prescription for Tribenzor which she will fill in March. Until then we are going to give him amlodipine, hydrochlorothiazide, carvedilol, and lisinopril separately.  Once he restarts Tribenzor we will discontinue the above medications.

## 2014-06-13 ENCOUNTER — Ambulatory Visit: Payer: No Typology Code available for payment source

## 2014-06-14 ENCOUNTER — Ambulatory Visit (INDEPENDENT_AMBULATORY_CARE_PROVIDER_SITE_OTHER): Payer: No Typology Code available for payment source | Admitting: Sports Medicine

## 2014-06-14 VITALS — BP 144/88 | HR 76

## 2014-06-14 DIAGNOSIS — E291 Testicular hypofunction: Secondary | ICD-10-CM

## 2014-06-14 MED ORDER — TESTOSTERONE CYPIONATE 200 MG/ML IM SOLN
200.0000 mg | Freq: Once | INTRAMUSCULAR | Status: AC
  Administered 2014-06-14: 200 mg via INTRAMUSCULAR

## 2014-06-14 NOTE — Progress Notes (Signed)
   Subjective:    Patient ID: Richard Beltran, male    DOB: 04-17-1966, 49 y.o.   MRN: [id number]  HPI  CORTLANDT CAPUANO is here for a testosterone injection. Denies chest pain, shortness of breath, headaches or mood changes. He states he is feeling a lot better after starting the blood pressure medication.   Review of Systems     Objective:   Physical Exam        Assessment & Plan:  Patient tolerated injection well without complications. Patient advised to schedule next injection 14 days from today.

## 2014-06-14 NOTE — Assessment & Plan Note (Signed)
Testosterone injection as above. 

## 2014-06-20 ENCOUNTER — Encounter: Payer: Self-pay | Admitting: Podiatry

## 2014-06-27 ENCOUNTER — Ambulatory Visit (INDEPENDENT_AMBULATORY_CARE_PROVIDER_SITE_OTHER): Payer: No Typology Code available for payment source | Admitting: Podiatry

## 2014-06-27 ENCOUNTER — Encounter: Payer: Self-pay | Admitting: Podiatry

## 2014-06-27 VITALS — BP 157/71 | HR 67 | Resp 16

## 2014-06-27 DIAGNOSIS — L603 Nail dystrophy: Secondary | ICD-10-CM

## 2014-06-27 DIAGNOSIS — B351 Tinea unguium: Secondary | ICD-10-CM

## 2014-06-27 LAB — HEPATIC FUNCTION PANEL
ALT: 13 U/L (ref 0–53)
AST: 13 U/L (ref 0–37)
Albumin: 4.2 g/dL (ref 3.5–5.2)
Alkaline Phosphatase: 50 U/L (ref 39–117)
BILIRUBIN INDIRECT: 0.6 mg/dL (ref 0.2–1.2)
BILIRUBIN TOTAL: 0.7 mg/dL (ref 0.2–1.2)
Bilirubin, Direct: 0.1 mg/dL (ref 0.0–0.3)
Total Protein: 6.8 g/dL (ref 6.0–8.3)

## 2014-06-27 MED ORDER — TERBINAFINE HCL 250 MG PO TABS: 250.0000 mg | ORAL_TABLET | Freq: Every day | ORAL | Status: DC

## 2014-06-27 NOTE — Progress Notes (Signed)
He presents today for matrixectomy hallux and second digit of the right foot. He denies fever chills nausea vomiting muscle aches and pains. He also presents for the follow-up of his pathology report.  Objective: Vital signs are stable he is alert and oriented 3. There is no erythema edema cellulitis drainage or odor to the hallux or second digit of the right foot. These nails. Beagle rowing normally. His pathology report is positive for fungus and we will treat with oral medication at this time.  Assessment: Onychomycosis with well-healed matrixectomy's.  Plan: Start him on a Lamisil therapy 250 mg tablets 1 by mouth daily and we also requested a liver profile. Discontinue Betadine as to the Epsom salts and water soaks covered in the daily and leave open at night. He will continue Cortisporin Otic. He will continue to soak until this foot is completely healed. He will watch for signs and symptoms of infection and notify us if there are any.

## 2014-06-28 ENCOUNTER — Ambulatory Visit (INDEPENDENT_AMBULATORY_CARE_PROVIDER_SITE_OTHER): Payer: No Typology Code available for payment source | Admitting: Sports Medicine

## 2014-06-28 VITALS — BP 135/82 | HR 69 | Wt 275.0 lb

## 2014-06-28 DIAGNOSIS — E291 Testicular hypofunction: Secondary | ICD-10-CM

## 2014-06-28 MED ORDER — TESTOSTERONE CYPIONATE 200 MG/ML IM SOLN
200.0000 mg | Freq: Once | INTRAMUSCULAR | Status: AC
  Administered 2014-06-28: 200 mg via INTRAMUSCULAR

## 2014-06-28 NOTE — Progress Notes (Signed)
   Subjective:    Patient ID: Richard Beltran, male    DOB: 12-Jul-1965, 49 y.o.   MRN: [id number]  HPI  Richard Beltran is here for a testosterone injection. Denies chest pain, shortness of breath, headaches or mood changes.   Hypertension - He reports feeling better now that his blood pressure is back under control.   Review of Systems     Objective:   Physical Exam        Assessment & Plan:  Hypogonadism - Patient tolerated injection well without complications. Patient advised to schedule next injection 14 days from today.   Hypertension - Patient is aware he needs to stay on current blood pressure medication until his new insurance starts in March. Hopefully his new insurance will cover Tribenzor. Patient advised to bring in his new insurance card in March.

## 2014-06-28 NOTE — Assessment & Plan Note (Signed)
Testosterone injection as above. 

## 2014-07-01 NOTE — Progress Notes (Signed)
Contacted patient - informed him of normal labs and is okay to continue with medication.

## 2014-07-09 ENCOUNTER — Telehealth: Payer: Self-pay | Admitting: Sports Medicine

## 2014-07-09 ENCOUNTER — Other Ambulatory Visit: Payer: Self-pay | Admitting: *Deleted

## 2014-07-09 MED ORDER — TRAMADOL HCL 50 MG PO TABS: ORAL_TABLET | ORAL | Status: DC

## 2014-07-09 NOTE — Telephone Encounter (Signed)
Pt called. He wants refill onTribenzor. He has changed insurances so the is going to need a PA for [employer].Marland Kitchen He also needs refill on his Tramadol. New Insurance: [employer]p # [id number], ID # [id number], Bin [phone] and PCN [id number].  His ph # is, [phone].

## 2014-07-09 NOTE — Telephone Encounter (Signed)
Tramadol printed

## 2014-07-10 ENCOUNTER — Telehealth: Payer: Self-pay

## 2014-07-10 ENCOUNTER — Other Ambulatory Visit: Payer: Self-pay

## 2014-07-10 ENCOUNTER — Other Ambulatory Visit: Payer: Self-pay | Admitting: Sports Medicine

## 2014-07-10 DIAGNOSIS — I10 Essential (primary) hypertension: Secondary | ICD-10-CM

## 2014-07-10 MED ORDER — LISINOPRIL-HYDROCHLOROTHIAZIDE 20-25 MG PO TABS: 1.0000 | ORAL_TABLET | Freq: Every day | ORAL | Status: DC

## 2014-07-10 MED ORDER — CARVEDILOL 25 MG PO TABS: 25.0000 mg | ORAL_TABLET | Freq: Two times a day (BID) | ORAL | Status: DC

## 2014-07-10 MED ORDER — AMLODIPINE BESYLATE 10 MG PO TABS: 10.0000 mg | ORAL_TABLET | Freq: Every day | ORAL | Status: DC

## 2014-07-10 NOTE — Telephone Encounter (Signed)
Received fax from [facility] authorizing the tribenzor file ID PA- [id number] Josem Kaufmann is good until 07/10/2015 - CF

## 2014-07-10 NOTE — Telephone Encounter (Signed)
Rx has been faxed to patient pharmacy. Rhonda Cunningham,CMA

## 2014-07-10 NOTE — Telephone Encounter (Signed)
Medication approved

## 2014-07-10 NOTE — Telephone Encounter (Signed)
Received fax for pa on tribenzor [id number] sent through cover my meds waiting on auth. - CF

## 2014-07-12 ENCOUNTER — Ambulatory Visit (INDEPENDENT_AMBULATORY_CARE_PROVIDER_SITE_OTHER): Payer: No Typology Code available for payment source | Admitting: Sports Medicine

## 2014-07-12 ENCOUNTER — Other Ambulatory Visit: Payer: Self-pay | Admitting: Emergency Medicine

## 2014-07-12 VITALS — BP 128/85 | HR 71 | Resp 16 | Wt 274.0 lb

## 2014-07-12 DIAGNOSIS — E291 Testicular hypofunction: Secondary | ICD-10-CM | POA: Diagnosis not present

## 2014-07-12 MED ORDER — TESTOSTERONE CYPIONATE 200 MG/ML IM SOLN
200.0000 mg | INTRAMUSCULAR | Status: DC
  Administered 2014-07-12: 200 mg via INTRAMUSCULAR

## 2014-07-12 NOTE — Progress Notes (Signed)
Testosterone injection given. 

## 2014-07-12 NOTE — Assessment & Plan Note (Signed)
Testosterone injection as above. 

## 2014-07-12 NOTE — Progress Notes (Deleted)
Testosterone injection given. 

## 2014-07-25 ENCOUNTER — Ambulatory Visit: Payer: No Typology Code available for payment source | Admitting: Podiatry

## 2014-07-29 ENCOUNTER — Ambulatory Visit: Payer: No Typology Code available for payment source

## 2014-07-30 ENCOUNTER — Ambulatory Visit (INDEPENDENT_AMBULATORY_CARE_PROVIDER_SITE_OTHER): Payer: No Typology Code available for payment source | Admitting: Sports Medicine

## 2014-07-30 VITALS — BP 122/74 | HR 67 | Ht 73.0 in | Wt 273.0 lb

## 2014-07-30 DIAGNOSIS — E291 Testicular hypofunction: Secondary | ICD-10-CM

## 2014-07-30 MED ORDER — TESTOSTERONE CYPIONATE 200 MG/ML IM SOLN
200.0000 mg | Freq: Once | INTRAMUSCULAR | Status: AC
  Administered 2014-07-30: 200 mg via INTRAMUSCULAR

## 2014-07-30 NOTE — Assessment & Plan Note (Signed)
Testosterone injection as above. 

## 2014-07-30 NOTE — Progress Notes (Signed)
   Subjective:    Patient ID: Richard Beltran, male    DOB: 07-26-1965, 49 y.o.   MRN: [id number]  HPI Patient came into office today for testosterone injection. Denies chest pain, shortness of breath, headaches and problems associated with taking this medication. Patient states he has had no abnornal mood swings.    Review of Systems     Objective:   Physical Exam        Assessment & Plan:  Patient tolerated injection well in [place] without complications. Patient advised to schedule his next injection for 2 weeks from today.

## 2014-08-13 ENCOUNTER — Ambulatory Visit (INDEPENDENT_AMBULATORY_CARE_PROVIDER_SITE_OTHER): Payer: No Typology Code available for payment source | Admitting: Sports Medicine

## 2014-08-13 VITALS — BP 124/75 | HR 76 | Ht 73.0 in | Wt 275.0 lb

## 2014-08-13 DIAGNOSIS — E291 Testicular hypofunction: Secondary | ICD-10-CM | POA: Diagnosis not present

## 2014-08-13 DIAGNOSIS — M5136 Other intervertebral disc degeneration, lumbar region: Secondary | ICD-10-CM | POA: Diagnosis not present

## 2014-08-13 DIAGNOSIS — M51369 Other intervertebral disc degeneration, lumbar region without mention of lumbar back pain or lower extremity pain: Secondary | ICD-10-CM

## 2014-08-13 MED ORDER — TESTOSTERONE CYPIONATE 200 MG/ML IM SOLN
200.0000 mg | Freq: Once | INTRAMUSCULAR | Status: AC
  Administered 2014-08-13: 200 mg via INTRAMUSCULAR

## 2014-08-13 MED ORDER — TRAMADOL HCL 50 MG PO TABS: ORAL_TABLET | ORAL | Status: DC

## 2014-08-13 NOTE — Assessment & Plan Note (Signed)
Tramadol refill as above. Continues to do well with tramadol, gabapentin, and post S1 nerve root block.

## 2014-08-13 NOTE — Progress Notes (Signed)
   Subjective:    Patient ID: Richard Beltran, male    DOB: [birth year], 49 y.o.   MRN: [id number]  HPI Patient came into office today for testosterone injection. Denies chest pain, shortness of breath, headaches and problems associated with taking this medication. Patient states he has had no abnornal mood swings. Patient does state it is time for a refill on his Tramadol, new Rx was provided at visit. Patient will take to his pharmacy to be filled.   Review of Systems     Objective:   Physical Exam        Assessment & Plan:  Patient tolerated injection well in [place] without complications. Patient advised to schedule his next injection for 2 weeks from today.

## 2014-08-27 ENCOUNTER — Ambulatory Visit (INDEPENDENT_AMBULATORY_CARE_PROVIDER_SITE_OTHER): Payer: No Typology Code available for payment source | Admitting: Sports Medicine

## 2014-08-27 VITALS — BP 122/76 | HR 65 | Wt 274.0 lb

## 2014-08-27 DIAGNOSIS — E291 Testicular hypofunction: Secondary | ICD-10-CM

## 2014-08-27 MED ORDER — TESTOSTERONE CYPIONATE 200 MG/ML IM SOLN
100.0000 mg | Freq: Once | INTRAMUSCULAR | Status: AC
  Administered 2014-08-27: 100 mg via INTRAMUSCULAR

## 2014-08-27 NOTE — Progress Notes (Signed)
Patient here today for Testosterone injection. Patient stated everything is well.  Patient tolerated injection well in the [place]. Patient returning in 2 weeks

## 2014-08-27 NOTE — Assessment & Plan Note (Signed)
Testosterone injection as above. 

## 2014-09-04 ENCOUNTER — Other Ambulatory Visit: Payer: Self-pay | Admitting: Sports Medicine

## 2014-09-09 ENCOUNTER — Other Ambulatory Visit: Payer: Self-pay | Admitting: Sports Medicine

## 2014-09-10 ENCOUNTER — Ambulatory Visit (INDEPENDENT_AMBULATORY_CARE_PROVIDER_SITE_OTHER): Payer: No Typology Code available for payment source | Admitting: Sports Medicine

## 2014-09-10 VITALS — BP 129/81 | HR 72 | Wt 273.0 lb

## 2014-09-10 DIAGNOSIS — E291 Testicular hypofunction: Secondary | ICD-10-CM | POA: Diagnosis not present

## 2014-09-10 MED ORDER — TESTOSTERONE CYPIONATE 200 MG/ML IM SOLN
200.0000 mg | Freq: Once | INTRAMUSCULAR | Status: AC
  Administered 2014-09-10: 200 mg via INTRAMUSCULAR

## 2014-09-10 NOTE — Assessment & Plan Note (Signed)
Testosterone injection as above. 

## 2014-09-10 NOTE — Progress Notes (Signed)
   Subjective:    Patient ID: Richard Beltran, male    DOB: December 19, 1965, 49 y.o.   MRN: [id number]  HPI  Richard Beltran is here for a testosterone injection. Denies chest pain, shortness of breath, headaches or mood changes.   Review of Systems     Objective:   Physical Exam        Assessment & Plan:  Patient tolerated injection well without complications. Patient advised to schedule next injection 14 days from today.

## 2014-09-24 ENCOUNTER — Ambulatory Visit (INDEPENDENT_AMBULATORY_CARE_PROVIDER_SITE_OTHER): Payer: No Typology Code available for payment source | Admitting: Sports Medicine

## 2014-09-24 VITALS — BP 136/84 | HR 72 | Wt 275.0 lb

## 2014-09-24 DIAGNOSIS — E291 Testicular hypofunction: Secondary | ICD-10-CM | POA: Diagnosis not present

## 2014-09-24 MED ORDER — TRAMADOL HCL 50 MG PO TABS: ORAL_TABLET | ORAL | Status: DC

## 2014-09-24 MED ORDER — TESTOSTERONE CYPIONATE 200 MG/ML IM SOLN
200.0000 mg | Freq: Once | INTRAMUSCULAR | Status: AC
  Administered 2014-09-24: 200 mg via INTRAMUSCULAR

## 2014-09-24 NOTE — Assessment & Plan Note (Signed)
Testosterone injection as above. 

## 2014-09-24 NOTE — Progress Notes (Signed)
   Subjective:    Patient ID: Richard Beltran, male    DOB: 10/28/1965, 49 y.o.   MRN: [id number]  HPI  Patient came into office today for testosterone injection. Denies chest pain, shortness of breath, headaches and problems associated with taking this medication. Patient states he has had no abnornal mood swings. Patient does request a refill on Tramadol Rx today, upon review of his chart it is time for his refill.   Review of Systems     Objective:   Physical Exam        Assessment & Plan:  Patient tolerated injection in [place] well without complications. Patient advised to schedule his next injection for 2 weeks from today. Tramadol Rx was taken with him today.

## 2014-10-03 ENCOUNTER — Other Ambulatory Visit: Payer: Self-pay | Admitting: Sports Medicine

## 2014-10-08 ENCOUNTER — Other Ambulatory Visit: Payer: Self-pay | Admitting: Sports Medicine

## 2014-10-08 ENCOUNTER — Ambulatory Visit (INDEPENDENT_AMBULATORY_CARE_PROVIDER_SITE_OTHER): Payer: No Typology Code available for payment source | Admitting: Sports Medicine

## 2014-10-08 VITALS — BP 127/80 | HR 70 | Wt 276.0 lb

## 2014-10-08 DIAGNOSIS — E291 Testicular hypofunction: Secondary | ICD-10-CM

## 2014-10-08 DIAGNOSIS — E230 Hypopituitarism: Secondary | ICD-10-CM

## 2014-10-08 MED ORDER — TESTOSTERONE CYPIONATE 200 MG/ML IM SOLN
200.0000 mg | Freq: Once | INTRAMUSCULAR | Status: AC
  Administered 2014-10-08: 200 mg via INTRAMUSCULAR

## 2014-10-08 NOTE — Assessment & Plan Note (Signed)
Testosterone injection as above. 

## 2014-10-08 NOTE — Progress Notes (Signed)
   Subjective:    Patient ID: Richard Beltran, male    DOB: 31-Oct-1965, 49 y.o.   MRN: [id number]  HPI  Patient came into office today for testosterone injection. Denies chest pain, shortness of breath, headaches and problems associated with taking this medication. Patient states he has had no abnornal mood swings.   Review of Systems     Objective:   Physical Exam        Assessment & Plan:  Patient tolerated injection in [place]  well without complications. Patient advised to schedule his next injection for 2 weeks from today.

## 2014-10-22 ENCOUNTER — Encounter: Payer: Self-pay | Admitting: Sports Medicine

## 2014-10-22 ENCOUNTER — Ambulatory Visit (INDEPENDENT_AMBULATORY_CARE_PROVIDER_SITE_OTHER): Payer: No Typology Code available for payment source | Admitting: Sports Medicine

## 2014-10-22 VITALS — BP 132/89 | HR 71 | Ht 73.0 in | Wt 276.0 lb

## 2014-10-22 DIAGNOSIS — G4719 Other hypersomnia: Secondary | ICD-10-CM

## 2014-10-22 DIAGNOSIS — I10 Essential (primary) hypertension: Secondary | ICD-10-CM | POA: Diagnosis not present

## 2014-10-22 DIAGNOSIS — G4733 Obstructive sleep apnea (adult) (pediatric): Secondary | ICD-10-CM | POA: Insufficient documentation

## 2014-10-22 DIAGNOSIS — M5136 Other intervertebral disc degeneration, lumbar region: Secondary | ICD-10-CM | POA: Diagnosis not present

## 2014-10-22 DIAGNOSIS — E291 Testicular hypofunction: Secondary | ICD-10-CM

## 2014-10-22 DIAGNOSIS — M51369 Other intervertebral disc degeneration, lumbar region without mention of lumbar back pain or lower extremity pain: Secondary | ICD-10-CM

## 2014-10-22 MED ORDER — TESTOSTERONE CYPIONATE 200 MG/ML IM SOLN
200.0000 mg | INTRAMUSCULAR | Status: DC
  Administered 2014-10-22: 200 mg via INTRAMUSCULAR

## 2014-10-22 MED ORDER — TRAMADOL HCL 50 MG PO TABS: ORAL_TABLET | ORAL | Status: DC

## 2014-10-22 NOTE — Progress Notes (Signed)
  Subjective:    CC: follow-up  HPI: Hypertension: Controlled  Hypergonadism: Due for testosterone levels  Fatigue: We are going to recheck testosterone levels, tends to snore at night, does have some daytime sleepiness.  Past medical history, Surgical history, Family history not pertinant except as noted below, Social history, Allergies, and medications have been entered into the medical record, reviewed, and no changes needed.   Review of Systems: No fevers, chills, night sweats, weight loss, chest pain, or shortness of breath.   Objective:    General: Well Developed, well nourished, and in no acute distress.  Neuro: Alert and oriented x3, extra-ocular muscles intact, sensation grossly intact.  HEENT: Normocephalic, atraumatic, pupils equal round reactive to light, neck supple, no masses, no lymphadenopathy, thyroid nonpalpable.  Skin: Warm and dry, no rashes. Cardiac: Regular rate and rhythm, no murmurs rubs or gallops, no lower extremity edema.  Respiratory: Clear to auscultation bilaterally. Not using accessory muscles, speaking in full sentences.  Impression and Recommendations:

## 2014-10-22 NOTE — Assessment & Plan Note (Signed)
Well-controlled, he does have some excessive daytime sleepiness. He also had very difficult to control hypertension. We are going to order a sleep study.

## 2014-10-22 NOTE — Assessment & Plan Note (Signed)
Rechecking testosterone levels in one week. Still with some excessive daytime sleepiness.

## 2014-10-22 NOTE — Assessment & Plan Note (Signed)
Well controlled, refilling tramadol

## 2014-10-23 ENCOUNTER — Ambulatory Visit: Payer: No Typology Code available for payment source

## 2014-10-25 LAB — TESTOSTERONE, FREE, TOTAL, SHBG
Sex Hormone Binding: 25 nmol/L (ref 10–50)
Testosterone, Free: 167.8 pg/mL (ref 47.0–244.0)
Testosterone-% Free: 2.5 % (ref 1.6–2.9)
Testosterone: 659 ng/dL (ref 300–890)

## 2014-10-25 LAB — CBC
HCT: 44.3 % (ref 39.0–52.0)
Hemoglobin: 14.4 g/dL (ref 13.0–17.0)
MCH: 28.5 pg (ref 26.0–34.0)
MCHC: 32.5 g/dL (ref 30.0–36.0)
MCV: 87.5 fL (ref 78.0–100.0)
MPV: 10 fL (ref 8.6–12.4)
Platelets: 235 K/uL (ref 150–400)
RBC: 5.06 MIL/uL (ref 4.22–5.81)
RDW: 13.6 % (ref 11.5–15.5)
WBC: 7.5 K/uL (ref 4.0–10.5)

## 2014-10-25 LAB — PSA, TOTAL AND FREE
PSA, Free Pct: 44 % (ref >25)
PSA, Free: 0.21 ng/mL
PSA: 0.48 ng/mL (ref <=4.00)

## 2014-10-28 ENCOUNTER — Ambulatory Visit: Payer: No Typology Code available for payment source | Admitting: Sports Medicine

## 2014-11-03 ENCOUNTER — Other Ambulatory Visit: Payer: Self-pay | Admitting: Sports Medicine

## 2014-11-04 ENCOUNTER — Other Ambulatory Visit: Payer: Self-pay | Admitting: Sports Medicine

## 2014-11-06 ENCOUNTER — Ambulatory Visit (INDEPENDENT_AMBULATORY_CARE_PROVIDER_SITE_OTHER): Payer: 59 | Admitting: Sports Medicine

## 2014-11-06 VITALS — BP 121/74 | HR 67 | Wt 274.0 lb

## 2014-11-06 DIAGNOSIS — E291 Testicular hypofunction: Secondary | ICD-10-CM

## 2014-11-06 MED ORDER — TESTOSTERONE CYPIONATE 200 MG/ML IM SOLN
200.0000 mg | Freq: Once | INTRAMUSCULAR | Status: AC
  Administered 2014-11-06: 200 mg via INTRAMUSCULAR

## 2014-11-06 NOTE — Assessment & Plan Note (Signed)
Testosterone injection as above. 

## 2014-11-06 NOTE — Progress Notes (Signed)
   Subjective:    Patient ID: Richard Beltran, male    DOB: 1966-04-04, 48 y.o.   MRN: [id number]  HPI  MOUNIR SKIPPER is here for a testosterone injection. Denies chest pain, shortness of breath, headaches or mood changes.   Review of Systems     Objective:   Physical Exam        Assessment & Plan:  Patient tolerated injection well without complications. Patient advised to schedule next injection 14 days from today.

## 2014-11-18 ENCOUNTER — Other Ambulatory Visit (INDEPENDENT_AMBULATORY_CARE_PROVIDER_SITE_OTHER): Payer: 59

## 2014-11-18 ENCOUNTER — Telehealth: Payer: Self-pay | Admitting: Family

## 2014-11-18 ENCOUNTER — Ambulatory Visit (INDEPENDENT_AMBULATORY_CARE_PROVIDER_SITE_OTHER): Payer: 59 | Admitting: Family

## 2014-11-18 ENCOUNTER — Encounter: Payer: Self-pay | Admitting: Family

## 2014-11-18 VITALS — BP 120/80 | HR 64 | Temp 97.9°F | Resp 18 | Ht 73.0 in | Wt 273.0 lb

## 2014-11-18 DIAGNOSIS — E669 Obesity, unspecified: Secondary | ICD-10-CM

## 2014-11-18 LAB — CORTISOL: Cortisol, Plasma: 7.2 ug/dL

## 2014-11-18 LAB — TSH: TSH: 1.61 u[IU]/mL (ref 0.35–4.50)

## 2014-11-18 NOTE — Progress Notes (Signed)
Subjective:    Patient ID: Richard Beltran, male    DOB: 1965/11/06, 49 y.o.   MRN: [id number]  Chief Complaint  Patient presents with  . Establish Care    wants to talk about weight loss    HPI:  Richard Beltran is a 49 y.o. male with a PMH of hypertension, low testosterone, pulmonary nodule, lumbar degenerative disc disease and excessive daytime sleepiness who presents today for an office visit to establish care.  1.) Obesity - Associated symptom of obesity has been going on for several years. Modifying factors include lifestyle changes which did not help very much. Severity of symptoms prevent him from completing some activities he desires. Currently averages about 2 meals per day consisting of coffee, starch and fruit, fish, potatoes, and rice. Physical activity with no current structured plan, however his job keeps him moving.   No Known Allergies   Outpatient Prescriptions Prior to Visit  Medication Sig Dispense Refill  . amLODipine (NORVASC) 10 MG tablet TAKE 1 TABLET BY MOUTH EVERY DAY 30 tablet 2  . carvedilol (COREG) 25 MG tablet Take 1 tablet (25 mg total) by mouth 2 (two) times daily with a meal. 60 tablet 0  . lisinopril-hydrochlorothiazide (PRINZIDE,ZESTORETIC) 20-25 MG per tablet TAKE 1 TABLET BY MOUTH EVERY DAY 30 tablet 0  . testosterone cypionate (DEPOTESTOSTERONE CYPIONATE) 200 MG/ML injection Inject 200 mg into the muscle every 14 (fourteen) days.    . traMADol (ULTRAM) 50 MG tablet TAKE 1 TO 2 TABLETS EVERY 8 HOURS MAXOF 6 TABS PER DAY 90 tablet 0   No facility-administered medications prior to visit.     Past Medical History  Diagnosis Date  . Hypertension   . Male hypogonadism   . Lumbar degenerative disc disease 10/29/2013    Post L4-L5 fusion.      Past Surgical History  Procedure Laterality Date  . Back surgery       Family History  Problem Relation Age of Onset  . Diabetes Mother   . Hyperlipidemia Mother   . Healthy Father   .  Healthy Maternal Grandmother   . Healthy Maternal Grandfather   . Healthy Paternal Grandmother   . Healthy Paternal Grandfather      History   Social History  . Marital Status: Married    Spouse Name: N/A  . Number of Children: 4  . Years of Education: 18+   Occupational History  . Paster    Social History Main Topics  . Smoking status: Never Smoker   . Smokeless tobacco: Never Used  . Alcohol Use: No  . Drug Use: No  . Sexual Activity: Not on file   Other Topics Concern  . Not on file   Social History Narrative   Fun: Golf and read   Denies religious beliefs effecting healthcare.     Review of Systems  Constitutional: Negative for unexpected weight change.  Respiratory: Negative for chest tightness and shortness of breath.   Cardiovascular: Negative for chest pain, palpitations and leg swelling.      Objective:    BP 120/80 mmHg  Pulse 64  Temp(Src) 97.9 F (36.6 C) (Oral)  Resp 18  Ht 6\' 1"  (1.854 m)  Wt 273 lb (123.832 kg)  BMI 36.03 kg/m2  SpO2 95% Nursing note and vital signs reviewed.  Physical Exam  Constitutional: He is oriented to person, place, and time. He appears well-developed and well-nourished. No distress.  Obese male seated in the chair, appears his stated age  and is dressed appropriately for the situation.   Cardiovascular: Normal rate, regular rhythm, normal heart sounds and intact distal pulses.   Pulmonary/Chest: Effort normal and breath sounds normal.  Neurological: He is alert and oriented to person, place, and time.  Skin: Skin is warm and dry.  Psychiatric: He has a normal mood and affect. His behavior is normal. Judgment and thought content normal.       Assessment & Plan:   Problem List Items Addressed This Visit      Other   Obesity - Primary    Presents with goal to lose weight. Discussed importance of lifestyle changes and increasing nutrient density and decreasing saturated fat while continuing to consume enough  calories. Start calorie tracking. Increase physical activity to 30 minutes most days of the week. Discussed risks and benefits of medication including Belviq and Contrave. Phentermine not currently an option secondary to history of hypertension and testosterone replacement therapy. Start lifestyle changes and follow up in 1 month or sooner if would like to discuss medication.       Relevant Orders   Cortisol (Completed)   TSH (Completed)

## 2014-11-18 NOTE — Patient Instructions (Addendum)
Thank you for choosing [facility].  Summary/Instructions:  Please track your caloric intake with [employer]itness[employer]. Please increase physical to 30 minutes most days of the week.  Research Contrave and Belviq as below.   Please stop by the lab on the basement level of the building for your blood work. Your results will be released to [place] [facility] (or called to you) after review, usually within 72 hours after test completion. If any changes need to be made, you will be notified at that same time.  If your symptoms worsen or fail to improve, please contact our office for further instruction, or in case of emergency go directly to the emergency room at the closest medical facility.   Bupropion; Naltrexone extended-release tablets What is this medicine? BUPROPION; NALTREXONE (byoo PROE pee on; nal TREX one) is a combination product used to promote and maintain weight loss in obese adults or overweight adults who also have weight related medical problems. This medicine should be used with a reduced calorie diet and increased physical activity. This medicine may be used for other purposes; ask your health care provider or pharmacist if you have questions. COMMON BRAND NAME(S): CONTRAVE What should I tell my health care provider before I take this medicine? They need to know if you have any of these conditions: -an eating disorder, such as anorexia or bulimia -diabetes -glaucoma -head injury -heart disease -high blood pressure -history of a drug or alcohol abuse problem -history of a tumor or infection of your brain or spine -history of stroke -history of irregular heartbeat -kidney disease -liver disease -mental illness such as bipolar disorder or psychosis -seizures -suicidal thoughts, plans, or attempt; a previous suicide attempt by you or a family member -an unusual or allergic reaction to bupropion, naltrexone, other medicines, foods, dyes, or preservatives breast-feeding -pregnant or  trying to become pregnant How should I use this medicine? Take this medicine by mouth with a glass of water. Follow the directions on the prescription label. Take this medicine in the morning and in the evenings as directed by your healthcare professional. Dennis Bast can take it with or without food. Do not take with high-fat meals as this may increase your risk of seizures. Do not crush, chew, or cut these tablets. Do not take your medicine more often than directed. Do not stop taking this medicine suddenly except upon the advice of your doctor. A special MedGuide will be given to you by the pharmacist with each prescription and refill. Be sure to read this information carefully each time. Talk to your pediatrician regarding the use of this medicine in children. Special care may be needed. Overdosage: If you think you've taken too much of this medicine contact a poison control center or emergency room at once. Overdosage: If you think you have taken too much of this medicine contact a poison control center or emergency room at once. NOTE: This medicine is only for you. Do not share this medicine with others. What if I miss a dose? If you miss a dose, skip the missed dose and take your next tablet at the regular time. Do not take double or extra doses. What may interact with this medicine? Do not take this medicine with any of the following medications: -any prescription or street opioid drug like codiene, heroin, methadone -linezolid -MAOIs like Carbex, Eldepryl, Marplan, Nardil, and Parnate -methylene blue (injected into a vein) -other medicines that contain bupropion like Zyban or Wellbutrin This medicine may also interact with the following medications: -alcohol -certain  medicines for anxiety or sleep -certain medicines for blood pressure like metoprolol, propranolol -certain medicines for depression or psychotic disturbances -certain medicines for HIV or AIDS like efavirenz, lopinavir,  nelfinavir, ritonavir -certain medicines for irregular heart beat like propafenone, flecainide -certain medicines for Parkinson's disease like amantadine, levodopa -certain medicines for seizures like carbamazepine, phenytoin, phenobarbital -cimetidine -clopidogrel -cyclophosphamide -disulfiram -furazolidone -isoniazid -nicotine -orphenadrine -procarbazine -steroid medicines like prednisone or cortisone -stimulant medicines for attention disorders, weight loss, or to stay awake -tamoxifen -theophylline -thioridazine -thiotepa -ticlopidine -tramadol -warfarin This list may not describe all possible interactions. Give your health care provider a list of all the medicines, herbs, non-prescription drugs, or dietary supplements you use. Also tell them if you smoke, drink alcohol, or use illegal drugs. Some items may interact with your medicine. What should I watch for while using this medicine? This medicine is intended to be used in addition to a healthy diet and appropriate exercise. The best results are achieved this way. Do not increase or in any way change your dose without consulting your doctor or health care professional. Do not take this medicine with other prescription or over-the-counter weight loss products without consulting your doctor or health care professional. Your doctor should tell you to stop taking this medicine if you do not lose a certain amount of weight within the first 12 weeks of treatment. Visit your doctor or health care professional for regular checkups. Your doctor may order blood tests or other tests to see how you are doing. This medicine may affect blood sugar levels. If you have diabetes, check with your doctor or health care professional before you change your diet or the dose of your diabetic medicine. Patients and their families should watch out for new or worsening depression or thoughts of suicide. Also watch out for sudden changes in feelings such as  feeling anxious, agitated, panicky, irritable, hostile, aggressive, impulsive, severely restless, overly excited and hyperactive, or not being able to sleep. If this happens, especially at the beginning of treatment or after a change in dose, call your health care professional. Avoid alcoholic drinks while taking this medicine. Drinking large amounts of alcoholic beverages, using sleeping or anxiety medicines, or quickly stopping the use of these agents while taking this medicine may increase your risk for a seizure. What side effects may I notice from receiving this medicine? Side effects that you should report to your doctor or health care professional as soon as possible: -allergic reactions like skin rash, itching or hives, swelling of the face, lips, or tongue -breathing problems -changes in vision, hearing -chest pain -confusion -dark urine -depressed mood -fast or irregular heart beat -fever -hallucination, loss of contact with reality -increased blood pressure -light-colored stools -redness, blistering, peeling or loosening of the skin, including inside the mouth -right upper belly pain -seizures -suicidal thoughts or other mood changes -unusually weak or tired -vomiting -yellowing of the eyes or skin Side effects that usually do not require medical attention (Report these to your doctor or health care professional if they continue or are bothersome.): -constipation -diarrhea -dizziness -dry mouth -headache -nausea -trouble sleeping This list may not describe all possible side effects. Call your doctor for medical advice about side effects. You may report side effects to FDA at 1-800-FDA-1088. Where should I keep my medicine? Keep out of the reach of children. Store at room temperature between 15 and 30 degrees C (59 and 86 degrees F). Throw away any unused medicine after the expiration date. NOTE: This sheet  is a summary. It may not cover all possible information. If you  have questions about this medicine, talk to your doctor, pharmacist, or health care provider.  2015, Elsevier/Gold Standard. (2013-01-24 15:17:29)  Lorcaserin oral tablets What is this medicine? LORCASERIN (lor ca SER in) is used to promote and maintain weight loss in obese patients. This medicine should be used with a reduced calorie diet and, if appropriate, an exercise program. This medicine may be used for other purposes; ask your health care provider or pharmacist if you have questions. COMMON BRAND NAME(S): Belviq What should I tell my health care provider before I take this medicine? They need to know if you have any of these conditions: -anatomical deformation of the penis, Peyronie's disease, or history of priapism (painful and prolonged erection) -diabetes -heart disease -history of blood diseases, like sickle cell anemia or leukemia -history of irregular heartbeat -kidney disease -liver disease -suicidal thoughts, plans, or attempt; a previous suicide attempt by you or a family member -an unusual or allergic reaction to lorcaserin, other medicines, foods, dyes, or preservatives -pregnant or trying to get pregnant -breast-feeding How should I use this medicine? Take this medicine by mouth with a glass of water. Follow the directions on the prescription label. You can take it with or without food. Take your medicine at regular intervals. Do not take it more often than directed. Do not stop taking except on your doctor's advice. Talk to your pediatrician regarding the use of this medicine in children. Special care may be needed. Overdosage: If you think you've taken too much of this medicine contact a poison control center or emergency room at once. Overdosage: If you think you have taken too much of this medicine contact a poison control center or emergency room at once. NOTE: This medicine is only for you. Do not share this medicine with others. What if I miss a dose? If you  miss a dose, take it as soon as you can. If it is almost time for your next dose, take only that dose. Do not take double or extra doses. What may interact with this medicine? -cabergoline -certain medicines for depression, anxiety, or psychotic disturbances -certain medicines for erectile dysfunction -certain medicines for migraine headache like almotriptan, eletriptan, frovatriptan, naratriptan, rizatriptan, sumatriptan, zolmitriptan -dextromethorphan -linezolid -MAOIs like Carbex, Eldepryl, Marplan, Nardil, and Parnate -medicines for diabetes -orlistat -tramadol -St. John's Wort -stimulant medicines for attention disorders, weight loss, or to stay awake This list may not describe all possible interactions. Give your health care provider a list of all the medicines, herbs, non-prescription drugs, or dietary supplements you use. Also tell them if you smoke, drink alcohol, or use illegal drugs. Some items may interact with your medicine. What should I watch for while using this medicine? This medicine is intended to be used in addition to a healthy diet and appropriate exercise. The best results are achieved this way. Do not increase or in any way change your dose without consulting your doctor or health care professional. Your doctor should tell you to stop taking this medicine if you do not lose a certain amount of weight within the first 12 weeks of treatment. Visit your doctor or health care professional for regular checkups. Your doctor may order blood tests or other tests to see how you are doing. Do not drive, use machinery, or do anything that needs mental alertness until you know how this medicine affects you. This medicine may affect blood sugar levels. If you have diabetes, check  with your doctor or health care professional before you change your diet or the dose of your diabetic medicine. Patients and their families should watch out for worsening depression or thoughts of suicide.  Also watch out for sudden changes in feelings such as feeling anxious, agitated, panicky, irritable, hostile, aggressive, impulsive, severely restless, overly excited and hyperactive, or not being able to sleep. If this happens, especially at the beginning of treatment or after a change in dose, call your health care professional. Contact you doctor or health care professional right away if the erection lasts longer than 4 hours or if it becomes painful. This may be a sign of serious problem and must be treated right away to prevent permanent damage. What side effects may I notice from receiving this medicine? Side effects that you should report to your doctor or health care professional as soon as possible: -allergic reactions like skin rash, itching or hives, swelling of the face, lips, or tongue -abnormal production of milk -breast enlargement in both males and females -breathing problems -changes in emotions or moods -changes in vision -confusion -erection lasting more than 4 hours -fast or irregular heart beat -feeling faint or lightheaded, falls -fever or chills, sore throat -hallucination, loss of contact with reality -high or low blood pressure -menstrual changes -restlessness -seizures -slow or irregular heartbeat -stiff muscles -sweating -suicidal thoughts or other mood changes -tremors -trouble walking -unusually weak or tired -vomiting Side effects that usually do not require medical attention (Report these to your doctor or health care professional if they continue or are bothersome.): -back pain -constipation -cough -dizziness -dry mouth -low blood sugar if you have diabetes (ask your doctor or healthcare professional for a list of these symptoms) -nausea -tiredness This list may not describe all possible side effects. Call your doctor for medical advice about side effects. You may report side effects to FDA at 1-800-FDA-1088. Where should I keep my medicine? Keep  out of the reach of children. Store at room temperature between 15 and 30 degrees C (59 and 86 degrees F). Throw away any unused medicine after the expiration date. NOTE: This sheet is a summary. It may not cover all possible information. If you have questions about this medicine, talk to your doctor, pharmacist, or health care provider.  2015, Elsevier/Gold Standard. (2010-11-03 17:15:17)

## 2014-11-18 NOTE — Assessment & Plan Note (Signed)
Presents with goal to lose weight. Discussed importance of lifestyle changes and increasing nutrient density and decreasing saturated fat while continuing to consume enough calories. Start calorie tracking. Increase physical activity to 30 minutes most days of the week. Discussed risks and benefits of medication including Belviq and Contrave. Phentermine not currently an option secondary to history of hypertension and testosterone replacement therapy. Start lifestyle changes and follow up in 1 month or sooner if would like to discuss medication.

## 2014-11-18 NOTE — Telephone Encounter (Signed)
Please inform patient that his thyroid and cortisol are within the normal ranges, indicating his thyroid should not be a limiting factor for weight loss.

## 2014-11-18 NOTE — Progress Notes (Signed)
Pre visit review using our clinic review tool, if applicable. No additional management support is needed unless otherwise documented below in the visit note. 

## 2014-11-19 MED ORDER — LORCASERIN HCL 10 MG PO TABS: 10.0000 mg | ORAL_TABLET | Freq: Two times a day (BID) | ORAL | Status: DC

## 2014-11-19 NOTE — Telephone Encounter (Signed)
Rx sent. Pt aware.  

## 2014-11-19 NOTE — Telephone Encounter (Signed)
Medication printed and signed.

## 2014-11-19 NOTE — Telephone Encounter (Signed)
Pt aware of blood work and would like weight loss medication sent to the pharmacy listed on file.

## 2014-11-20 ENCOUNTER — Ambulatory Visit: Payer: No Typology Code available for payment source

## 2014-11-26 ENCOUNTER — Other Ambulatory Visit: Payer: Self-pay | Admitting: Sports Medicine

## 2014-11-27 ENCOUNTER — Other Ambulatory Visit: Payer: Self-pay | Admitting: Sports Medicine

## 2014-11-28 ENCOUNTER — Ambulatory Visit (INDEPENDENT_AMBULATORY_CARE_PROVIDER_SITE_OTHER): Payer: 59 | Admitting: Sports Medicine

## 2014-11-28 VITALS — BP 121/82 | HR 67 | Wt 271.0 lb

## 2014-11-28 DIAGNOSIS — E291 Testicular hypofunction: Secondary | ICD-10-CM | POA: Diagnosis not present

## 2014-11-28 MED ORDER — TESTOSTERONE CYPIONATE 200 MG/ML IM SOLN
200.0000 mg | Freq: Once | INTRAMUSCULAR | Status: AC
  Administered 2014-11-28: 200 mg via INTRAMUSCULAR

## 2014-11-28 NOTE — Assessment & Plan Note (Signed)
Testosterone injection as above. 

## 2014-11-28 NOTE — Progress Notes (Signed)
   Subjective:    Patient ID: Richard Beltran, male    DOB: [birth year], 49 y.o.   MRN: [id number]  HPI  Patient came into office today for testosterone injection. Denies chest pain, shortness of breath, headaches and problems associated with taking this medication. Patient states he has had no abnornal mood swings.   Review of Systems     Objective:   Physical Exam        Assessment & Plan:  Patient tolerated injection in [place] well without complications. Patient advised to schedule his next injection for 2 weeks from today.

## 2014-12-03 ENCOUNTER — Other Ambulatory Visit: Payer: Self-pay | Admitting: Sports Medicine

## 2014-12-10 ENCOUNTER — Ambulatory Visit (INDEPENDENT_AMBULATORY_CARE_PROVIDER_SITE_OTHER): Payer: 59 | Admitting: Sports Medicine

## 2014-12-10 VITALS — BP 132/79 | HR 69 | Wt 274.0 lb

## 2014-12-10 DIAGNOSIS — E291 Testicular hypofunction: Secondary | ICD-10-CM

## 2014-12-10 MED ORDER — TESTOSTERONE CYPIONATE 200 MG/ML IM SOLN
200.0000 mg | Freq: Once | INTRAMUSCULAR | Status: AC
  Administered 2014-12-10: 200 mg via INTRAMUSCULAR

## 2014-12-10 NOTE — Progress Notes (Signed)
   Subjective:    Patient ID: Richard Beltran, male    DOB: [birth year], 49 y.o.   MRN: [id number]  HPI  Patient came into office today for testosterone injection. Denies chest pain, shortness of breath, headaches and problems associated with taking this medication. Patient states he has had no abnornal mood swings. Pt is two days earlier but is going out of town so requested to get injection early. Per PCP it is OK to administer early.   Review of Systems     Objective:   Physical Exam        Assessment & Plan:  Patient tolerated injection in [place] well without complications. Patient advised to schedule his next injection for 2 weeks from today.

## 2014-12-10 NOTE — Assessment & Plan Note (Signed)
Testosterone injection as above. 

## 2014-12-12 ENCOUNTER — Ambulatory Visit: Payer: 59

## 2014-12-23 ENCOUNTER — Telehealth: Payer: Self-pay | Admitting: Family

## 2014-12-23 ENCOUNTER — Ambulatory Visit: Payer: 59 | Admitting: Family

## 2014-12-23 NOTE — Telephone Encounter (Signed)
Patient now showed for 1 month fu.  Please advise.

## 2014-12-23 NOTE — Telephone Encounter (Signed)
Ok to reschedule if he calls back 

## 2014-12-24 ENCOUNTER — Ambulatory Visit: Payer: 59

## 2014-12-24 NOTE — Telephone Encounter (Signed)
Noted  

## 2014-12-26 ENCOUNTER — Encounter: Payer: Self-pay | Admitting: *Deleted

## 2014-12-26 ENCOUNTER — Emergency Department (INDEPENDENT_AMBULATORY_CARE_PROVIDER_SITE_OTHER)
Admission: EM | Admit: 2014-12-26 | Discharge: 2014-12-26 | Disposition: A | Discharge status: Home / Self Care | Payer: 59 | Source: Home / Self Care | Attending: Family Medicine | Admitting: Family Medicine

## 2014-12-26 ENCOUNTER — Ambulatory Visit (INDEPENDENT_AMBULATORY_CARE_PROVIDER_SITE_OTHER): Payer: 59 | Admitting: Sports Medicine

## 2014-12-26 VITALS — BP 126/80 | HR 72 | Wt 273.0 lb

## 2014-12-26 DIAGNOSIS — M7501 Adhesive capsulitis of right shoulder: Secondary | ICD-10-CM

## 2014-12-26 DIAGNOSIS — E291 Testicular hypofunction: Secondary | ICD-10-CM | POA: Diagnosis not present

## 2014-12-26 DIAGNOSIS — M25511 Pain in right shoulder: Secondary | ICD-10-CM | POA: Diagnosis not present

## 2014-12-26 MED ORDER — TESTOSTERONE CYPIONATE 200 MG/ML IM SOLN
200.0000 mg | Freq: Once | INTRAMUSCULAR | Status: AC
  Administered 2014-12-26: 200 mg via INTRAMUSCULAR

## 2014-12-26 MED ORDER — TRAMADOL HCL 50 MG PO TABS: 50.0000 mg | ORAL_TABLET | Freq: Three times a day (TID) | ORAL | Status: DC | PRN

## 2014-12-26 MED ORDER — KETOROLAC TROMETHAMINE 60 MG/2ML IM SOLN
60.0000 mg | Freq: Once | INTRAMUSCULAR | Status: AC
Start: 2014-12-26 — End: 2014-12-26
  Administered 2014-12-26: 60 mg via INTRAMUSCULAR

## 2014-12-26 MED ORDER — HYDROCODONE-ACETAMINOPHEN 7.5-300 MG PO TABS: 1.0000 | ORAL_TABLET | Freq: Four times a day (QID) | ORAL | Status: DC | PRN

## 2014-12-26 NOTE — Progress Notes (Signed)
   Subjective:    Patient ID: Richard Beltran, male    DOB: 26-May-1965, 49 y.o.   MRN: [id number]  HPI  Richard Beltran is here for a testosterone injection. Denies chest pain, shortness of breath, headaches or mood changes.   Review of Systems     Objective:   Physical Exam        Assessment & Plan:  Patient tolerated injection well without complications. Patient advised to schedule next injection 14 days from today.

## 2014-12-26 NOTE — ED Notes (Signed)
Pt c/o right shoulder pain, weakness and numbness without injury x today. 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 He sees Dr. Dianah Field who was unavailable today.

## 2014-12-26 NOTE — ED Provider Notes (Signed)
CSN: [id number]     Arrival date & time 12/26/14  1542 History   First MD Initiated Contact with Patient 12/26/14 1607     Chief Complaint  Patient presents with  . Shoulder Pain      HPI Comments: Patient complains of sudden onset of right shoulder pain about one hour ago without precipitating event.  He has a past history of "locked shoulder."  Patient is a 49 y.o. male presenting with shoulder pain. The history is provided by the patient.  Shoulder Pain Location:  Shoulder Time since incident:  1 hour Injury: no   Shoulder location:  R shoulder Pain details:    Quality:  Aching   Radiates to:  R elbow   Severity:  Severe   Onset quality:  Sudden   Duration:  1 hour   Timing:  Constant   Progression:  Unchanged Chronicity:  Recurrent Handedness:  Right-handed Prior injury to area:  No Relieved by:  Nothing Worsened by:  Movement Ineffective treatments:  None tried Associated symptoms: decreased range of motion and stiffness   Associated symptoms: no fever, no muscle weakness, no numbness and no tingling     Past Medical History  Diagnosis Date  . Hypertension   . Male hypogonadism   . Lumbar degenerative disc disease 10/29/2013    Post L4-L5 fusion.    Past Surgical History  Procedure Laterality Date  . Back surgery     Family History  Problem Relation Age of Onset  . Diabetes Mother   . Hyperlipidemia Mother   . Healthy Father   . Healthy Maternal Grandmother   . Healthy Maternal Grandfather   . Healthy Paternal Grandmother   . Healthy Paternal Grandfather    Social History  Substance Use Topics  . Smoking status: Never Smoker   . Smokeless tobacco: Never Used  . Alcohol Use: No    Review of Systems  Constitutional: Negative for fever.  Musculoskeletal: Positive for stiffness.  All other systems reviewed and are negative.   Allergies  Review of patient's allergies indicates no known allergies.  Home Medications   Prior to Admission medications    Medication Sig Start Date End Date Taking? Authorizing Provider  amLODipine (NORVASC) 10 MG tablet TAKE 1 TABLET BY MOUTH EVERY DAY 11/05/14   Silverio Decamp, MD  carvedilol (COREG) 25 MG tablet TAKE 1 TABLET (25 MG TOTAL) BY MOUTH 2 (TWO) TIMES DAILY WITH A MEAL. 12/03/14   Silverio Decamp, MD  Hydrocodone-Acetaminophen 7.5-300 MG TABS Take 1 tablet by mouth every 6 (six) hours as needed. 12/26/14   Kandra Nicolas, MD  lisinopril-hydrochlorothiazide (PRINZIDE,ZESTORETIC) 20-25 MG per tablet TAKE 1 TABLET BY MOUTH EVERY DAY 12/03/14   Silverio Decamp, MD  Lorcaserin HCl 10 MG TABS Take 10 mg by mouth 2 (two) times daily. 11/19/14   Golden Circle, FNP  meloxicam (MOBIC) 15 MG tablet TAKE 1 TABLET BY MOUTH EACH MORNING WITH BREAKFAST FOR 2 WEEKS THEN DAILY AS NEEDED 11/26/14   Silverio Decamp, MD  testosterone cypionate (DEPOTESTOSTERONE CYPIONATE) 200 MG/ML injection Inject 200 mg into the muscle every 14 (fourteen) days.    Historical Provider, MD  traMADol (ULTRAM) 50 MG tablet Take 1-2 tablets (50-100 mg total) by mouth every 8 (eight) hours as needed. 12/26/14   Silverio Decamp, MD   Meds Ordered and Administered this Visit   Medications  ketorolac (TORADOL) injection 60 mg (60 mg Intramuscular Given 12/26/14 1623)    BP 138/81  mmHg  Pulse 73  Resp 20  Wt 273 lb (123.832 kg)  SpO2 99% No data found.   Physical Exam  Constitutional: He is oriented to person, place, and time. He appears well-developed and well-nourished. He appears distressed.  HENT:  Head: Normocephalic.  Eyes: Pupils are equal, round, and reactive to light.  Neck: Normal range of motion.  Cardiovascular: Normal heart sounds.   Pulmonary/Chest: Breath sounds normal.  Musculoskeletal:       Right shoulder: He exhibits decreased range of motion, tenderness, pain, spasm and decreased strength. He exhibits no bony tenderness, no swelling, no deformity and normal pulse.       Arms: Patient  unable to abduct the right shoulder either actively or passively.  He is also unable to internally or externally rotate his right shoulder. There is tenderness to palpation over the sub-acromial bursa. Distal neurovascular function is intact.   Neurological: He is alert and oriented to person, place, and time.  Skin: Skin is warm and dry. No rash noted. No erythema.  Nursing note and vitals reviewed.   ED Course  Procedures  none       MDM   1. Adhesive capsulitis of shoulder, right   2. Right shoulder pain    Toradol 60mg  IM.  Rx for Lortab 7.5-300, one Q6hr prn pain Apply ice pack for 20 to 30 minutes, 3 to 4 times daily  Continue until pain decreases.  Followup with Dr. Aundria Mems tomorrow morning 9:30am   Kandra Nicolas, MD 12/26/14 [phone]

## 2014-12-26 NOTE — ED Notes (Signed)
Appt Fri, Dec 27, 2014 Dt T 9:30 am

## 2014-12-26 NOTE — Discharge Instructions (Signed)
Apply ice pack for 20 to 30 minutes, 3 to 4 times daily  Continue until pain decreases.    Adhesive Capsulitis Sometimes the shoulder becomes stiff and is painful to move. Some people say it feels as if the shoulder is frozen in place. Because of this, the condition is called "frozen shoulder." Its medical name is adhesive capsulitis.  The shoulder joint is made up of strong connective tissue that attaches the ball of the humerus to the shallow shoulder socket. This strong connective tissue is called the joint capsule. This tissue can become stiff and swollen. That is when adhesive capsulitis sets in. CAUSES  It is not always clear just what the cause adhesive capsulitis. Possibilities include:  Injury to the shoulder joint.  Strain. This is a repetitive injury brought about by overuse.  Lack of use. Perhaps your arm or hand was otherwise injured. It might have been in a sling for awhile. Or perhaps you were not using it to avoid pain.  Referred pain. This is a sort of trick the body plays. You feel pain in the shoulder. But, the pain actually comes from an injury somewhere else in the body.  Long-standing health problems. Several diseases can cause adhesive capsulitis. They include diabetes, heart disease, stroke, thyroid problems, rheumatoid arthritis and lung disease.  Being a women older than 2. Anyone can develop adhesive capsulitis but it is most common in women in this age group. SYMPTOMS   Pain.  It occurs when the arm is moved.  Parts of the shoulder might hurt if they are touched.  Pain is worse at night or when resting.  Soreness. It might not be strong enough to be called pain. But, the shoulder aches.  The shoulder does not move freely.  Muscle spasms.  Trouble sleeping because of shoulder ache or pain. DIAGNOSIS  To decide if you have adhesive capsulitis, your healthcare provider will probably:  Ask about symptoms you have noticed.  Ask about your history of  joint pain and anything that might have caused the pain.  Ask about your overall health.  Use hands to feel your shoulder and neck.  Ask you to move your shoulder in specific directions. This may indicate the origin of the pain.  Order imaging tests; pictures of the shoulder. They help pinpoint the source of the problem. An X-ray might be used. For more detail, an MRI is often used. An MRI details the tendons, muscles and ligaments as well as the joint. TREATMENT  Adhesive capsulitis can be treated several ways. Most treatments can be done in a clinic or in your healthcare provider's office. Be sure to discuss the different options with your caregiver. They include:  Physical therapy. You will work on specific exercises to get your shoulder moving again. The exercises usually involve stretching. A physical therapist (a caregiver with special training) can show you what to do and what not to do. The exercises will need to be done daily.  Medication.  Over-the-counter medicines may relieve pain and inflammation (the body's way of reacting to injury or infection).  Corticosteroids. These are stronger drugs to reduce pain and inflammation. They are given by injection (shots) into the shoulder joint. Frequent treatment is not recommended.  Muscle relaxants. Medication may be prescribed to ease muscle spasms.  Treatment of underlying conditions. This means treating another condition that is causing your shoulder problem. This might be a rotator cuff (tendon) problem  Shoulder manipulation. The shoulder will be moved by your healthcare  provider. You would be under general anesthesia (given a drug that puts you to sleep). You would not feel anything. Sometimes the joint will be injected with salt water (saline) at high pressure to break down internal scarring in the joint capsule.  Surgery. This is rarely needed. It may be suggested in advanced cases after all other treatment has failed. PROGNOSIS   In time, most people recover from adhesive capsulitis. Sometimes, however, the pain goes away but full movement of the shoulder does not return.  HOME CARE INSTRUCTIONS   Take any pain medications recommended by your healthcare provider. Follow the directions carefully.  If you have physical therapy, follow through with the therapist's suggestions. Be sure you understand the exercises you will be doing. You should understand:  How often the exercises should be done.  How many times each exercise should be repeated.  How long they should be done.  What other activities you should do, or not do.  That you should warm up before doing any exercise. Just 5 to 10 minutes will help. Small, gentle movements should get your shoulder ready for more.  Avoid high-demand exercise that involves your shoulder such as throwing. This type of exercise can make pain worse.  Consider using cold packs. Cold may ease swelling and pain. Ask your healthcare provider if a cold pack might help you. If so, get directions on how and when to use them. SEEK MEDICAL CARE IF:   You have any questions about your medications.  Your pain continues to increase. Document Released: 02/14/2009 Document Revised: 07/12/2011 Document Reviewed: 02/14/2009 [facility] Patient Information 2015 [place], [place]. This information is not intended to replace advice given to you by your health care provider. Make sure you discuss any questions you have with your health care provider.

## 2014-12-26 NOTE — Assessment & Plan Note (Signed)
Testosterone injection as above. 

## 2014-12-27 ENCOUNTER — Ambulatory Visit (INDEPENDENT_AMBULATORY_CARE_PROVIDER_SITE_OTHER): Payer: 59 | Admitting: Sports Medicine

## 2014-12-27 ENCOUNTER — Encounter: Payer: Self-pay | Admitting: Sports Medicine

## 2014-12-27 DIAGNOSIS — M7501 Adhesive capsulitis of right shoulder: Secondary | ICD-10-CM

## 2014-12-27 NOTE — Assessment & Plan Note (Signed)
This happened one year ago, responded well to an ultrasound-guided glenohumeral injection. Repeat glenohumeral injection today. Physical therapy.  Return in one month.

## 2014-12-27 NOTE — Progress Notes (Signed)
  Subjective:    CC: right shoulder pain  HPI: This pleasant 49 year old male returns, I treated him over one year ago for a right adhesive capsulitis that responded well to a glenohumeral injection, yesterday he woke up with similar pain, and lack of range of motion in the right shoulder, localized at the anterior and posterior joint lines.  Pain is severe, persistent, and he does desire rapid interventional treatment today.  Past medical history, Surgical history, Family history not pertinant except as noted below, Social history, Allergies, and medications have been entered into the medical record, reviewed, and no changes needed.   Review of Systems: No fevers, chills, night sweats, weight loss, chest pain, or shortness of breath.   Objective:    General: Well Developed, well nourished, and in no acute distress.  Neuro: Alert and oriented x3, extra-ocular muscles intact, sensation grossly intact.  HEENT: Normocephalic, atraumatic, pupils equal round reactive to light, neck supple, no masses, no lymphadenopathy, thyroid nonpalpable.  Skin: Warm and dry, no rashes. Cardiac: Regular rate and rhythm, no murmurs rubs or gallops, no lower extremity edema.  Respiratory: Clear to auscultation bilaterally. Not using accessory muscles, speaking in full sentences. Right shoulder: External rotation to 20, flexion to 30, extension to 30, abduction to 40. All movements reproduce pain.  Procedure: Real-time Ultrasound Guided Injection of right glenohumeral joint Device: GE Logiq E  Verbal informed consent obtained.  Time-out conducted.  Noted no overlying erythema, induration, or other signs of local infection.  Skin prepped in a sterile fashion.  Local anesthesia: Topical Ethyl chloride.  With sterile technique and under real time ultrasound guidance:  Spinal needle advanced into the joint, 1 mL Kenalog 40, 4 mL lidocaine injected easily. Completed without difficulty  Pain immediately improved  suggesting accurate placement of the medication.  Advised to call if fevers/chills, erythema, induration, drainage, or persistent bleeding.  Images permanently stored and available for review in the ultrasound unit.  Impression: Technically successful ultrasound guided injection.  Impression and Recommendations:

## 2015-01-08 ENCOUNTER — Other Ambulatory Visit: Payer: Self-pay | Admitting: Sports Medicine

## 2015-01-09 ENCOUNTER — Ambulatory Visit (INDEPENDENT_AMBULATORY_CARE_PROVIDER_SITE_OTHER): Payer: 59 | Admitting: Sports Medicine

## 2015-01-09 VITALS — BP 130/88 | HR 71 | Wt 273.0 lb

## 2015-01-09 DIAGNOSIS — E291 Testicular hypofunction: Secondary | ICD-10-CM

## 2015-01-09 MED ORDER — TESTOSTERONE CYPIONATE 200 MG/ML IM SOLN
200.0000 mg | Freq: Once | INTRAMUSCULAR | Status: AC
Start: 2015-01-09 — End: 2015-01-09
  Administered 2015-01-09: 200 mg via INTRAMUSCULAR

## 2015-01-09 NOTE — Progress Notes (Signed)
   Subjective:    Patient ID: Richard Beltran, male    DOB: 04/12/1966, 49 y.o.   MRN: [id number]  HPI   Patient came into office today for testosterone injection. Denies chest pain, shortness of breath, headaches and problems associated with taking this medication. Patient states he has had no abnornal mood swings.     Review of Systems     Objective:   Physical Exam        Assessment & Plan:  Patient tolerated injection in [place]  well without complications. Patient advised to schedule his next injection for 2 weeks from today.

## 2015-01-10 ENCOUNTER — Ambulatory Visit: Payer: 59 | Admitting: Rehabilitative and Restorative Service Providers"

## 2015-01-13 ENCOUNTER — Ambulatory Visit (HOSPITAL_BASED_OUTPATIENT_CLINIC_OR_DEPARTMENT_OTHER): Payer: 59 | Attending: Sports Medicine

## 2015-01-13 VITALS — Ht 73.0 in | Wt 275.0 lb

## 2015-01-13 DIAGNOSIS — G4736 Sleep related hypoventilation in conditions classified elsewhere: Secondary | ICD-10-CM | POA: Diagnosis not present

## 2015-01-13 DIAGNOSIS — R0683 Snoring: Secondary | ICD-10-CM | POA: Diagnosis not present

## 2015-01-13 DIAGNOSIS — G4719 Other hypersomnia: Secondary | ICD-10-CM | POA: Diagnosis present

## 2015-01-13 DIAGNOSIS — G4733 Obstructive sleep apnea (adult) (pediatric): Secondary | ICD-10-CM

## 2015-01-18 DIAGNOSIS — G4733 Obstructive sleep apnea (adult) (pediatric): Secondary | ICD-10-CM | POA: Diagnosis not present

## 2015-01-18 NOTE — Progress Notes (Signed)
  Patient Name: Richard Beltran, Richard Beltran Date: 01/13/2015 Gender: Male D.O.B: [birth year] Age (years): 49 Referring Helon Wisinski: Gwen Her Thekkekandam Height (inches): 73 Interpreting Physician: Baird Lyons MD, ABSM Weight (lbs): 274 RPSGT: Madelon Lips BMI: 36 MRN: [id number] Neck Size: 19.00 CLINICAL INFORMATION Sleep Study Type: NPSG  Indication for sleep study: Excessive Daytime Sleepiness  Epworth Sleepiness Score: 11  SLEEP STUDY TECHNIQUE As per the AASM Manual for the Scoring of Sleep and Associated Events v2.3 (April 2016) with a hypopnea requiring 4% desaturations.  The channels recorded and monitored were frontal, central and occipital EEG, electrooculogram (EOG), submentalis EMG (chin), nasal and oral airflow, thoracic and abdominal wall motion, anterior tibialis EMG, snore microphone, electrocardiogram, and pulse oximetry.  MEDICATIONS Patient's medications include: charted for review. Medications self-administered by patient during sleep study : No sleep medicine administered.  SLEEP ARCHITECTURE The study was initiated at 11:08:48 PM and ended at 5:10:05 AM.  Sleep onset time was 17.9 minutes and the sleep efficiency was 80.0%. The total sleep time was 288.9 minutes.  Stage REM latency was 80.0 minutes.  The patient spent 9.87% of the night in stage N1 sleep, 74.56% in stage N2 sleep, 0.00% in stage N3 and 15.58% in REM.  Alpha intrusion was absent.  Supine sleep was 19.53%.  Awake after sleep onset 54.5 minutes  RESPIRATORY PARAMETERS The overall apnea/hypopnea index (AHI) was 21.6 per hour. There were 15 total apneas, including 13 obstructive, 2 central and 0 mixed apneas. There were 89 hypopneas and 3 RERAs.  The AHI during Stage REM sleep was 57.3 per hour.  AHI while supine was 59.5 per hour.  The mean oxygen saturation was 92.18%. The minimum SpO2 during sleep was 62.00%.  Loud snoring was noted during this study.  CARDIAC DATA The 2 lead EKG  demonstrated sinus rhythm. The mean heart rate was 61.28 beats per minute. Other EKG findings include: None.  LEG MOVEMENT DATA The total PLMS were 0 with a resulting PLMS index of 0.00. Associated arousal with leg movement index was 0.0 .  IMPRESSIONS Moderate obstructive sleep apnea occurred during this study (AHI = 21.6/h). There were not enough early events to meet protocol requirements for split CPAP titration on this study . No significant central sleep apnea occurred during this study (CAI = 0.4/h). Severe oxygen desaturation was noted during this study (Min O2 = 62.00%, Mean 92.18%). The patient snored with Loud snoring volume. No cardiac abnormalities were noted during this study. Clinically significant periodic limb movements did not occur during sleep. No significant associated arousals.   DIAGNOSIS Obstructive Sleep Apnea (327.23 [G47.33 ICD-10]) Nocturnal Hypoxemia (327.26 [G47.36 ICD-10])   RECOMMENDATIONS Therapeutic CPAP titration to determine optimal pressure required to alleviate sleep disordered breathing. Positional therapy avoiding supine position during sleep. Avoid alcohol, sedatives and other CNS depressants that may worsen sleep apnea and disrupt normal sleep architecture. Sleep hygiene should be reviewed to assess factors that may improve sleep quality. Weight management and regular exercise should be initiated or continued if appropriate.  Deneise Lever Diplomate, American Board of Sleep Medicine  ELECTRONICALLY SIGNED ON:  01/18/2015, 10:18 AM [place] PH: [phone]   FX: [phone] [place]

## 2015-01-22 ENCOUNTER — Ambulatory Visit: Payer: No Typology Code available for payment source | Admitting: Sports Medicine

## 2015-01-23 ENCOUNTER — Ambulatory Visit (INDEPENDENT_AMBULATORY_CARE_PROVIDER_SITE_OTHER): Payer: 59 | Admitting: Sports Medicine

## 2015-01-23 VITALS — BP 132/79 | HR 71 | Resp 16 | Wt 274.0 lb

## 2015-01-23 DIAGNOSIS — E291 Testicular hypofunction: Secondary | ICD-10-CM | POA: Diagnosis not present

## 2015-01-23 MED ORDER — TESTOSTERONE CYPIONATE 200 MG/ML IM SOLN
200.0000 mg | INTRAMUSCULAR | Status: DC
  Administered 2015-01-23: 200 mg via INTRAMUSCULAR

## 2015-01-23 NOTE — Progress Notes (Signed)
   Subjective:    Patient ID: Richard Beltran, male    DOB: 12-Feb-1966, 49 y.o.   MRN: [id number]  HPI Patient here for testosterone injection. Denies chest pain, shortness o breath, headaches and problems with medication or mood changes.   Review of Systems     Objective:   Physical Exam        Assessment & Plan:  Patient tolerated injection well without complications. Patient advised to schedule next injection in 14 days. Discussed Flu vaccination and given handout.

## 2015-01-27 ENCOUNTER — Encounter: Payer: Self-pay | Admitting: Sports Medicine

## 2015-01-27 ENCOUNTER — Ambulatory Visit (INDEPENDENT_AMBULATORY_CARE_PROVIDER_SITE_OTHER): Payer: 59 | Admitting: Sports Medicine

## 2015-01-27 VITALS — BP 138/79 | HR 70 | Ht 73.0 in | Wt 274.0 lb

## 2015-01-27 DIAGNOSIS — M7501 Adhesive capsulitis of right shoulder: Secondary | ICD-10-CM

## 2015-01-27 DIAGNOSIS — G4733 Obstructive sleep apnea (adult) (pediatric): Secondary | ICD-10-CM | POA: Diagnosis not present

## 2015-01-27 MED ORDER — TRAMADOL HCL 50 MG PO TABS: 50.0000 mg | ORAL_TABLET | Freq: Three times a day (TID) | ORAL | Status: DC | PRN

## 2015-01-27 MED ORDER — AMBULATORY NON FORMULARY MEDICATION: Status: DC

## 2015-01-27 NOTE — Assessment & Plan Note (Signed)
Referral for on CPAP with [facility]

## 2015-01-27 NOTE — Progress Notes (Signed)
  Subjective:    CC:  Follow-up  HPI: Right adhesive capsulitis: Completely resolved after glenohumeral injection.   Lumbar degenerative disc disease: Needs a refill on tramadol   Obstructive sleep apnea: positive with moderate sleep apnea and the high apnea-hypopnea index, needs CPAP.  Past medical history, Surgical history, Family history not pertinant except as noted below, Social history, Allergies, and medications have been entered into the medical record, reviewed, and no changes needed.   Review of Systems: No fevers, chills, night sweats, weight loss, chest pain, or shortness of breath.   Objective:    General: Well Developed, well nourished, and in no acute distress.  Neuro: Alert and oriented x3, extra-ocular muscles intact, sensation grossly intact.  HEENT: Normocephalic, atraumatic, pupils equal round reactive to light, neck supple, no masses, no lymphadenopathy, thyroid nonpalpable.  Skin: Warm and dry, no rashes. Cardiac: Regular rate and rhythm, no murmurs rubs or gallops, no lower extremity edema.  Respiratory: Clear to auscultation bilaterally. Not using accessory muscles, speaking in full sentences. Right Shoulder: Inspection reveals no abnormalities, atrophy or asymmetry. Palpation is normal with no tenderness over AC joint or bicipital groove. ROM is full in all planes. Rotator cuff strength normal throughout. No signs of impingement with negative Neer and Hawkin's tests, empty can. Speeds and Yergason's tests normal. No labral pathology noted with negative Obrien's, negative crank, negative clunk, and good stability. Normal scapular function observed. No painful arc and no drop arm sign. No apprehension sign  Impression and Recommendations:

## 2015-01-27 NOTE — Assessment & Plan Note (Signed)
Resolved with the glenohumeral injection.

## 2015-02-01 ENCOUNTER — Other Ambulatory Visit: Payer: Self-pay | Admitting: Sports Medicine

## 2015-02-06 ENCOUNTER — Ambulatory Visit (INDEPENDENT_AMBULATORY_CARE_PROVIDER_SITE_OTHER): Payer: Self-pay | Admitting: Sports Medicine

## 2015-02-06 VITALS — BP 129/84 | HR 69 | Wt 274.0 lb

## 2015-02-06 DIAGNOSIS — E291 Testicular hypofunction: Secondary | ICD-10-CM

## 2015-02-06 MED ORDER — TESTOSTERONE CYPIONATE 200 MG/ML IM SOLN: 200.0000 mg | Freq: Once | INTRAMUSCULAR | Status: DC

## 2015-02-06 MED ORDER — TESTOSTERONE CYPIONATE 200 MG/ML IM SOLN
200.0000 mg | Freq: Once | INTRAMUSCULAR | Status: AC
  Administered 2015-02-06: 200 mg via INTRAMUSCULAR

## 2015-02-06 NOTE — Progress Notes (Signed)
   Subjective:    Patient ID: Richard Beltran, male    DOB: January 30, 1966, 49 y.o.   MRN: [id number]  HPI  Richard Beltran is here for a testosterone injection.  Denies SOB, chest pain, mood changes, and problems with medication.  Review of Systems     Objective:   Physical Exam        Assessment & Plan:   Cardale tolerated injection well without any complications.  Pt advised to make 14 day appointment for next injection.

## 2015-02-20 ENCOUNTER — Ambulatory Visit (INDEPENDENT_AMBULATORY_CARE_PROVIDER_SITE_OTHER): Payer: 59 | Admitting: Family Medicine

## 2015-02-20 VITALS — BP 153/93 | HR 71 | Wt 278.0 lb

## 2015-02-20 DIAGNOSIS — E291 Testicular hypofunction: Secondary | ICD-10-CM

## 2015-02-20 MED ORDER — TESTOSTERONE CYPIONATE 200 MG/ML IM SOLN
200.0000 mg | INTRAMUSCULAR | Status: DC
  Administered 2015-02-20: 200 mg via INTRAMUSCULAR

## 2015-02-20 NOTE — Progress Notes (Signed)
Patient was in office for Testosterone injection. Patient denied any headaches, shortness of breath, or dizziness. Patient was given 200 mg of Depo Testosterone in  [place]. Rhonda Cunningham,CMA

## 2015-02-28 ENCOUNTER — Other Ambulatory Visit: Payer: Self-pay | Admitting: Sports Medicine

## 2015-03-06 ENCOUNTER — Encounter: Payer: Self-pay | Admitting: Osteopathic Medicine

## 2015-03-06 ENCOUNTER — Other Ambulatory Visit: Payer: Self-pay | Admitting: Sports Medicine

## 2015-03-06 ENCOUNTER — Ambulatory Visit: Payer: 59

## 2015-03-06 ENCOUNTER — Ambulatory Visit (INDEPENDENT_AMBULATORY_CARE_PROVIDER_SITE_OTHER): Payer: 59 | Admitting: Osteopathic Medicine

## 2015-03-06 VITALS — BP 139/76 | HR 80 | Temp 98.7°F | Wt 277.8 lb

## 2015-03-06 DIAGNOSIS — B9789 Other viral agents as the cause of diseases classified elsewhere: Principal | ICD-10-CM

## 2015-03-06 DIAGNOSIS — E291 Testicular hypofunction: Secondary | ICD-10-CM

## 2015-03-06 DIAGNOSIS — J019 Acute sinusitis, unspecified: Secondary | ICD-10-CM

## 2015-03-06 DIAGNOSIS — R5383 Other fatigue: Secondary | ICD-10-CM | POA: Diagnosis not present

## 2015-03-06 DIAGNOSIS — J069 Acute upper respiratory infection, unspecified: Secondary | ICD-10-CM | POA: Diagnosis not present

## 2015-03-06 MED ORDER — BENZONATATE 200 MG PO CAPS: 200.0000 mg | ORAL_CAPSULE | Freq: Three times a day (TID) | ORAL | Status: DC | PRN

## 2015-03-06 MED ORDER — GUAIFENESIN-CODEINE 100-10 MG/5ML PO SYRP: 5.0000 mL | ORAL_SOLUTION | Freq: Three times a day (TID) | ORAL | Status: DC | PRN

## 2015-03-06 MED ORDER — AMOXICILLIN-POT CLAVULANATE ER 1000-62.5 MG PO TB12: 2.0000 | ORAL_TABLET | Freq: Two times a day (BID) | ORAL | Status: DC

## 2015-03-06 MED ORDER — IPRATROPIUM BROMIDE 0.03 % NA SOLN: 2.0000 | Freq: Two times a day (BID) | NASAL | Status: DC

## 2015-03-06 MED ORDER — TESTOSTERONE CYPIONATE 200 MG/ML IM SOLN
200.0000 mg | INTRAMUSCULAR | Status: DC
  Administered 2015-03-06 (×2): 200 mg via INTRAMUSCULAR

## 2015-03-06 NOTE — Progress Notes (Signed)
HPI: Richard Beltran is a 49 y.o. male who presents to [facility]  today for chief complaint of:  Chief Complaint  Patient presents with  . Coughing    x 2 days   . Nasal Congestion   . Location: sinuses, chest . Quality: congestion, cough . Severity: moderate . Duration:2 days . Timing: intermittent . Context: no sick contacts he is aware . Assoc signs/symptoms: no fever/chills, hasn't tried OTC meds   Past medical, social and family history reviewed: Past Medical History  Diagnosis Date  . Hypertension   . Male hypogonadism   . Lumbar degenerative disc disease 10/29/2013    Post L4-L5 fusion.    Past Surgical History  Procedure Laterality Date  . Back surgery     Social History  Substance Use Topics  . Smoking status: Never Smoker   . Smokeless tobacco: Never Used  . Alcohol Use: No   Family History  Problem Relation Age of Onset  . Diabetes Mother   . Hyperlipidemia Mother   . Healthy Father   . Healthy Maternal Grandmother   . Healthy Maternal Grandfather   . Healthy Paternal Grandmother   . Healthy Paternal Grandfather     Current Outpatient Prescriptions  Medication Sig Dispense Refill  . AMBULATORY NON FORMULARY MEDICATION CPAP with humidifier,mask, supplies Set to auto-titrate with range of 4-20 cm water pressure.  DX G47.33 Obstructive Sleep Apnea Please do download after 10 days on CPAP 1 each 0  . amLODipine (NORVASC) 10 MG tablet TAKE 1 TABLET BY MOUTH EVERY DAY 30 tablet 2  . carvedilol (COREG) 25 MG tablet TAKE 1 TABLET (25 MG TOTAL) BY MOUTH 2 (TWO) TIMES DAILY WITH A MEAL. 180 tablet 2  . lisinopril-hydrochlorothiazide (PRINZIDE,ZESTORETIC) 20-25 MG tablet TAKE 1 TABLET BY MOUTH EVERY DAY 30 tablet 0  . Lorcaserin HCl 10 MG TABS Take 10 mg by mouth 2 (two) times daily. 60 tablet 0  . meloxicam (MOBIC) 15 MG tablet TAKE 1 TABLET BY MOUTH EACH MORNING WITH BREAKFAST FOR 2 WEEKS THEN DAILY AS NEEDED 90 tablet 1   . testosterone cypionate (DEPOTESTOTERONE CYPIONATE) 100 MG/ML injection Inject 200 mg into the muscle every 14 (fourteen) days. For IM use only    . traMADol (ULTRAM) 50 MG tablet TAKE 1- 2 TABLETS BY MOUTH EVERY 8 HOURS AS NEEDED 90 tablet 0   Current Facility-Administered Medications  Medication Dose Route Frequency Provider Last Rate Last Dose  . testosterone cypionate (DEPOTESTOSTERONE CYPIONATE) injection 200 mg  200 mg Intramuscular Q14 Days Emeterio Reeve, DO       No Known Allergies    Review of Systems: CONSTITUTIONAL:  No  fever, no chills, No  unintentional weight changes HEAD/EYES/EARS/NOSE/THROAT: No headache, no vision change, no hearing change, Yes  sore throat CARDIAC: No chest pain, no pressure/palpitations, no orthopnea RESPIRATORY: Yes  cough, No  shortness of breath/wheeze GASTROINTESTINAL: No nausea, no vomiting, no abdominal pain, no blood in stool, no diarrhea, no constipation MUSCULOSKELETAL: Yes generalized myalgia/arthralgia     Exam:  BP 139/76 mmHg  Pulse 80  Temp(Src) 98.7 F (37.1 C)  Wt 277 lb 12 oz (125.987 kg) Constitutional: VSS, see above. General Appearance: alert, well-developed, well-nourished, NAD Eyes: Normal lids and conjunctive, non-icteric sclera, PERRLA Ears, Nose, Mouth, Throat: Normal external inspection ears/nares/mouth/lips/gums, TM normal bilaterally, MMM, posterior pharynx Yes  erythema No  exudate Neck: No masses, trachea midline. No thyroid enlargement/tenderness/mass appreciated. No lymphadenopathy Respiratory: Normal respiratory effort. no wheeze, no  rhonchi, no rales Cardiovascular: S1/S2 normal, no murmur, no rub/gallop auscultated. RRR.    No results found for this or any previous visit (from the past 72 hour(s)).    ASSESSMENT/PLAN:  Viral URI with cough - Symptomatic treatment and supportive care reviewed with patient - Plan: guaiFENesin-codeine (ROBITUSSIN AC) 100-10 MG/5ML syrup, benzonatate (TESSALON) 200 MG  capsule, ipratropium (ATROVENT) 0.03 % nasal spray  Acute rhinosinusitis - unlikely bacterial - Plan: ipratropium (ATROVENT) 0.03 % nasal spray, amoxicillin-clavulanate (AUGMENTIN XR) 1000-62.5 MG 12 hr tablet, DISCONTINUED: amoxicillin-clavulanate (AUGMENTIN XR) 1000-62.5 MG 12 hr tablet  Other fatigue - likely viral illness  Male hypogonadism - Plan: testosterone cypionate (DEPOTESTOSTERONE CYPIONATE) injection 200 mg     Return if symptoms worsen or fail to improve, for visit as scheduled with Dr. Darene Lamer.

## 2015-03-20 ENCOUNTER — Ambulatory Visit (INDEPENDENT_AMBULATORY_CARE_PROVIDER_SITE_OTHER): Payer: 59 | Admitting: Sports Medicine

## 2015-03-20 VITALS — BP 140/85 | HR 74 | Resp 16 | Wt 278.0 lb

## 2015-03-20 DIAGNOSIS — E291 Testicular hypofunction: Secondary | ICD-10-CM

## 2015-03-20 MED ORDER — TESTOSTERONE CYPIONATE 200 MG/ML IM SOLN
200.0000 mg | INTRAMUSCULAR | Status: DC
  Administered 2015-03-20: 200 mg via INTRAMUSCULAR

## 2015-03-20 NOTE — Progress Notes (Signed)
   Subjective:    Patient ID: Richard Beltran, male    DOB: May 04, 1965, 49 y.o.   MRN: [id number]  HPI Patient here for testosterone injection. Denies chest pain, shortness of breath, headaches, mood changes and all problems with medications.   Review of Systems     Objective:   Physical Exam        Assessment & Plan:  Patient tolerated injection well without complications. Patient advised to schedule next injection in 14 days.

## 2015-03-29 ENCOUNTER — Other Ambulatory Visit: Payer: Self-pay | Admitting: Sports Medicine

## 2015-04-03 ENCOUNTER — Ambulatory Visit (INDEPENDENT_AMBULATORY_CARE_PROVIDER_SITE_OTHER): Payer: 59 | Admitting: Sports Medicine

## 2015-04-03 VITALS — BP 135/83 | HR 65 | Wt 280.0 lb

## 2015-04-03 DIAGNOSIS — E291 Testicular hypofunction: Secondary | ICD-10-CM

## 2015-04-03 MED ORDER — TESTOSTERONE CYPIONATE 200 MG/ML IM SOLN
200.0000 mg | Freq: Once | INTRAMUSCULAR | Status: AC
  Administered 2015-04-03: 200 mg via INTRAMUSCULAR

## 2015-04-03 NOTE — Progress Notes (Signed)
Pt tolerated injection with out any complications.

## 2015-04-10 ENCOUNTER — Other Ambulatory Visit: Payer: Self-pay | Admitting: Sports Medicine

## 2015-04-17 ENCOUNTER — Ambulatory Visit (INDEPENDENT_AMBULATORY_CARE_PROVIDER_SITE_OTHER): Payer: 59 | Admitting: Sports Medicine

## 2015-04-17 VITALS — BP 125/73 | HR 62 | Wt 277.0 lb

## 2015-04-17 DIAGNOSIS — E291 Testicular hypofunction: Secondary | ICD-10-CM

## 2015-04-17 MED ORDER — TESTOSTERONE CYPIONATE 200 MG/ML IM SOLN
200.0000 mg | Freq: Once | INTRAMUSCULAR | Status: AC
  Administered 2015-04-17: 200 mg via INTRAMUSCULAR

## 2015-04-17 NOTE — Progress Notes (Signed)
   Subjective:    Patient ID: Richard Beltran, male    DOB: 1966-02-07, 49 y.o.   MRN: [id number]  HPI  Patient came into office today for testosterone injection. Denies chest pain, shortness of breath, headaches and problems associated with taking this medication. Patient states he has had no abnornal mood swings.   Review of Systems     Objective:   Physical Exam        Assessment & Plan:  Patient tolerated injection in [place] well without complications. Patient advised to schedule his next injection for 2 weeks from today.

## 2015-04-22 ENCOUNTER — Other Ambulatory Visit: Payer: Self-pay | Admitting: Sports Medicine

## 2015-04-29 ENCOUNTER — Other Ambulatory Visit: Payer: Self-pay | Admitting: Sports Medicine

## 2015-05-01 ENCOUNTER — Ambulatory Visit (INDEPENDENT_AMBULATORY_CARE_PROVIDER_SITE_OTHER): Payer: 59 | Admitting: Sports Medicine

## 2015-05-01 VITALS — BP 126/82 | HR 75 | Wt 278.0 lb

## 2015-05-01 DIAGNOSIS — E291 Testicular hypofunction: Secondary | ICD-10-CM

## 2015-05-01 MED ORDER — TESTOSTERONE CYPIONATE 200 MG/ML IM SOLN
200.0000 mg | Freq: Once | INTRAMUSCULAR | Status: AC
  Administered 2015-05-01: 200 mg via INTRAMUSCULAR

## 2015-05-01 NOTE — Progress Notes (Signed)
   Subjective:    Patient ID: Richard Beltran, male    DOB: March 10, 1966, 49 y.o.   MRN: [id number]  HPI  Patient came into office today for testosterone injection. Denies chest pain, shortness of breath, headaches and problems associated with taking this medication. Patient states he has had no abnornal mood swings. Pt does complain of a cough for the past week, advised we should set him up with PCP for evaluation  Review of Systems     Objective:   Physical Exam        Assessment & Plan:  Patient tolerated injection in [place] well without complications. Patient advised to schedule his next injection for 2 weeks from today and was set for an acute visit with PCP tomorrow. No further questions.

## 2015-05-02 ENCOUNTER — Ambulatory Visit (INDEPENDENT_AMBULATORY_CARE_PROVIDER_SITE_OTHER): Payer: 59 | Admitting: Sports Medicine

## 2015-05-02 ENCOUNTER — Encounter: Payer: Self-pay | Admitting: Sports Medicine

## 2015-05-02 VITALS — BP 139/88 | HR 76 | Temp 98.0°F | Resp 18 | Wt 279.0 lb

## 2015-05-02 DIAGNOSIS — J209 Acute bronchitis, unspecified: Secondary | ICD-10-CM | POA: Diagnosis not present

## 2015-05-02 DIAGNOSIS — B349 Viral infection, unspecified: Secondary | ICD-10-CM | POA: Insufficient documentation

## 2015-05-02 MED ORDER — BENZONATATE 200 MG PO CAPS: 200.0000 mg | ORAL_CAPSULE | Freq: Three times a day (TID) | ORAL | Status: DC | PRN

## 2015-05-02 MED ORDER — HYDROCOD POLST-CPM POLST ER 10-8 MG/5ML PO SUER: 5.0000 mL | Freq: Two times a day (BID) | ORAL | Status: DC | PRN

## 2015-05-02 NOTE — Progress Notes (Signed)
  Subjective:    CC: Coughing  HPI: This is a pleasant 49 year old male, for the past 3 days she's had a cough, minimally productive but no hemoptysis, no constitutional symptoms, mild muscle aches and body aches, symptoms are moderate, persistent.  Past medical history, Surgical history, Family history not pertinant except as noted below, Social history, Allergies, and medications have been entered into the medical record, reviewed, and no changes needed.   Review of Systems: No fevers, chills, night sweats, weight loss, chest pain, or shortness of breath.   Objective:    General: Well Developed, well nourished, and in no acute distress.  Neuro: Alert and oriented x3, extra-ocular muscles intact, sensation grossly intact.  HEENT: Normocephalic, atraumatic, pupils equal round reactive to light, neck supple, no masses, no lymphadenopathy, thyroid nonpalpable. Oropharynx, nasopharynx, ear canals are unremarkable to inspection. Skin: Warm and dry, no rashes. Cardiac: Regular rate and rhythm, no murmurs rubs or gallops, no lower extremity edema.  Respiratory: Clear to auscultation bilaterally. Not using accessory muscles, speaking in full sentences.  Impression and Recommendations:

## 2015-05-02 NOTE — Assessment & Plan Note (Signed)
This is likely viral bronchitis, Tessalon Perles, Tussionex. Normal lung exam. Return if no better in 2 weeks.

## 2015-05-02 NOTE — Patient Instructions (Signed)
Acute Bronchitis Bronchitis is inflammation of the airways that extend from the windpipe into the lungs (bronchi). The inflammation often causes mucus to develop. This leads to a cough, which is the most common symptom of bronchitis.  In acute bronchitis, the condition usually develops suddenly and goes away over time, usually in a couple weeks. Smoking, allergies, and asthma can make bronchitis worse. Repeated episodes of bronchitis may cause further lung problems.  CAUSES Acute bronchitis is most often caused by the same virus that causes a cold. The virus can spread from person to person (contagious) through coughing, sneezing, and touching contaminated objects. SIGNS AND SYMPTOMS   Cough.   Fever.   Coughing up mucus.   Body aches.   Chest congestion.   Chills.   Shortness of breath.   Sore throat.  DIAGNOSIS  Acute bronchitis is usually diagnosed through a physical exam. Your health care provider will also ask you questions about your medical history. Tests, such as chest X-rays, are sometimes done to rule out other conditions.  TREATMENT  Acute bronchitis usually goes away in a couple weeks. Oftentimes, no medical treatment is necessary. Medicines are sometimes given for relief of fever or cough. Antibiotic medicines are usually not needed but may be prescribed in certain situations. In some cases, an inhaler may be recommended to help reduce shortness of breath and control the cough. A cool mist vaporizer may also be used to help thin bronchial secretions and make it easier to clear the chest.  HOME CARE INSTRUCTIONS  Get plenty of rest.   Drink enough fluids to keep your urine clear or pale yellow (unless you have a medical condition that requires fluid restriction). Increasing fluids may help thin your respiratory secretions (sputum) and reduce chest congestion, and it will prevent dehydration.   Take medicines only as directed by your health care provider.  If  you were prescribed an antibiotic medicine, finish it all even if you start to feel better.  Avoid smoking and secondhand smoke. Exposure to cigarette smoke or irritating chemicals will make bronchitis worse. If you are a smoker, consider using nicotine gum or skin patches to help control withdrawal symptoms. Quitting smoking will help your lungs heal faster.   Reduce the chances of another bout of acute bronchitis by washing your hands frequently, avoiding people with cold symptoms, and trying not to touch your hands to your mouth, nose, or eyes.   Keep all follow-up visits as directed by your health care provider.  SEEK MEDICAL CARE IF: Your symptoms do not improve after 1 week of treatment.  SEEK IMMEDIATE MEDICAL CARE IF:  You develop an increased fever or chills.   You have chest pain.   You have severe shortness of breath.  You have bloody sputum.   You develop dehydration.  You faint or repeatedly feel like you are going to pass out.  You develop repeated vomiting.  You develop a severe headache. MAKE SURE YOU:   Understand these instructions.  Will watch your condition.  Will get help right away if you are not doing well or get worse.   This information is not intended to replace advice given to you by your health care provider. Make sure you discuss any questions you have with your health care provider.   Document Released: 05/27/2004 Document Revised: 05/10/2014 Document Reviewed: 10/10/2012 Elsevier Interactive Patient Education 2016 [employer]  

## 2015-05-09 ENCOUNTER — Other Ambulatory Visit: Payer: Self-pay | Admitting: Sports Medicine

## 2015-05-15 ENCOUNTER — Ambulatory Visit (INDEPENDENT_AMBULATORY_CARE_PROVIDER_SITE_OTHER): Payer: No Typology Code available for payment source | Admitting: Sports Medicine

## 2015-05-15 VITALS — BP 130/82 | HR 67 | Wt 277.1 lb

## 2015-05-15 DIAGNOSIS — E291 Testicular hypofunction: Secondary | ICD-10-CM | POA: Diagnosis not present

## 2015-05-15 MED ORDER — TESTOSTERONE CYPIONATE 200 MG/ML IM SOLN: 200.0000 mg | INTRAMUSCULAR | Status: DC

## 2015-05-15 NOTE — Progress Notes (Signed)
   Subjective:    Patient ID: Richard Beltran, male    DOB: December 19, 1965, 50 y.o.   MRN: [id number]  HPI  Pt is here for testosterone injection.  Denies chest pain, SOB, headaches, and mood changes.  Denies problems with medication.    Review of Systems     Objective:   Physical Exam        Assessment & Plan:  Pt tolerated injection well without complications.  Injection in [place].  Pt advised to schedule appointment for next injection in 14 days.

## 2015-05-29 ENCOUNTER — Other Ambulatory Visit: Payer: Self-pay | Admitting: Sports Medicine

## 2015-05-29 ENCOUNTER — Ambulatory Visit (INDEPENDENT_AMBULATORY_CARE_PROVIDER_SITE_OTHER): Payer: Self-pay | Admitting: Sports Medicine

## 2015-05-29 VITALS — BP 118/77 | HR 70 | Ht 73.0 in | Wt 275.0 lb

## 2015-05-29 DIAGNOSIS — E291 Testicular hypofunction: Secondary | ICD-10-CM

## 2015-05-29 MED ORDER — TESTOSTERONE CYPIONATE 200 MG/ML IM SOLN
200.0000 mg | INTRAMUSCULAR | Status: DC
  Administered 2015-05-29: 200 mg via INTRAMUSCULAR

## 2015-05-29 MED ORDER — MELOXICAM 15 MG PO TABS: ORAL_TABLET | ORAL | Status: DC

## 2015-05-29 NOTE — Progress Notes (Signed)
Patient was in office for Testosterone injection. 200 ml of Depo Testosterone was given LUOQ. Patient denied any headaches, dizziness, shortness of breath. Patient did request refill for Tramadol and Meloxicam. melocxicam was refilled but Tramadol was too early. Rhonda Cunningham,CMA

## 2015-06-12 ENCOUNTER — Ambulatory Visit (INDEPENDENT_AMBULATORY_CARE_PROVIDER_SITE_OTHER): Payer: BLUE CROSS/BLUE SHIELD | Admitting: Sports Medicine

## 2015-06-12 ENCOUNTER — Other Ambulatory Visit: Payer: Self-pay | Admitting: Emergency Medicine

## 2015-06-12 VITALS — BP 158/90 | HR 87 | Resp 16 | Wt 275.0 lb

## 2015-06-12 DIAGNOSIS — E291 Testicular hypofunction: Secondary | ICD-10-CM | POA: Diagnosis not present

## 2015-06-12 MED ORDER — LISINOPRIL-HYDROCHLOROTHIAZIDE 20-25 MG PO TABS: 1.0000 | ORAL_TABLET | Freq: Every day | ORAL | Status: DC

## 2015-06-12 MED ORDER — AMLODIPINE BESYLATE 10 MG PO TABS: 10.0000 mg | ORAL_TABLET | Freq: Every day | ORAL | Status: DC

## 2015-06-12 MED ORDER — TESTOSTERONE CYPIONATE 200 MG/ML IM SOLN
300.0000 mg | INTRAMUSCULAR | Status: DC
  Administered 2015-06-12: 300 mg via INTRAMUSCULAR

## 2015-06-12 NOTE — Progress Notes (Signed)
   Subjective:    Patient ID: Richard Beltran, male    DOB: 11/11/1965, 50 y.o.   MRN: [id number]  HPI Patient here for testosterone injection. Patient denies problems with shortness of breath, chest pain, mood changes or other medication problems. He did run out of antihypertensive meds yesterday and is requesting refills.   Review of Systems     Objective:   Physical Exam        Assessment & Plan:  Patient tolerated injection well without complications. Patient advised to schedule next injection in 14 days. Will send refills on lisinopril and amlodipine.

## 2015-06-26 ENCOUNTER — Ambulatory Visit (INDEPENDENT_AMBULATORY_CARE_PROVIDER_SITE_OTHER): Payer: BLUE CROSS/BLUE SHIELD | Admitting: Sports Medicine

## 2015-06-26 VITALS — BP 120/82 | HR 68 | Wt 281.0 lb

## 2015-06-26 DIAGNOSIS — E291 Testicular hypofunction: Secondary | ICD-10-CM

## 2015-06-26 MED ORDER — TRAMADOL HCL 50 MG PO TABS: 50.0000 mg | ORAL_TABLET | Freq: Three times a day (TID) | ORAL | Status: DC | PRN

## 2015-06-26 MED ORDER — TESTOSTERONE CYPIONATE 200 MG/ML IM SOLN
300.0000 mg | Freq: Once | INTRAMUSCULAR | Status: AC
  Administered 2015-06-26: 300 mg via INTRAMUSCULAR

## 2015-06-26 NOTE — Progress Notes (Signed)
   Subjective:    Patient ID: Richard Beltran, male    DOB: 08/02/1965, 49 y.o.   MRN: [id number]  HPI  Richard Beltran is here for a testosterone injection. Denies chest pain, shortness of breath, headaches or mood changes.   Review of Systems     Objective:   Physical Exam        Assessment & Plan:  Patient tolerated injection well without complications. Patient advised to schedule next injection 14 days from today.  

## 2015-07-02 ENCOUNTER — Ambulatory Visit (INDEPENDENT_AMBULATORY_CARE_PROVIDER_SITE_OTHER): Payer: BLUE CROSS/BLUE SHIELD | Admitting: Family Medicine

## 2015-07-02 ENCOUNTER — Encounter: Payer: Self-pay | Admitting: Family Medicine

## 2015-07-02 ENCOUNTER — Ambulatory Visit (INDEPENDENT_AMBULATORY_CARE_PROVIDER_SITE_OTHER): Payer: BLUE CROSS/BLUE SHIELD

## 2015-07-02 VITALS — BP 128/80 | HR 65 | Temp 98.3°F | Wt 279.0 lb

## 2015-07-02 DIAGNOSIS — R52 Pain, unspecified: Secondary | ICD-10-CM

## 2015-07-02 DIAGNOSIS — R059 Cough, unspecified: Secondary | ICD-10-CM

## 2015-07-02 DIAGNOSIS — R05 Cough: Secondary | ICD-10-CM

## 2015-07-02 DIAGNOSIS — J209 Acute bronchitis, unspecified: Secondary | ICD-10-CM

## 2015-07-02 MED ORDER — KETOROLAC TROMETHAMINE 60 MG/2ML IM SOLN
60.0000 mg | Freq: Once | INTRAMUSCULAR | Status: AC
  Administered 2015-07-02: 60 mg via INTRAMUSCULAR

## 2015-07-02 MED ORDER — PREDNISONE 10 MG PO TABS: 30.0000 mg | ORAL_TABLET | Freq: Every day | ORAL | Status: DC

## 2015-07-02 MED ORDER — METHYLPREDNISOLONE ACETATE 80 MG/ML IJ SUSP
80.0000 mg | Freq: Once | INTRAMUSCULAR | Status: AC
  Administered 2015-07-02: 80 mg via INTRAMUSCULAR

## 2015-07-02 MED ORDER — AZITHROMYCIN 250 MG PO TABS: 250.0000 mg | ORAL_TABLET | Freq: Every day | ORAL | Status: DC

## 2015-07-02 MED ORDER — IPRATROPIUM-ALBUTEROL 0.5-2.5 (3) MG/3ML IN SOLN
3.0000 mL | Freq: Four times a day (QID) | RESPIRATORY_TRACT | Status: DC
  Administered 2015-07-02: 3 mL via RESPIRATORY_TRACT

## 2015-07-02 MED ORDER — HYDROCODONE-ACETAMINOPHEN 5-325 MG PO TABS: 1.0000 | ORAL_TABLET | Freq: Four times a day (QID) | ORAL | Status: DC | PRN

## 2015-07-02 NOTE — Progress Notes (Signed)
Richard Beltran is a 50 y.o. male who presents to [facility]: [facility] today for cough congestion subjective fevers and chills. Patient also notes significant back pain. Symptoms present for about 2 days. He's tried NyQuil and Aleve last night which helped a bit. He denies any chest pain vomiting or diarrhea. He denies any severe shortness of breath. He notes some mild subjective wheezing but denies a history of asthma.   Past Medical History  Diagnosis Date  . Hypertension   . Male hypogonadism   . Lumbar degenerative disc disease 10/29/2013    Post L4-L5 fusion.    Past Surgical History  Procedure Laterality Date  . Back surgery     Social History  Substance Use Topics  . Smoking status: Never Smoker   . Smokeless tobacco: Never Used  . Alcohol Use: No   family history includes Diabetes in his mother; Healthy in his father, maternal grandfather, maternal grandmother, paternal grandfather, and paternal grandmother; Hyperlipidemia in his mother.  ROS as above Medications: Current Outpatient Prescriptions  Medication Sig Dispense Refill  . AMBULATORY NON FORMULARY MEDICATION CPAP with humidifier,mask, supplies Set to auto-titrate with range of 4-20 cm water pressure.  DX G47.33 Obstructive Sleep Apnea Please do download after 10 days on CPAP 1 each 0  . amLODipine (NORVASC) 10 MG tablet Take 1 tablet (10 mg total) by mouth daily. 30 tablet 2  . carvedilol (COREG) 25 MG tablet TAKE 1 TABLET (25 MG TOTAL) BY MOUTH 2 (TWO) TIMES DAILY WITH A MEAL. 180 tablet 2  . lisinopril-hydrochlorothiazide (PRINZIDE,ZESTORETIC) 20-25 MG tablet Take 1 tablet by mouth daily. 30 tablet 2  . Lorcaserin HCl 10 MG TABS Take 10 mg by mouth 2 (two) times daily. 60 tablet 0  . meloxicam (MOBIC) 15 MG tablet TAKE 1 TABLET BY MOUTH EACH MORNING WITH BREAKFAST FOR 2 WEEKS THEN DAILY AS NEEDED 90 tablet 1  .  testosterone cypionate (DEPOTESTOTERONE CYPIONATE) 100 MG/ML injection Inject 300 mg into the muscle every 14 (fourteen) days. For IM use only    . traMADol (ULTRAM) 50 MG tablet Take 1-2 tablets (50-100 mg total) by mouth every 8 (eight) hours as needed for moderate pain. 90 tablet 0  . azithromycin (ZITHROMAX) 250 MG tablet Take 1 tablet (250 mg total) by mouth daily. Take first 2 tablets together, then 1 every day until finished. 6 tablet 0  . HYDROcodone-acetaminophen (NORCO/VICODIN) 5-325 MG tablet Take 1 tablet by mouth every 6 (six) hours as needed. 15 tablet 0  . predniSONE (DELTASONE) 10 MG tablet Take 3 tablets (30 mg total) by mouth daily with breakfast. 15 tablet 0   Current Facility-Administered Medications  Medication Dose Route Frequency Provider Last Rate Last Dose  . ipratropium-albuterol (DUONEB) 0.5-2.5 (3) MG/3ML nebulizer solution 3 mL  3 mL Nebulization Q6H Gregor Hams, MD   3 mL at 07/02/15 1440   No Known Allergies   Exam:  BP 128/80 mmHg  Pulse 65  Temp(Src) 98.3 F (36.8 C) (Oral)  Wt 279 lb (126.554 kg) Gen: Well NAD nontoxic appearing HEENT: EOMI,  MMM clear nasal discharge present. Normal tympanic membranes bilaterally. Lungs: Normal work of breathing. Mild coarse breath sounds bilaterally without any focal rails Heart: RRR no MRG Abd: NABS, Soft. Nondistended, Nontender Exts: Brisk capillary refill, warm and well perfused.   Patient was given a 2.5/0.5 mg DuoNeb nebulizer treatment and felt a bit better. He additionally was given 60 mg IM Toradol,  and 80 mg IM Depo-Medrol prior to discharge. Point-of-care influenza test was negative.  No results found for this or any previous visit (from the past 24 hour(s)). No results found.   Please see individual assessment and plan sections.

## 2015-07-02 NOTE — Patient Instructions (Signed)
Thank you for coming in today. Take prednisone starting tomorrow. Use azithromycin if not improving. X-ray today. Use Norco for pain and for cough as needed. Return if not better or if worsening. Call or go to the emergency room if you get worse, have trouble breathing, have chest pains, or palpitations.   Acute Bronchitis Bronchitis is inflammation of the airways that extend from the windpipe into the lungs (bronchi). The inflammation often causes mucus to develop. This leads to a cough, which is the most common symptom of bronchitis.  In acute bronchitis, the condition usually develops suddenly and goes away over time, usually in a couple weeks. Smoking, allergies, and asthma can make bronchitis worse. Repeated episodes of bronchitis may cause further lung problems.  CAUSES Acute bronchitis is most often caused by the same virus that causes a cold. The virus can spread from person to person (contagious) through coughing, sneezing, and touching contaminated objects. SIGNS AND SYMPTOMS   Cough.   Fever.   Coughing up mucus.   Body aches.   Chest congestion.   Chills.   Shortness of breath.   Sore throat.  DIAGNOSIS  Acute bronchitis is usually diagnosed through a physical exam. Your health care provider will also ask you questions about your medical history. Tests, such as chest X-rays, are sometimes done to rule out other conditions.  TREATMENT  Acute bronchitis usually goes away in a couple weeks. Oftentimes, no medical treatment is necessary. Medicines are sometimes given for relief of fever or cough. Antibiotic medicines are usually not needed but may be prescribed in certain situations. In some cases, an inhaler may be recommended to help reduce shortness of breath and control the cough. A cool mist vaporizer may also be used to help thin bronchial secretions and make it easier to clear the chest.  HOME CARE INSTRUCTIONS  Get plenty of rest.   Drink enough fluids  to keep your urine clear or pale yellow (unless you have a medical condition that requires fluid restriction). Increasing fluids may help thin your respiratory secretions (sputum) and reduce chest congestion, and it will prevent dehydration.   Take medicines only as directed by your health care provider.  If you were prescribed an antibiotic medicine, finish it all even if you start to feel better.  Avoid smoking and secondhand smoke. Exposure to cigarette smoke or irritating chemicals will make bronchitis worse. If you are a smoker, consider using nicotine gum or skin patches to help control withdrawal symptoms. Quitting smoking will help your lungs heal faster.   Reduce the chances of another bout of acute bronchitis by washing your hands frequently, avoiding people with cold symptoms, and trying not to touch your hands to your mouth, nose, or eyes.   Keep all follow-up visits as directed by your health care provider.  SEEK MEDICAL CARE IF: Your symptoms do not improve after 1 week of treatment.  SEEK IMMEDIATE MEDICAL CARE IF:  You develop an increased fever or chills.   You have chest pain.   You have severe shortness of breath.  You have bloody sputum.   You develop dehydration.  You faint or repeatedly feel like you are going to pass out.  You develop repeated vomiting.  You develop a severe headache. MAKE SURE YOU:   Understand these instructions.  Will watch your condition.  Will get help right away if you are not doing well or get worse.   This information is not intended to replace advice given to you by  your health care provider. Make sure you discuss any questions you have with your health care provider.   Document Released: 05/27/2004 Document Revised: 05/10/2014 Document Reviewed: 10/10/2012 Elsevier Interactive Patient Education [employer].

## 2015-07-02 NOTE — Assessment & Plan Note (Signed)
Symptoms consistent with bronchitis. X-ray pending. Treatment with Toradol and Depo-Medrol as noted above. Norco for pain and cough suppression. Prednisone and azithromycin as well. Chest x-ray pending. Return sooner if needed.

## 2015-07-03 NOTE — Progress Notes (Signed)
Quick Note:  No pneumonia on xray. ______

## 2015-07-10 ENCOUNTER — Ambulatory Visit (INDEPENDENT_AMBULATORY_CARE_PROVIDER_SITE_OTHER): Payer: BLUE CROSS/BLUE SHIELD | Admitting: Sports Medicine

## 2015-07-10 VITALS — BP 139/74 | HR 60

## 2015-07-10 DIAGNOSIS — E291 Testicular hypofunction: Secondary | ICD-10-CM | POA: Diagnosis not present

## 2015-07-10 MED ORDER — TESTOSTERONE CYPIONATE 200 MG/ML IM SOLN
300.0000 mg | Freq: Once | INTRAMUSCULAR | Status: AC
  Administered 2015-07-10: 300 mg via INTRAMUSCULAR

## 2015-07-10 NOTE — Progress Notes (Signed)
   Subjective:    Patient ID: Richard Beltran, male    DOB: 02-23-1966, 50 y.o.   MRN: [id number] Pt in for testosterone injection.  Given LUOQ without complications. Beatris Ship, CMA HPI    Review of Systems     Objective:   Physical Exam        Assessment & Plan:

## 2015-07-22 ENCOUNTER — Ambulatory Visit (INDEPENDENT_AMBULATORY_CARE_PROVIDER_SITE_OTHER): Payer: BLUE CROSS/BLUE SHIELD | Admitting: Family Medicine

## 2015-07-22 ENCOUNTER — Ambulatory Visit (INDEPENDENT_AMBULATORY_CARE_PROVIDER_SITE_OTHER): Payer: BLUE CROSS/BLUE SHIELD

## 2015-07-22 ENCOUNTER — Encounter: Payer: Self-pay | Admitting: Family Medicine

## 2015-07-22 VITALS — BP 153/92 | HR 87 | Temp 98.4°F | Wt 277.0 lb

## 2015-07-22 DIAGNOSIS — E291 Testicular hypofunction: Secondary | ICD-10-CM

## 2015-07-22 DIAGNOSIS — M5136 Other intervertebral disc degeneration, lumbar region: Secondary | ICD-10-CM | POA: Diagnosis not present

## 2015-07-22 DIAGNOSIS — R05 Cough: Secondary | ICD-10-CM

## 2015-07-22 DIAGNOSIS — J209 Acute bronchitis, unspecified: Secondary | ICD-10-CM

## 2015-07-22 MED ORDER — TESTOSTERONE CYPIONATE 200 MG/ML IM SOLN
300.0000 mg | Freq: Once | INTRAMUSCULAR | Status: AC
  Administered 2015-07-22: 300 mg via INTRAMUSCULAR

## 2015-07-22 MED ORDER — HYDROCODONE-ACETAMINOPHEN 5-325 MG PO TABS: 1.0000 | ORAL_TABLET | Freq: Four times a day (QID) | ORAL | Status: DC | PRN

## 2015-07-22 MED ORDER — ALBUTEROL SULFATE HFA 108 (90 BASE) MCG/ACT IN AERS: 2.0000 | INHALATION_SPRAY | Freq: Four times a day (QID) | RESPIRATORY_TRACT | Status: DC | PRN

## 2015-07-22 MED ORDER — CEFDINIR 300 MG PO CAPS: 300.0000 mg | ORAL_CAPSULE | Freq: Two times a day (BID) | ORAL | Status: DC

## 2015-07-22 NOTE — Assessment & Plan Note (Signed)
Back pain exacerbation following coughing spell. Refer to physical therapy.

## 2015-07-22 NOTE — Progress Notes (Signed)
Richard Beltran is a 50 y.o. male who presents to [facility] today for cough congestion. Patient was seen March 1 for what was thought to be bronchitis. He was treated with prednisone and azithromycin and some hydrocodone-based cough medicines. He did better than worsen again recently after a recent trip. He denies severe shortness of breath or chest pain or leg swelling. He does note that his symptoms have worsened and he feels fatigued. The cough is quite bothersome at night. He has he's having trouble using his CPAP machine at night because of his recent illness.   Additionally patient notes significant lower and mid back pain. He notes this is worsening recently with coughing. He denies any radiating pain weakness or numbness.   Past Medical History  Diagnosis Date  . Hypertension   . Male hypogonadism   . Lumbar degenerative disc disease 10/29/2013    Post L4-L5 fusion.    Past Surgical History  Procedure Laterality Date  . Back surgery     Social History  Substance Use Topics  . Smoking status: Never Smoker   . Smokeless tobacco: Never Used  . Alcohol Use: No   family history includes Diabetes in his mother; Healthy in his father, maternal grandfather, maternal grandmother, paternal grandfather, and paternal grandmother; Hyperlipidemia in his mother.  ROS as above Medications: Current Outpatient Prescriptions  Medication Sig Dispense Refill  . AMBULATORY NON FORMULARY MEDICATION CPAP with humidifier,mask, supplies Set to auto-titrate with range of 4-20 cm water pressure.  DX G47.33 Obstructive Sleep Apnea Please do download after 10 days on CPAP 1 each 0  . amLODipine (NORVASC) 10 MG tablet Take 1 tablet (10 mg total) by mouth daily. 30 tablet 2  . carvedilol (COREG) 25 MG tablet TAKE 1 TABLET (25 MG TOTAL) BY MOUTH 2 (TWO) TIMES DAILY WITH A MEAL. 180 tablet 2  .  HYDROcodone-acetaminophen (NORCO/VICODIN) 5-325 MG tablet Take 1 tablet by mouth every 6 (six) hours as needed. 15 tablet 0  . lisinopril-hydrochlorothiazide (PRINZIDE,ZESTORETIC) 20-25 MG tablet Take 1 tablet by mouth daily. 30 tablet 2  . Lorcaserin HCl 10 MG TABS Take 10 mg by mouth 2 (two) times daily. 60 tablet 0  . meloxicam (MOBIC) 15 MG tablet TAKE 1 TABLET BY MOUTH EACH MORNING WITH BREAKFAST FOR 2 WEEKS THEN DAILY AS NEEDED 90 tablet 1  . testosterone cypionate (DEPOTESTOTERONE CYPIONATE) 100 MG/ML injection Inject 300 mg into the muscle every 14 (fourteen) days. For IM use only    . traMADol (ULTRAM) 50 MG tablet Take 1-2 tablets (50-100 mg total) by mouth every 8 (eight) hours as needed for moderate pain. 90 tablet 0  . albuterol (PROVENTIL HFA;VENTOLIN HFA) 108 (90 Base) MCG/ACT inhaler Inhale 2 puffs into the lungs every 6 (six) hours as needed for wheezing or shortness of breath. 1 Inhaler 0  . cefdinir (OMNICEF) 300 MG capsule Take 1 capsule (300 mg total) by mouth 2 (two) times daily. 14 capsule 0   Current Facility-Administered Medications  Medication Dose Route Frequency Provider Last Rate Last Dose  . testosterone cypionate (DEPOTESTOSTERONE CYPIONATE) injection 300 mg  300 mg Intramuscular Once Gregor Hams, MD       No Known Allergies   Exam:  BP 153/92 mmHg  Pulse 87  Temp(Src) 98.4 F (36.9 C) (Oral)  Wt 277 lb (125.646 kg)  SpO2 97% Gen: Well NAD Morbidly obese HEENT: EOMI,  MMM Lungs: Normal work of breathing. CTABL Heart:  RRR no MRG Abd: NABS, Soft. Nondistended, Nontender Exts: Brisk capillary refill, warm and well perfused. Trace edema bilateral lower extremities. Calf diameters appear to be equal Back: Tender to palpation bilateral lumbar and thoracic paraspinal muscles. Normal gait.  No results found for this or any previous visit (from the past 24 hour(s)). No results found.   Please see individual assessment and plan sections.

## 2015-07-22 NOTE — Patient Instructions (Addendum)
Thank you for coming in today. Get labs and xray today.  Start PT for your back.  Follow up with Dr T in 1 week.  Take omnicef antibiotics. Use albuterol inhaler as needed.  Return sooner if needed.  Call or go to the emergency room if you get worse, have trouble breathing, have chest pains, or palpitations.   Community-Acquired Pneumonia, Adult Pneumonia is an infection of the lungs. There are different types of pneumonia. One type can develop while a person is in a hospital. A different type, called community-acquired pneumonia, develops in people who are not, or have not recently been, in the hospital or other health care facility.  CAUSES Pneumonia may be caused by bacteria, viruses, or funguses. Community-acquired pneumonia is often caused by Streptococcus pneumonia bacteria. These bacteria are often passed from one person to another by breathing in droplets from the cough or sneeze of an infected person. RISK FACTORS The condition is more likely to develop in:  People who havechronic diseases, such as chronic obstructive pulmonary disease (COPD), asthma, congestive heart failure, cystic fibrosis, diabetes, or kidney disease.  People who haveearly-stage or late-stage HIV.  People who havesickle cell disease.  People who havehad their spleen removed (splenectomy).  People who havepoor Human resources officer.  People who havemedical conditions that increase the risk of breathing in (aspirating) secretions their own mouth and nose.   People who havea weakened immune system (immunocompromised).  People who smoke.  People whotravel to areas where pneumonia-causing germs commonly exist.  People whoare around animal habitats or animals that have pneumonia-causing germs, including birds, bats, rabbits, cats, and farm animals. SYMPTOMS Symptoms of this condition include:  Adry cough.  A wet (productive) cough.  Fever.  Sweating.  Chest pain, especially when breathing  deeply or coughing.  Rapid breathing or difficulty breathing.  Shortness of breath.  Shaking chills.  Fatigue.  Muscle aches. DIAGNOSIS Your health care provider will take a medical history and perform a physical exam. You may also have other tests, including:  Imaging studies of your chest, including X-rays.  Tests to check your blood oxygen level and other blood gases.  Other tests on blood, mucus (sputum), fluid around your lungs (pleural fluid), and urine. If your pneumonia is severe, other tests may be done to identify the specific cause of your illness. TREATMENT The type of treatment that you receive depends on many factors, such as the cause of your pneumonia, the medicines you take, and other medical conditions that you have. For most adults, treatment and recovery from pneumonia may occur at home. In some cases, treatment must happen in a hospital. Treatment may include:  Antibiotic medicines, if the pneumonia was caused by bacteria.  Antiviral medicines, if the pneumonia was caused by a virus.  Medicines that are given by mouth or through an IV tube.  Oxygen.  Respiratory therapy. Although rare, treating severe pneumonia may include:  Mechanical ventilation. This is done if you are not breathing well on your own and you cannot maintain a safe blood oxygen level.  Thoracentesis. This procedureremoves fluid around one lung or both lungs to help you breathe better. HOME CARE INSTRUCTIONS  Take over-the-counter and prescription medicines only as told by your health care provider.  Only takecough medicine if you are losing sleep. Understand that cough medicine can prevent your body's natural ability to remove mucus from your lungs.  If you were prescribed an antibiotic medicine, take it as told by your health care provider. Do not stop  taking the antibiotic even if you start to feel better.  Sleep in a semi-upright position at night. Try sleeping in a reclining  chair, or place a few pillows under your head.  Do not use tobacco products, including cigarettes, chewing tobacco, and e-cigarettes. If you need help quitting, ask your health care provider.  Drink enough water to keep your urine clear or pale yellow. This will help to thin out mucus secretions in your lungs. PREVENTION There are ways that you can decrease your risk of developing community-acquired pneumonia. Consider getting a pneumococcal vaccine if:  You are older than 50 years of age.  You are older than 50 years of age and are undergoing cancer treatment, have chronic lung disease, or have other medical conditions that affect your immune system. Ask your health care provider if this applies to you. There are different types and schedules of pneumococcal vaccines. Ask your health care provider which vaccination option is best for you. You may also prevent community-acquired pneumonia if you take these actions:  Get an influenza vaccine every year. Ask your health care provider which type of influenza vaccine is best for you.  Go to the dentist on a regular basis.  Wash your hands often. Use hand sanitizer if soap and water are not available. SEEK MEDICAL CARE IF:  You have a fever.  You are losing sleep because you cannot control your cough with cough medicine. SEEK IMMEDIATE MEDICAL CARE IF:  You have worsening shortness of breath.  You have increased chest pain.  Your sickness becomes worse, especially if you are an older adult or have a weakened immune system.  You cough up blood.   This information is not intended to replace advice given to you by your health care provider. Make sure you discuss any questions you have with your health care provider.   Document Released: 04/19/2005 Document Revised: 01/08/2015 Document Reviewed: 08/14/2014 Elsevier Interactive Patient Education [employer].

## 2015-07-22 NOTE — Assessment & Plan Note (Signed)
Repeat x-ray. Obtain CBC CMP and BNP. Additionally treat empirically with albuterol hydrocodone cough medicine and Omnicef. Follow-up in one week.

## 2015-07-23 LAB — CBC
HCT: 43.2 % (ref 39.0–52.0)
Hemoglobin: 14.2 g/dL (ref 13.0–17.0)
MCH: 28.9 pg (ref 26.0–34.0)
MCHC: 32.9 g/dL (ref 30.0–36.0)
MCV: 88 fL (ref 78.0–100.0)
MPV: 9.5 fL (ref 8.6–12.4)
PLATELETS: 220 10*3/uL (ref 150–400)
RBC: 4.91 MIL/uL (ref 4.22–5.81)
RDW: 14 % (ref 11.5–15.5)
WBC: 7.3 10*3/uL (ref 4.0–10.5)

## 2015-07-23 LAB — COMPREHENSIVE METABOLIC PANEL
ALT: 18 U/L (ref 9–46)
AST: 16 U/L (ref 10–40)
Albumin: 4.1 g/dL (ref 3.6–5.1)
Alkaline Phosphatase: 47 U/L (ref 40–115)
BUN: 13 mg/dL (ref 7–25)
CHLORIDE: 100 mmol/L (ref 98–110)
CO2: 28 mmol/L (ref 20–31)
CREATININE: 1.26 mg/dL (ref 0.60–1.35)
Calcium: 8.7 mg/dL (ref 8.6–10.3)
GLUCOSE: 83 mg/dL (ref 65–99)
POTASSIUM: 3.5 mmol/L (ref 3.5–5.3)
SODIUM: 137 mmol/L (ref 135–146)
TOTAL PROTEIN: 6.7 g/dL (ref 6.1–8.1)
Total Bilirubin: 0.6 mg/dL (ref 0.2–1.2)

## 2015-07-23 LAB — BRAIN NATRIURETIC PEPTIDE: BRAIN NATRIURETIC PEPTIDE: 13.1 pg/mL (ref <100)

## 2015-07-24 ENCOUNTER — Ambulatory Visit: Payer: BLUE CROSS/BLUE SHIELD

## 2015-07-24 NOTE — Progress Notes (Signed)
Quick Note:  Labs look OK.  Follow up with PCP.  Use CPAP if able this will help with breathing. ______

## 2015-07-29 ENCOUNTER — Other Ambulatory Visit: Payer: Self-pay | Admitting: Sports Medicine

## 2015-07-29 ENCOUNTER — Ambulatory Visit (INDEPENDENT_AMBULATORY_CARE_PROVIDER_SITE_OTHER): Payer: BLUE CROSS/BLUE SHIELD | Admitting: Sports Medicine

## 2015-07-29 ENCOUNTER — Encounter: Payer: Self-pay | Admitting: Sports Medicine

## 2015-07-29 VITALS — BP 126/77 | HR 68 | Resp 18 | Wt 273.6 lb

## 2015-07-29 DIAGNOSIS — G4700 Insomnia, unspecified: Secondary | ICD-10-CM | POA: Diagnosis not present

## 2015-07-29 DIAGNOSIS — B349 Viral infection, unspecified: Secondary | ICD-10-CM | POA: Diagnosis not present

## 2015-07-29 LAB — CBC WITH DIFFERENTIAL/PLATELET
Basophils Absolute: 0 10*3/uL (ref 0.0–0.1)
Basophils Relative: 0 % (ref 0–1)
Eosinophils Absolute: 0.1 K/uL (ref 0.0–0.7)
Eosinophils Relative: 2 % (ref 0–5)
HCT: 43.9 % (ref 39.0–52.0)
Hemoglobin: 14.5 g/dL (ref 13.0–17.0)
Lymphocytes Relative: 33 % (ref 12–46)
Lymphs Abs: 2.1 K/uL (ref 0.7–4.0)
MCH: 29.4 pg (ref 26.0–34.0)
MCHC: 33 g/dL (ref 30.0–36.0)
MCV: 88.9 fL (ref 78.0–100.0)
MPV: 10 fL (ref 8.6–12.4)
Monocytes Absolute: 0.4 10*3/uL (ref 0.1–1.0)
Monocytes Relative: 6 % (ref 3–12)
Neutro Abs: 3.7 10*3/uL (ref 1.7–7.7)
Neutrophils Relative %: 59 % (ref 43–77)
Platelets: 209 K/uL (ref 150–400)
RBC: 4.94 MIL/uL (ref 4.22–5.81)
RDW: 14.1 % (ref 11.5–15.5)
WBC: 6.3 10*3/uL (ref 4.0–10.5)

## 2015-07-29 MED ORDER — PREDNISONE 50 MG PO TABS: ORAL_TABLET | ORAL | Status: DC

## 2015-07-29 MED ORDER — SUVOREXANT 10 MG PO TABS: 1.0000 | ORAL_TABLET | Freq: Every day | ORAL | Status: DC

## 2015-07-29 NOTE — Assessment & Plan Note (Signed)
Prolonged myalgias, fatigue, mild cough. Negative flu swab, negative chest x-ray twice. Has already been on antibiotics including azithromycin and Omnicef. We are going to check Epstein-Barr virus titers, I'm going to add prednisone, and check some other routine blood work.

## 2015-07-29 NOTE — Assessment & Plan Note (Signed)
Sleep hygiene sounds okay. Adding Belsomra

## 2015-07-29 NOTE — Progress Notes (Signed)
  Subjective:    CC: Cough  HPI: This pleasant 50 year old male has been treated twice thus far for acute bronchitis with Omnicef and azithromycin, unfortunately has a persistent cough, worse with deep breathing. Symptoms are moderate, persistent, the first of breath, mild low-grade fevers, moderate muscle aches and myalgias. Symptoms have now been present for over one month.  Past medical history, Surgical history, Family history not pertinant except as noted below, Social history, Allergies, and medications have been entered into the medical record, reviewed, and no changes needed.   Review of Systems: No fevers, chills, night sweats, weight loss, chest pain, or shortness of breath.   Objective:    General: Well Developed, well nourished, and in no acute distress.  Neuro: Alert and oriented x3, extra-ocular muscles intact, sensation grossly intact.  HEENT: Normocephalic, atraumatic, pupils equal round reactive to light, neck supple, no masses, no lymphadenopathy, thyroid nonpalpable. Oropharynx, nasopharynx, ear canals unremarkable. Skin: Warm and dry, no rashes. Cardiac: Regular rate and rhythm, no murmurs rubs or gallops, no lower extremity edema.  Respiratory: Clear to auscultation bilaterally. Not using accessory muscles, speaking in full sentences.  Impression and Recommendations:

## 2015-07-30 LAB — COMPREHENSIVE METABOLIC PANEL
ALT: 19 U/L (ref 9–46)
AST: 18 U/L (ref 10–40)
Albumin: 4.1 g/dL (ref 3.6–5.1)
Alkaline Phosphatase: 45 U/L (ref 40–115)
Calcium: 9.3 mg/dL (ref 8.6–10.3)
Total Bilirubin: 0.7 mg/dL (ref 0.2–1.2)

## 2015-07-30 LAB — COMPREHENSIVE METABOLIC PANEL WITH GFR
BUN: 15 mg/dL (ref 7–25)
CO2: 23 mmol/L (ref 20–31)
Chloride: 101 mmol/L (ref 98–110)
Creat: 1.38 mg/dL — ABNORMAL HIGH (ref 0.60–1.35)
Glucose, Bld: 87 mg/dL (ref 65–99)
Potassium: 3.7 mmol/L (ref 3.5–5.3)
Sodium: 140 mmol/L (ref 135–146)
Total Protein: 7 g/dL (ref 6.1–8.1)

## 2015-07-30 LAB — EPSTEIN-BARR VIRUS VCA, IGM: EBV VCA IgM: <10 U/mL (ref <36.0)

## 2015-07-30 LAB — CK: Total CK: 168 U/L (ref 7–232)

## 2015-07-30 LAB — EPSTEIN-BARR VIRUS VCA, IGG: EBV VCA IgG: >750 U/mL — ABNORMAL HIGH (ref <18.0)

## 2015-08-01 LAB — CMV IGM: CMV IgM: 9.63 [AU]/ml (ref <30.00)

## 2015-08-05 ENCOUNTER — Ambulatory Visit (INDEPENDENT_AMBULATORY_CARE_PROVIDER_SITE_OTHER): Payer: BLUE CROSS/BLUE SHIELD | Admitting: Sports Medicine

## 2015-08-05 VITALS — BP 138/87 | HR 78 | Wt 278.0 lb

## 2015-08-05 DIAGNOSIS — E291 Testicular hypofunction: Secondary | ICD-10-CM | POA: Diagnosis not present

## 2015-08-05 MED ORDER — TESTOSTERONE CYPIONATE 200 MG/ML IM SOLN
300.0000 mg | Freq: Once | INTRAMUSCULAR | Status: AC
  Administered 2015-08-05: 300 mg via INTRAMUSCULAR

## 2015-08-05 NOTE — Progress Notes (Signed)
Patient came into office today for testosterone injection. Denies chest pain, shortness of breath, headaches and problems associated with taking this medication. Patient states he has had no abnornal mood swings. Patient tolerated injection in LUOQ well without complications. Patient advised to schedule his next injection for 2 weeks from today. 

## 2015-08-09 ENCOUNTER — Other Ambulatory Visit: Payer: Self-pay | Admitting: Sports Medicine

## 2015-08-12 ENCOUNTER — Ambulatory Visit: Payer: BLUE CROSS/BLUE SHIELD | Admitting: Sports Medicine

## 2015-08-21 ENCOUNTER — Ambulatory Visit: Payer: BLUE CROSS/BLUE SHIELD

## 2015-08-21 ENCOUNTER — Ambulatory Visit (INDEPENDENT_AMBULATORY_CARE_PROVIDER_SITE_OTHER): Payer: BLUE CROSS/BLUE SHIELD | Admitting: Sports Medicine

## 2015-08-21 ENCOUNTER — Encounter: Payer: Self-pay | Admitting: Sports Medicine

## 2015-08-21 VITALS — BP 132/84 | HR 66 | Resp 18 | Wt 272.3 lb

## 2015-08-21 DIAGNOSIS — E291 Testicular hypofunction: Secondary | ICD-10-CM | POA: Diagnosis not present

## 2015-08-21 DIAGNOSIS — M5136 Other intervertebral disc degeneration, lumbar region: Secondary | ICD-10-CM | POA: Diagnosis not present

## 2015-08-21 DIAGNOSIS — M51369 Other intervertebral disc degeneration, lumbar region without mention of lumbar back pain or lower extremity pain: Secondary | ICD-10-CM

## 2015-08-21 MED ORDER — TRAMADOL HCL 50 MG PO TABS: ORAL_TABLET | ORAL | Status: DC

## 2015-08-21 MED ORDER — TESTOSTERONE CYPIONATE 200 MG/ML IM SOLN
200.0000 mg | Freq: Once | INTRAMUSCULAR | Status: AC
  Administered 2015-08-21: 200 mg via INTRAMUSCULAR

## 2015-08-21 NOTE — Progress Notes (Signed)
  Subjective:    CC: follow-up  HPI: Insomnia: Resolved with Belsomra, he doesn't need this anymore.  Muscle aches: Resolved  Right lumbar radiculopathy: Good response to axial pain with an L5-S1 epidural in the past, radicular pain was then improved with a selective S1 epidural, desires repeat interventional treatment.  Past medical history, Surgical history, Family history not pertinant except as noted below, Social history, Allergies, and medications have been entered into the medical record, reviewed, and no changes needed.   Review of Systems: No fevers, chills, night sweats, weight loss, chest pain, or shortness of breath.   Objective:    General: Well Developed, well nourished, and in no acute distress.  Neuro: Alert and oriented x3, extra-ocular muscles intact, sensation grossly intact.  HEENT: Normocephalic, atraumatic, pupils equal round reactive to light, neck supple, no masses, no lymphadenopathy, thyroid nonpalpable.  Skin: Warm and dry, no rashes. Cardiac: Regular rate and rhythm, no murmurs rubs or gallops, no lower extremity edema.  Respiratory: Clear to auscultation bilaterally. Not using accessory muscles, speaking in full sentences.  Impression and Recommendations:    I spent 25 minutes with this patient, greater than 50% was face-to-face time counseling regarding the above diagnoses

## 2015-08-21 NOTE — Assessment & Plan Note (Signed)
Good response to axial pain with an L5-S1 epidural, subsequent S1 selective nerve root block provided good relief of his radicular pain back in 2015. Repeating right S1 selective nerve root epidural

## 2015-08-22 ENCOUNTER — Telehealth: Payer: Self-pay | Admitting: Sports Medicine

## 2015-08-22 MED ORDER — PREDNISONE 50 MG PO TABS: 50.0000 mg | ORAL_TABLET | Freq: Every day | ORAL | Status: DC

## 2015-08-22 NOTE — Telephone Encounter (Signed)
Pt called to state he has not heard yet about scheduling the epidural. Pt was given their phone number to call himself and schedule. Pt requesting something for pain to help over the weekend. Will route for review.

## 2015-08-22 NOTE — Telephone Encounter (Signed)
Tramadol given yesterday

## 2015-08-22 NOTE — Telephone Encounter (Signed)
Spoke with Pt, states the Rx for Tramadol "is not working." Will route back to PCP for review.

## 2015-08-22 NOTE — Telephone Encounter (Signed)
Adding a burst of prednisone, patient will double tramadol to 100 mg 3 times a day, awaiting epidural.

## 2015-08-22 NOTE — Telephone Encounter (Signed)
Pt advised of new Rx and change in Tramadol instructions. Verbalized understanding. No further questions.

## 2015-08-27 ENCOUNTER — Ambulatory Visit
Admission: RE | Admit: 2015-08-27 | Discharge: 2015-08-27 | Disposition: A | Discharge status: Home / Self Care | Payer: BLUE CROSS/BLUE SHIELD | Source: Ambulatory Visit | Attending: Sports Medicine | Admitting: Sports Medicine

## 2015-08-27 MED ORDER — METHYLPREDNISOLONE ACETATE 40 MG/ML INJ SUSP (RADIOLOG
120.0000 mg | Freq: Once | INTRAMUSCULAR | Status: AC
  Administered 2015-08-27: 120 mg via EPIDURAL

## 2015-08-27 MED ORDER — IOPAMIDOL (ISOVUE-M 200) INJECTION 41%
1.0000 mL | Freq: Once | INTRAMUSCULAR | Status: AC
  Administered 2015-08-27: 1 mL via EPIDURAL

## 2015-08-27 NOTE — Discharge Instructions (Signed)

## 2015-09-04 ENCOUNTER — Ambulatory Visit (INDEPENDENT_AMBULATORY_CARE_PROVIDER_SITE_OTHER): Payer: BLUE CROSS/BLUE SHIELD | Admitting: Sports Medicine

## 2015-09-04 VITALS — BP 145/77 | HR 57 | Ht 73.0 in | Wt 265.0 lb

## 2015-09-04 DIAGNOSIS — E291 Testicular hypofunction: Secondary | ICD-10-CM

## 2015-09-04 MED ORDER — TESTOSTERONE CYPIONATE 200 MG/ML IM SOLN
200.0000 mg | Freq: Once | INTRAMUSCULAR | Status: AC
  Administered 2015-09-04: 200 mg via INTRAMUSCULAR

## 2015-09-04 NOTE — Progress Notes (Signed)
   Subjective:    Patient ID: Richard Beltran, male    DOB: November 21, 1965, 50 y.o.   MRN: [id number] Pt in for testosterone injection.  Given LUOQ without complications.  Pt advised to return in 2 weeks for next injection.  Beatris Ship, CMA HPI    Review of Systems     Objective:   Physical Exam        Assessment & Plan:

## 2015-09-11 ENCOUNTER — Other Ambulatory Visit: Payer: Self-pay | Admitting: Sports Medicine

## 2015-09-12 ENCOUNTER — Emergency Department (INDEPENDENT_AMBULATORY_CARE_PROVIDER_SITE_OTHER)
Admission: EM | Admit: 2015-09-12 | Discharge: 2015-09-12 | Disposition: A | Discharge status: Home / Self Care | Payer: BLUE CROSS/BLUE SHIELD | Source: Home / Self Care | Attending: Emergency Medicine | Admitting: Emergency Medicine

## 2015-09-12 ENCOUNTER — Encounter: Payer: Self-pay | Admitting: *Deleted

## 2015-09-12 DIAGNOSIS — L03211 Cellulitis of face: Secondary | ICD-10-CM

## 2015-09-12 MED ORDER — DOXYCYCLINE HYCLATE 100 MG PO CAPS: 100.0000 mg | ORAL_CAPSULE | Freq: Two times a day (BID) | ORAL | Status: DC

## 2015-09-12 MED ORDER — CEFTRIAXONE SODIUM 1 G IJ SOLR
1.0000 g | INTRAMUSCULAR | Status: AC
  Administered 2015-09-12: 1 g via INTRAMUSCULAR

## 2015-09-12 MED ORDER — IBUPROFEN 600 MG PO TABS
600.0000 mg | ORAL_TABLET | Freq: Once | ORAL | Status: AC
  Administered 2015-09-12: 600 mg via ORAL

## 2015-09-12 MED ORDER — KETOROLAC TROMETHAMINE 60 MG/2ML IM SOLN
60.0000 mg | Freq: Once | INTRAMUSCULAR | Status: AC
  Administered 2015-09-12: 60 mg via INTRAMUSCULAR

## 2015-09-12 MED ORDER — HYDROCODONE-ACETAMINOPHEN 5-325 MG PO TABS: 1.0000 | ORAL_TABLET | ORAL | Status: DC | PRN

## 2015-09-12 NOTE — ED Provider Notes (Signed)
CSN: [id number]     Arrival date & time 09/12/15  1326 History   First MD Initiated Contact with Patient 09/12/15 1401     Chief Complaint  Patient presents with  . Eye Problem    HPI Yesterday, had a pimple lateral to right orbit. He squeezed it, but nothing came out. Since then, progressively worsening sharp and dull pain and swelling right lateral orbit and right eyelid. No radiation. No drainage or bleeding. No problems with vision. No other ENT problems. Currently, he states the pain is severe, 10 out of 10 intensity. Associated symptoms: Denies fever or chills or nausea or vomiting or chest pain or headache or shortness of breath Past Medical History  Diagnosis Date  . Hypertension   . Male hypogonadism   . Lumbar degenerative disc disease 10/29/2013    Post L4-L5 fusion.    Past Surgical History  Procedure Laterality Date  . Back surgery     Family History  Problem Relation Age of Onset  . Diabetes Mother   . Hyperlipidemia Mother   . Healthy Father   . Healthy Maternal Grandmother   . Healthy Maternal Grandfather   . Healthy Paternal Grandmother   . Healthy Paternal Grandfather    Social History  Substance Use Topics  . Smoking status: Never Smoker   . Smokeless tobacco: Never Used  . Alcohol Use: No    Review of Systems  All other systems reviewed and are negative.   Allergies  Eggs or egg-derived products  Home Medications   Prior to Admission medications   Medication Sig Start Date End Date Taking? Authorizing Provider  albuterol (PROVENTIL HFA;VENTOLIN HFA) 108 (90 Base) MCG/ACT inhaler Inhale 2 puffs into the lungs every 6 (six) hours as needed for wheezing or shortness of breath. 07/22/15   Gregor Hams, MD  AMBULATORY NON FORMULARY MEDICATION CPAP with humidifier,mask, supplies Set to auto-titrate with range of 4-20 cm water pressure.  DX G47.33 Obstructive Sleep Apnea Please do download after 10 days on CPAP 01/27/15   Silverio Decamp, MD    amLODipine (NORVASC) 10 MG tablet Take 1 tablet (10 mg total) by mouth daily. 06/12/15   Silverio Decamp, MD  carvedilol (COREG) 25 MG tablet TAKE 1 TABLET (25 MG TOTAL) BY MOUTH 2 (TWO) TIMES DAILY WITH A MEAL. 12/03/14   Silverio Decamp, MD  doxycycline (VIBRAMYCIN) 100 MG capsule Take 1 capsule (100 mg total) by mouth 2 (two) times daily. For 10 days. (This is an antibiotic) 09/12/15   Jacqulyn Cane, MD  HYDROcodone-acetaminophen (NORCO/VICODIN) 5-325 MG tablet Take 1-2 tablets by mouth every 4 (four) hours as needed for severe pain. Take with food. 09/12/15   Jacqulyn Cane, MD  lisinopril-hydrochlorothiazide (PRINZIDE,ZESTORETIC) 20-25 MG tablet Take 1 tablet by mouth daily. 06/12/15   Silverio Decamp, MD  Lorcaserin HCl 10 MG TABS Take 10 mg by mouth 2 (two) times daily. Patient not taking: Reported on 07/29/2015 11/19/14   Golden Circle, FNP  meloxicam (MOBIC) 15 MG tablet TAKE 1 TABLET BY MOUTH EACH MORNING WITH BREAKFAST FOR 2 WEEKS THEN DAILY AS NEEDED 05/29/15   Silverio Decamp, MD  Suvorexant (BELSOMRA) 10 MG TABS Take 1 tablet by mouth at bedtime. 07/29/15   Silverio Decamp, MD  testosterone cypionate (DEPOTESTOTERONE CYPIONATE) 100 MG/ML injection Inject 300 mg into the muscle every 14 (fourteen) days. For IM use only    Historical Provider, MD  traMADol (ULTRAM) 50 MG tablet TAKE 1 TO 2  TABLETS BY MOUTH EVERY 8 HOURS AS NEEDED FOR PAIN 09/11/15   Silverio Decamp, MD   Meds Ordered and Administered this Visit   Medications  ibuprofen (ADVIL,MOTRIN) tablet 600 mg (600 mg Oral Given 09/12/15 1351)  cefTRIAXone (ROCEPHIN) injection 1 g (1 g Intramuscular Given 09/12/15 1412)  ketorolac (TORADOL) injection 60 mg (60 mg Intramuscular Given 09/12/15 1412)    BP 156/92 mmHg  Pulse 58  Temp(Src) 98.5 F (36.9 C) (Oral)  Resp 14  Wt 269 lb (122.018 kg)  SpO2 97% No data found.   Physical Exam  Constitutional: He is oriented to person, place, and time. He  appears well-developed and well-nourished. No distress.  No acute cardiorespiratory distress, but he is very uncomfortable from right facial pain  HENT:  Head: Atraumatic.    Right Ear: External ear normal.  Left Ear: External ear normal.  Nose: Nose normal. Right sinus exhibits no maxillary sinus tenderness and no frontal sinus tenderness. Left sinus exhibits no maxillary sinus tenderness and no frontal sinus tenderness.  Mouth/Throat: Oropharynx is clear and moist.  Eyes: Conjunctivae and EOM are normal. Pupils are equal, round, and reactive to light. Lids are everted and swept, no foreign bodies found. No scleral icterus.  Globe normal. Anterior chamber normal. Visual acuity grossly intact and equal bilaterally  Neck: Normal range of motion.  Cardiovascular: Normal rate.   Pulmonary/Chest: Effort normal.  Abdominal: He exhibits no distension.  Musculoskeletal: Normal range of motion.  Neurological: He is alert and oriented to person, place, and time.  Skin: Skin is warm. He is not diaphoretic.  Psychiatric: He has a normal mood and affect.  Nursing note and vitals reviewed.   ED Course  Procedures (including critical care time)  Labs Review Labs Reviewed - No data to display  Imaging Review No results found.   Visual Acuity Review  Right Eye Distance: 20/25 Left Eye Distance: 20/25 Bilateral Distance: 20/20 (with glasses)  Right Eye Near:   Left Eye Near:    Bilateral Near:       MDM   1. Facial cellulitis   Right side of the orbit and right eyelid. No evidence of periorbital cellulitis, but I explained to him red flags of this, and advised to go to emergency room if any red flags. Treatment options discussed, as well as risks, benefits, alternatives. Patient voiced understanding and agreement with the following plans:  For acute pain relief, Toradol 60 mg IM. When rechecked afterward, his pain significantly improved. Rocephin 1 g IM stat. Discharge  Medication List as of 09/12/2015  2:09 PM    START taking these medications   Details  doxycycline (VIBRAMYCIN) 100 MG capsule Take 1 capsule (100 mg total) by mouth 2 (two) times daily. For 10 days. (This is an antibiotic), Starting 09/12/2015, Until Discontinued, Print    HYDROcodone-acetaminophen (NORCO/VICODIN) 5-325 MG tablet Take 1-2 tablets by mouth every 4 (four) hours as needed for severe pain. Take with food., Starting 09/12/2015, Until Discontinued, Print       Follow-up with your primary care doctor in 5 days.ER if symptoms become worse or any red flag. Precautions discussed. Red flags discussed. An After Visit Summary was printed and given to the patient. Questions invited and answered. Patient voiced understanding and agreement. His wife was here to drive him home     Jacqulyn Cane, MD 09/12/15 1435

## 2015-09-12 NOTE — ED Notes (Signed)
Pt c/o right orbital swelling and tenderness that started yesterday and seems to be getting worse. No drainage.

## 2015-09-12 NOTE — Discharge Instructions (Signed)
Cellulitis Cellulitis is an infection of the skin and the tissue beneath it. The infected area is usually red and tender. You have cellulitis on your face.  CAUSES  Cellulitis is caused by bacteria that enter the skin through cracks or cuts in the skin, or from an infected pimple.  The most common types of bacteria that cause cellulitis are staphylococci and streptococci. SIGNS AND SYMPTOMS   Redness and warmth.  Swelling.  Tenderness or pain.  Fever. DIAGNOSIS  Your health care provider can usually determine what is wrong based on a physical exam. TREATMENT  Treatment usually involves taking an antibiotic medicine. Here in urgent care, we are giving you 2 shots: Rocephin, which is a strong antibiotic Toradol, which is a pain reliever HOME CARE INSTRUCTIONS   Take your oral antibiotic medicine as directed by your health care provider. Finish the antibiotic even if you start to feel better.  Do not squeeze the infected area  Apply a warm cloth to the affected area up to 4 times per day to relieve pain.  Take medicines only as directed by your health care provider.  Keep all follow-up visits as directed by your health care provider. SEEK MEDICAL CARE IF:   You notice red streaks coming from the infected area.  Your red area gets larger or turns dark in color.  Your bone or joint underneath the infected area becomes painful after the skin has healed.  Your infection returns in the same area or another area.  You notice a swollen bump in the infected area.  You develop new symptoms.  You have a fever. SEEK IMMEDIATE MEDICAL CARE IF:   You feel very sleepy.  You develop vomiting or diarrhea.  You have a general ill feeling (malaise) with muscle aches and pains.   This information is not intended to replace advice given to you by your health care provider. Make sure you discuss any questions you have with your health care provider.   Document Released: 01/27/2005  Document Revised: 01/08/2015 Document Reviewed: 07/05/2011 Elsevier Interactive Patient Education [employer].

## 2015-09-13 ENCOUNTER — Other Ambulatory Visit: Payer: Self-pay | Admitting: Sports Medicine

## 2015-09-15 ENCOUNTER — Telehealth: Payer: Self-pay | Admitting: *Deleted

## 2015-09-18 ENCOUNTER — Ambulatory Visit: Payer: BLUE CROSS/BLUE SHIELD

## 2015-09-22 ENCOUNTER — Ambulatory Visit: Payer: BLUE CROSS/BLUE SHIELD

## 2015-09-24 ENCOUNTER — Ambulatory Visit (INDEPENDENT_AMBULATORY_CARE_PROVIDER_SITE_OTHER): Payer: BLUE CROSS/BLUE SHIELD | Admitting: Sports Medicine

## 2015-09-24 VITALS — BP 134/85 | HR 64 | Wt 274.0 lb

## 2015-09-24 DIAGNOSIS — E291 Testicular hypofunction: Secondary | ICD-10-CM | POA: Diagnosis not present

## 2015-09-24 MED ORDER — TESTOSTERONE CYPIONATE 200 MG/ML IM SOLN
300.0000 mg | Freq: Once | INTRAMUSCULAR | Status: AC
  Administered 2015-09-24: 300 mg via INTRAMUSCULAR

## 2015-09-24 NOTE — Progress Notes (Signed)
Patient came into office today for testosterone injection. Denies chest pain, shortness of breath, headaches and problems associated with taking this medication. Patient states he has had no abnornal mood swings. Patient tolerated injection in ROUQ well without complications. Patient advised to schedule his next injection for 2 weeks from today. 

## 2015-10-09 ENCOUNTER — Ambulatory Visit (INDEPENDENT_AMBULATORY_CARE_PROVIDER_SITE_OTHER): Payer: BLUE CROSS/BLUE SHIELD | Admitting: Sports Medicine

## 2015-10-09 VITALS — BP 136/84 | HR 61 | Wt 273.0 lb

## 2015-10-09 DIAGNOSIS — E291 Testicular hypofunction: Secondary | ICD-10-CM

## 2015-10-09 MED ORDER — TESTOSTERONE CYPIONATE 200 MG/ML IM SOLN
300.0000 mg | Freq: Once | INTRAMUSCULAR | Status: AC
  Administered 2015-10-09: 300 mg via INTRAMUSCULAR

## 2015-10-09 NOTE — Patient Instructions (Signed)
Richard Beltran advised to make next appointment for his next testosterone injection in 14 days. -EMH/RMA

## 2015-10-09 NOTE — Progress Notes (Signed)
Richard Beltran presents to clinic for testosterone injection.  Pt denies any SOB, chest pain, headache, blurred vision. Pt tolerated injection well w/o complications. -EMH/RMA

## 2015-10-20 ENCOUNTER — Ambulatory Visit (INDEPENDENT_AMBULATORY_CARE_PROVIDER_SITE_OTHER): Payer: Self-pay | Admitting: Sports Medicine

## 2015-10-20 ENCOUNTER — Other Ambulatory Visit: Payer: Self-pay | Admitting: Sports Medicine

## 2015-10-20 VITALS — BP 118/69 | HR 81 | Temp 98.6°F | Resp 16 | Wt 276.0 lb

## 2015-10-20 DIAGNOSIS — M25519 Pain in unspecified shoulder: Secondary | ICD-10-CM

## 2015-10-20 DIAGNOSIS — E291 Testicular hypofunction: Secondary | ICD-10-CM

## 2015-10-20 MED ORDER — TESTOSTERONE CYPIONATE 200 MG/ML IM SOLN
300.0000 mg | INTRAMUSCULAR | Status: DC
  Administered 2015-10-20: 300 mg via INTRAMUSCULAR

## 2015-10-20 MED ORDER — TRAMADOL HCL 50 MG PO TABS: 50.0000 mg | ORAL_TABLET | Freq: Three times a day (TID) | ORAL | Status: DC | PRN

## 2015-10-20 NOTE — Progress Notes (Signed)
   Subjective:    Patient ID: Richard Beltran, male    DOB: 1965/06/06, 50 y.o.   MRN: [id number]  HPIPatient is here for a testosterone injection. Denies chest pain, shortness of breath, headaches and problems with medication or mood changes.    Review of Systems     Objective:   Physical Exam        Assessment & Plan:  Patient tolerated injection well without complications. Patient advised to schedule next injection in 14 days.

## 2015-11-06 ENCOUNTER — Ambulatory Visit: Payer: Self-pay

## 2015-11-20 ENCOUNTER — Other Ambulatory Visit: Payer: Self-pay

## 2015-11-20 DIAGNOSIS — J209 Acute bronchitis, unspecified: Secondary | ICD-10-CM

## 2015-11-20 MED ORDER — TRAMADOL HCL 50 MG PO TABS: ORAL_TABLET | ORAL | Status: DC

## 2015-11-23 ENCOUNTER — Other Ambulatory Visit: Payer: Self-pay | Admitting: Sports Medicine

## 2015-12-06 ENCOUNTER — Other Ambulatory Visit: Payer: Self-pay | Admitting: Sports Medicine

## 2015-12-18 ENCOUNTER — Other Ambulatory Visit: Payer: Self-pay | Admitting: Sports Medicine

## 2016-01-02 ENCOUNTER — Other Ambulatory Visit: Payer: Self-pay | Admitting: Sports Medicine

## 2016-01-06 ENCOUNTER — Other Ambulatory Visit: Payer: Self-pay

## 2016-01-06 MED ORDER — AMLODIPINE BESYLATE 10 MG PO TABS: 10.0000 mg | ORAL_TABLET | Freq: Every day | ORAL | 1 refills | Status: DC

## 2016-01-19 ENCOUNTER — Other Ambulatory Visit: Payer: Self-pay | Admitting: Sports Medicine

## 2016-02-16 ENCOUNTER — Other Ambulatory Visit: Payer: Self-pay | Admitting: Sports Medicine

## 2016-03-13 ENCOUNTER — Other Ambulatory Visit: Payer: Self-pay | Admitting: Sports Medicine

## 2016-04-09 ENCOUNTER — Other Ambulatory Visit: Payer: Self-pay | Admitting: Sports Medicine

## 2016-04-11 ENCOUNTER — Other Ambulatory Visit: Payer: Self-pay | Admitting: Sports Medicine

## 2016-05-05 ENCOUNTER — Other Ambulatory Visit: Payer: Self-pay | Admitting: Sports Medicine

## 2016-06-01 ENCOUNTER — Other Ambulatory Visit: Payer: Self-pay | Admitting: Sports Medicine

## 2016-06-17 ENCOUNTER — Other Ambulatory Visit: Payer: Self-pay | Admitting: Sports Medicine

## 2016-06-18 ENCOUNTER — Encounter: Payer: Self-pay | Admitting: *Deleted

## 2016-06-18 ENCOUNTER — Emergency Department (INDEPENDENT_AMBULATORY_CARE_PROVIDER_SITE_OTHER): Payer: Self-pay

## 2016-06-18 ENCOUNTER — Telehealth: Payer: Self-pay | Admitting: Family Medicine

## 2016-06-18 ENCOUNTER — Emergency Department
Admission: EM | Admit: 2016-06-18 | Discharge: 2016-06-18 | Disposition: A | Discharge status: Home / Self Care | Payer: Self-pay | Source: Home / Self Care | Attending: Family Medicine | Admitting: Family Medicine

## 2016-06-18 DIAGNOSIS — R519 Headache, unspecified: Secondary | ICD-10-CM

## 2016-06-18 DIAGNOSIS — IMO0001 Reserved for inherently not codable concepts without codable children: Secondary | ICD-10-CM

## 2016-06-18 DIAGNOSIS — R51 Headache: Secondary | ICD-10-CM

## 2016-06-18 LAB — POCT CBC W AUTO DIFF (K'VILLE URGENT CARE)

## 2016-06-18 LAB — COMPLETE METABOLIC PANEL WITH GFR
ALT: 31 U/L (ref 9–46)
AST: 20 U/L (ref 10–35)
Albumin: 4.3 g/dL (ref 3.6–5.1)
Alkaline Phosphatase: 64 U/L (ref 40–115)
BUN: 14 mg/dL (ref 7–25)
CHLORIDE: 102 mmol/L (ref 98–110)
CO2: 29 mmol/L (ref 20–31)
CREATININE: 1.27 mg/dL (ref 0.70–1.33)
Calcium: 9.6 mg/dL (ref 8.6–10.3)
GFR, EST AFRICAN AMERICAN: 76 mL/min (ref >=60)
GFR, Est Non African American: 65 mL/min (ref >=60)
GLUCOSE: 91 mg/dL (ref 65–99)
Potassium: 4.1 mmol/L (ref 3.5–5.3)
SODIUM: 137 mmol/L (ref 135–146)
TOTAL PROTEIN: 6.9 g/dL (ref 6.1–8.1)
Total Bilirubin: 0.6 mg/dL (ref 0.2–1.2)

## 2016-06-18 LAB — POCT URINALYSIS DIP (MANUAL ENTRY)
BILIRUBIN UA: NEGATIVE
GLUCOSE UA: NEGATIVE
Ketones, POC UA: NEGATIVE
LEUKOCYTES UA: NEGATIVE
Nitrite, UA: NEGATIVE
Protein Ur, POC: NEGATIVE
RBC UA: NEGATIVE
Spec Grav, UA: 1.025 (ref 1.005–1.03)
UROBILINOGEN UA: 0.2 (ref 0–1)
pH, UA: 6.5 (ref 5–8)

## 2016-06-18 LAB — C-REACTIVE PROTEIN: CRP: 4.2 mg/L (ref <8.0)

## 2016-06-18 LAB — SEDIMENTATION RATE: SED RATE: 11 mm/h (ref 0–15)

## 2016-06-18 MED ORDER — KETOROLAC TROMETHAMINE 30 MG/ML IJ SOLN
30.0000 mg | Freq: Once | INTRAMUSCULAR | Status: AC
  Administered 2016-06-18: 30 mg via INTRAMUSCULAR

## 2016-06-18 MED ORDER — HYDROCODONE-ACETAMINOPHEN 5-325 MG PO TABS: 1.0000 | ORAL_TABLET | ORAL | 0 refills | Status: DC | PRN

## 2016-06-18 MED ORDER — PREDNISONE 20 MG PO TABS: ORAL_TABLET | ORAL | 0 refills | Status: DC

## 2016-06-18 NOTE — ED Triage Notes (Signed)
Patient c/o HA with gradual worsening onset x 2 days. Also c/o low back pain around the same time. Awoke this AM with a broken blood vessel in right eye. Took Aleve yesterday. Denies nausea.

## 2016-06-18 NOTE — Telephone Encounter (Signed)
Notified patient of results and recommendations.  He said his headache is some better.  Will follow up with Dr. Darene Lamer next week.

## 2016-06-18 NOTE — Telephone Encounter (Signed)
CRP and Sed Rate normal.  Vertebral osteomyelitis unlikely. Will begin prednisone burst/taper.

## 2016-06-18 NOTE — Discharge Instructions (Signed)
Apply ice pack to lower back for 20 to 30 minutes, 3 to 4 times daily  Continue until pain and swelling decrease.  ° ° °

## 2016-06-18 NOTE — ED Provider Notes (Signed)
[facility]    CSN: [id number] Arrival date & time: 06/18/16  [id number]     History   Chief Complaint Chief Complaint  Patient presents with  . Headache    HPI DMETRI ACRI is a 51 y.o. male.   Patient awoke two days ago with throbbing frontal headache that subsequently became generalized.  About two hours later he developed low back pain that began radiating intermittently to his right leg.  His headache has persisted, and he states that it is the worst he has ever had (he rarely has a headache).  He denies fevers, chills, and sweats.  No localizing neurologic symptoms.  He has felt fatigued and is having difficulty sleeping.   He denies bowel or bladder dysfunction, and no saddle numbness.   He recalls no recent injury to his back. He has a past surgical history of lumbar disc surgery (three levels) in 2012.  He denies recent illness or injury; no recent dental work   The history is provided by the patient.  Back Pain  Location:  Lumbar spine Quality:  Aching Radiates to:  R posterior upper leg Pain severity:  Moderate Pain is:  Same all the time Onset quality:  Sudden Duration:  2 days Timing:  Constant Progression:  Unchanged Chronicity:  New Context: not lifting heavy objects, not recent illness, not recent injury and not twisting   Relieved by:  Nothing Worsened by:  Ambulation Ineffective treatments:  None tried Associated symptoms: headaches and leg pain   Associated symptoms: no abdominal pain, no bladder incontinence, no bowel incontinence, no chest pain, no dysuria, no fever, no numbness, no paresthesias, no perianal numbness, no tingling, no weakness and no weight loss   Headaches:    Severity:  Severe   Onset quality:  Sudden   Duration:  2 days   Timing:  Constant   Progression:  Unchanged   Chronicity:  New   Past Medical History:  Diagnosis Date  . Hypertension   . Lumbar degenerative disc disease 10/29/2013   Post L4-L5 fusion.   .  Male hypogonadism     Patient Active Problem List   Diagnosis Date Noted  . Insomnia 07/29/2015  . Obesity 11/18/2014  . Obstructive sleep apnea 10/22/2014  . Male hypogonadism 11/06/2013  . Hypertension, essential, benign 10/29/2013  . Lumbar degenerative disc disease 10/29/2013  . Pulmonary nodule 10/29/2013  . Annual physical exam 10/29/2013  . 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 BACK SURGERY         Home Medications    Prior to Admission medications   Medication Sig Start Date End Date Taking? Authorizing Provider  AMBULATORY NON FORMULARY MEDICATION CPAP with humidifier,mask, supplies Set to auto-titrate with range of 4-20 cm water pressure.  DX G47.33 Obstructive Sleep Apnea Please do download after 10 days on CPAP 01/27/15  Yes Silverio Decamp, MD  amLODipine (NORVASC) 10 MG tablet Take 1 tablet (10 mg total) by mouth daily. 01/06/16  Yes Silverio Decamp, MD  carvedilol (COREG) 25 MG tablet TAKE 1 TABLET TWICE A DAY WITH FOOD 09/14/15  Yes Silverio Decamp, MD  lisinopril-hydrochlorothiazide (PRINZIDE,ZESTORETIC) 20-25 MG tablet TAKE 1 TABLET BY MOUTH EVERY DAY 04/12/16  Yes Silverio Decamp, MD  meloxicam (MOBIC) 15 MG tablet TAKE 1 TABLET BY MOUTH EACH MORNING WITH BREAKFAST FOR 2 WEEKS THEN DAILY AS NEEDED 06/17/16  Yes Silverio Decamp, MD  traMADol (  ULTRAM) 50 MG tablet TAKE 1 TO 2 TABLETS BY MOUTH EVERY 8 HOURS AS NEEDED 06/01/16  Yes Silverio Decamp, MD  HYDROcodone-acetaminophen (NORCO/VICODIN) 5-325 MG tablet Take 1 tablet by mouth every 4 (four) hours as needed for severe pain. 06/18/16   Kandra Nicolas, MD  Lorcaserin HCl 10 MG TABS Take 10 mg by mouth 2 (two) times daily. 11/19/14   Golden Circle, FNP  Suvorexant (BELSOMRA) 10 MG TABS Take 1 tablet by mouth at bedtime. 07/29/15   Silverio Decamp, MD  testosterone cypionate (DEPOTESTOTERONE CYPIONATE) 100  MG/ML injection Inject 300 mg into the muscle every 14 (fourteen) days. For IM use only    Historical Provider, MD    Family History Family History  Problem Relation Age of Onset  . Diabetes Mother   . Hyperlipidemia Mother   . Healthy Father   . Healthy Maternal Grandmother   . Healthy Maternal Grandfather   . Healthy Paternal Grandmother   . Healthy Paternal Grandfather     Social History Social History  Substance Use Topics  . Smoking status: Never Smoker  . Smokeless tobacco: Never Used  . Alcohol use No     Allergies   Eggs or egg-derived products   Review of Systems Review of Systems  Constitutional: Negative for fever and weight loss.  Cardiovascular: Negative for chest pain.  Gastrointestinal: Negative for abdominal pain and bowel incontinence.  Genitourinary: Negative for bladder incontinence and dysuria.  Musculoskeletal: Positive for back pain.  Neurological: Positive for headaches. Negative for tingling, weakness, numbness and paresthesias.  All other systems reviewed and are negative.    Physical Exam Triage Vital Signs ED Triage Vitals  Enc Vitals Group     BP 06/18/16 0909 127/83     Pulse Rate 06/18/16 0909 71     Resp --      Temp 06/18/16 0909 98.2 F (36.8 C)     Temp Source 06/18/16 0909 Oral     SpO2 06/18/16 0909 96 %     Weight 06/18/16 0909 282 lb (127.9 kg)     Height --      Head Circumference --      Peak Flow --      Pain Score 06/18/16 0912 10     Pain Loc --      Pain Edu? --      Excl. in [place]? --    No data found.   Updated Vital Signs BP 133/87   Pulse 71   Temp 98.2 F (36.8 C) (Oral)   Wt 282 lb (127.9 kg)   SpO2 96%   BMI 37.21 kg/m   Visual Acuity Right Eye Distance:   Left Eye Distance:   Bilateral Distance:    Right Eye Near:   Left Eye Near:    Bilateral Near:     Physical Exam  Constitutional: He is oriented to person, place, and time. He appears well-developed and well-nourished. No distress.    HENT:  Head: Normocephalic.  Right Ear: Tympanic membrane, external ear and ear canal normal.  Left Ear: Tympanic membrane, external ear and ear canal normal.  Nose: Nose normal.  Mouth/Throat: Oropharynx is clear and moist.  Eyes: EOM and lids are normal. Pupils are equal, round, and reactive to light. Right conjunctiva has a hemorrhage.    Fundi benign.  No photophobia. Minimal right sub-conjunctival hemorrhage.  Neck: Neck supple. No thyromegaly present.  Cardiovascular: Normal rate, normal heart sounds and intact distal pulses.  Pulmonary/Chest: Breath sounds normal.  Abdominal: There is no tenderness.  Musculoskeletal:       Lumbar back: He exhibits tenderness.       Back:  Back:  Range of motion decreased. Can heel/toe walk and squat without difficulty.  There is distinct tenderness to palpation over midline lumbar surgical scar, extending to right paraspinous area from approximately L2 to sacral area.  No erythema or warmth. Straight leg raising test is positive on the right at about 15 degrees from horizontal.  Sitting knee extension test is positive on the right at about 45 degrees from horizontal.  Strength and sensation in the lower extremities is normal.  Patellar and achilles reflexes are normal.  Lymphadenopathy:    He has no cervical adenopathy.  Neurological: He is alert and oriented to person, place, and time. He has normal strength and normal reflexes. No cranial nerve deficit or sensory deficit. Coordination normal.  Skin: Skin is warm and dry. No rash noted.  Nursing note and vitals reviewed.    UC Treatments / Results  Labs (all labs ordered are listed, but only abnormal results are displayed) Labs Reviewed  SEDIMENTATION RATE  C-REACTIVE PROTEIN  COMPLETE METABOLIC PANEL WITH GFR  POCT CBC W AUTO DIFF ([facility]):  WBC 6.4; LY 39.2; MO 7.5; GR 53.3; Hgb 12.8; Platelets 241   POCT URINALYSIS DIP (MANUAL ENTRY) negative    EKG  EKG  Interpretation None       Radiology Ct Head Wo Contrast  Result Date: 06/18/2016 CLINICAL DATA:  Worsening headaches over the past 2 EXAM: CT HEAD WITHOUT CONTRAST TECHNIQUE: Contiguous axial images were obtained from the base of the skull through the vertex without intravenous contrast. COMPARISON:  None. FINDINGS: Brain: No evidence of acute infarction, hemorrhage, hydrocephalus, extra-axial collection or mass lesion/mass effect. Vascular: No hyperdense vessel or unexpected calcification. Skull: Normal. Negative for fracture or focal lesion. Sinuses/Orbits: No acute finding. Other: None. IMPRESSION: No acute abnormality noted. Electronically Signed   By: Inez Catalina M.D.   On: 06/18/2016 11:07    Procedures Procedures (including critical care time)  Medications Ordered in UC Medications  ketorolac (TORADOL) 30 MG/ML injection 30 mg (30 mg Intramuscular Given 06/18/16 0920)     Initial Impression / Assessment and Plan / UC Course  I have reviewed the triage vital signs and the nursing notes.  Pertinent labs & imaging results that were available during my care of the patient were reviewed by me and considered in my medical decision making (see chart for details).    Initial concern for vertebral osteomyelitis.  However, unlikely with normal WBC (6.4), CRP (4.2), and Sed Rate (11)   Negative CT head reassuring.  Administered Toradol 30mg  IM Note presence of anemia today (previous CBC 07/29/15 normal) Apply ice pack to lower back for 20 to 30 minutes, 3 to 4 times daily  Continue until pain and swelling decrease.  Followup with Family Doctor if not improved in about 4 days. With presence of lumbar radicular symptoms, will begin prednisone burst/taper. Rx written for Lortab Recommend follow-up with Family Doctor for evaluation of new onset anemia.    Final Clinical Impressions(s) / UC Diagnoses   Final diagnoses:  Acute intractable headache, unspecified headache type  Radicular  pain of right lower back    New Prescriptions New Prescriptions   HYDROCODONE-ACETAMINOPHEN (NORCO/VICODIN) 5-325 MG TABLET    Take 1 tablet by mouth every 4 (four) hours as needed for severe pain.  Kandra Nicolas, MD 06/21/16 [phone]

## 2016-06-28 ENCOUNTER — Other Ambulatory Visit: Payer: Self-pay | Admitting: Sports Medicine

## 2016-07-21 ENCOUNTER — Other Ambulatory Visit: Payer: Self-pay | Admitting: Sports Medicine

## 2016-08-12 ENCOUNTER — Other Ambulatory Visit: Payer: Self-pay | Admitting: Sports Medicine

## 2016-08-16 ENCOUNTER — Other Ambulatory Visit: Payer: Self-pay | Admitting: Sports Medicine

## 2016-08-31 ENCOUNTER — Other Ambulatory Visit: Payer: Self-pay | Admitting: Sports Medicine

## 2016-09-22 ENCOUNTER — Other Ambulatory Visit: Payer: Self-pay | Admitting: Sports Medicine

## 2016-09-23 IMAGING — CR DG CHEST 2V
2 series · 2 of 2 positions shown · non-contrast
Comparison: Chest x-ray 08/08/2008.

CLINICAL DATA: 49-year-old male with history of cough and
congestion for the past 2 days.

EXAM:
CHEST  2 VIEW

[chest pa]
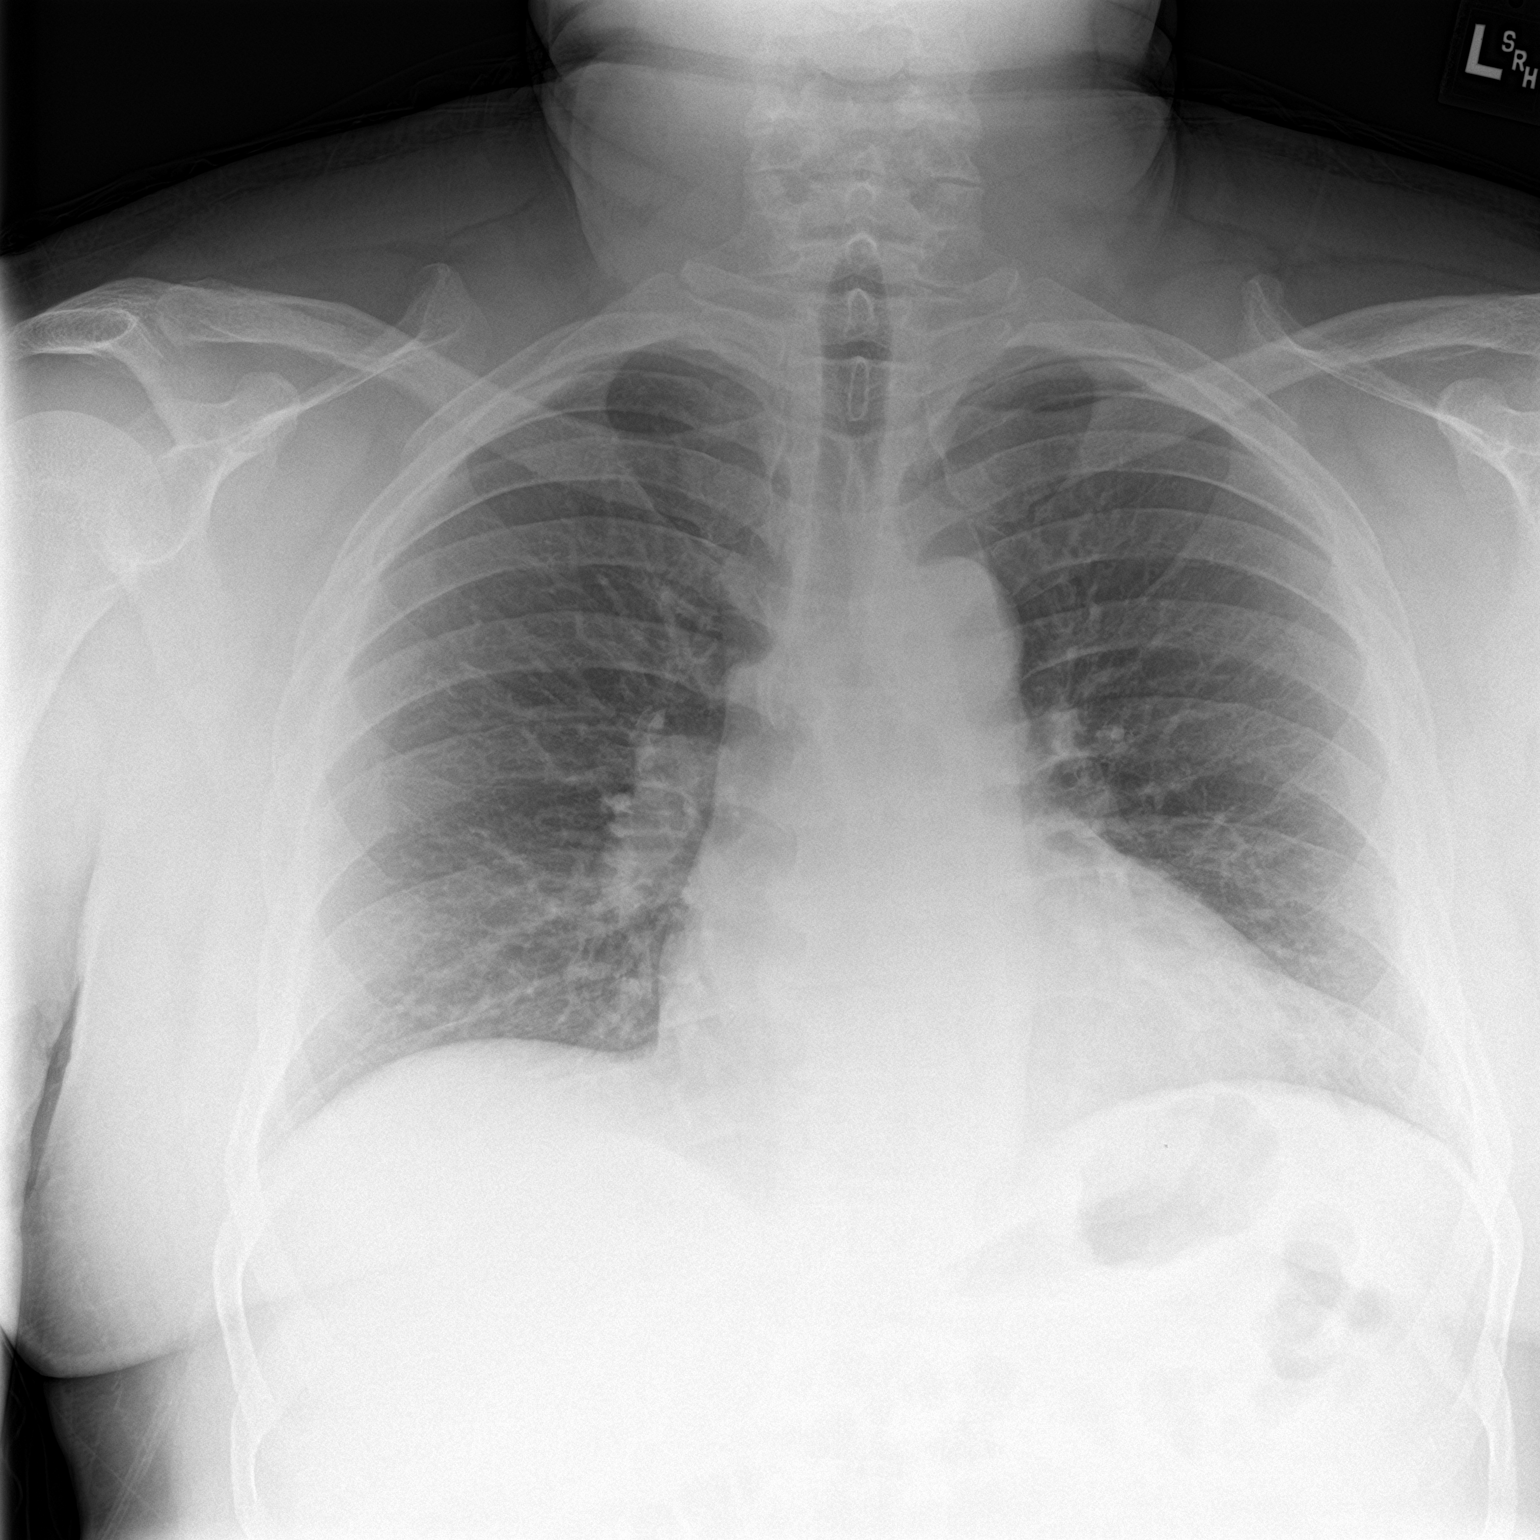

[chest lat]
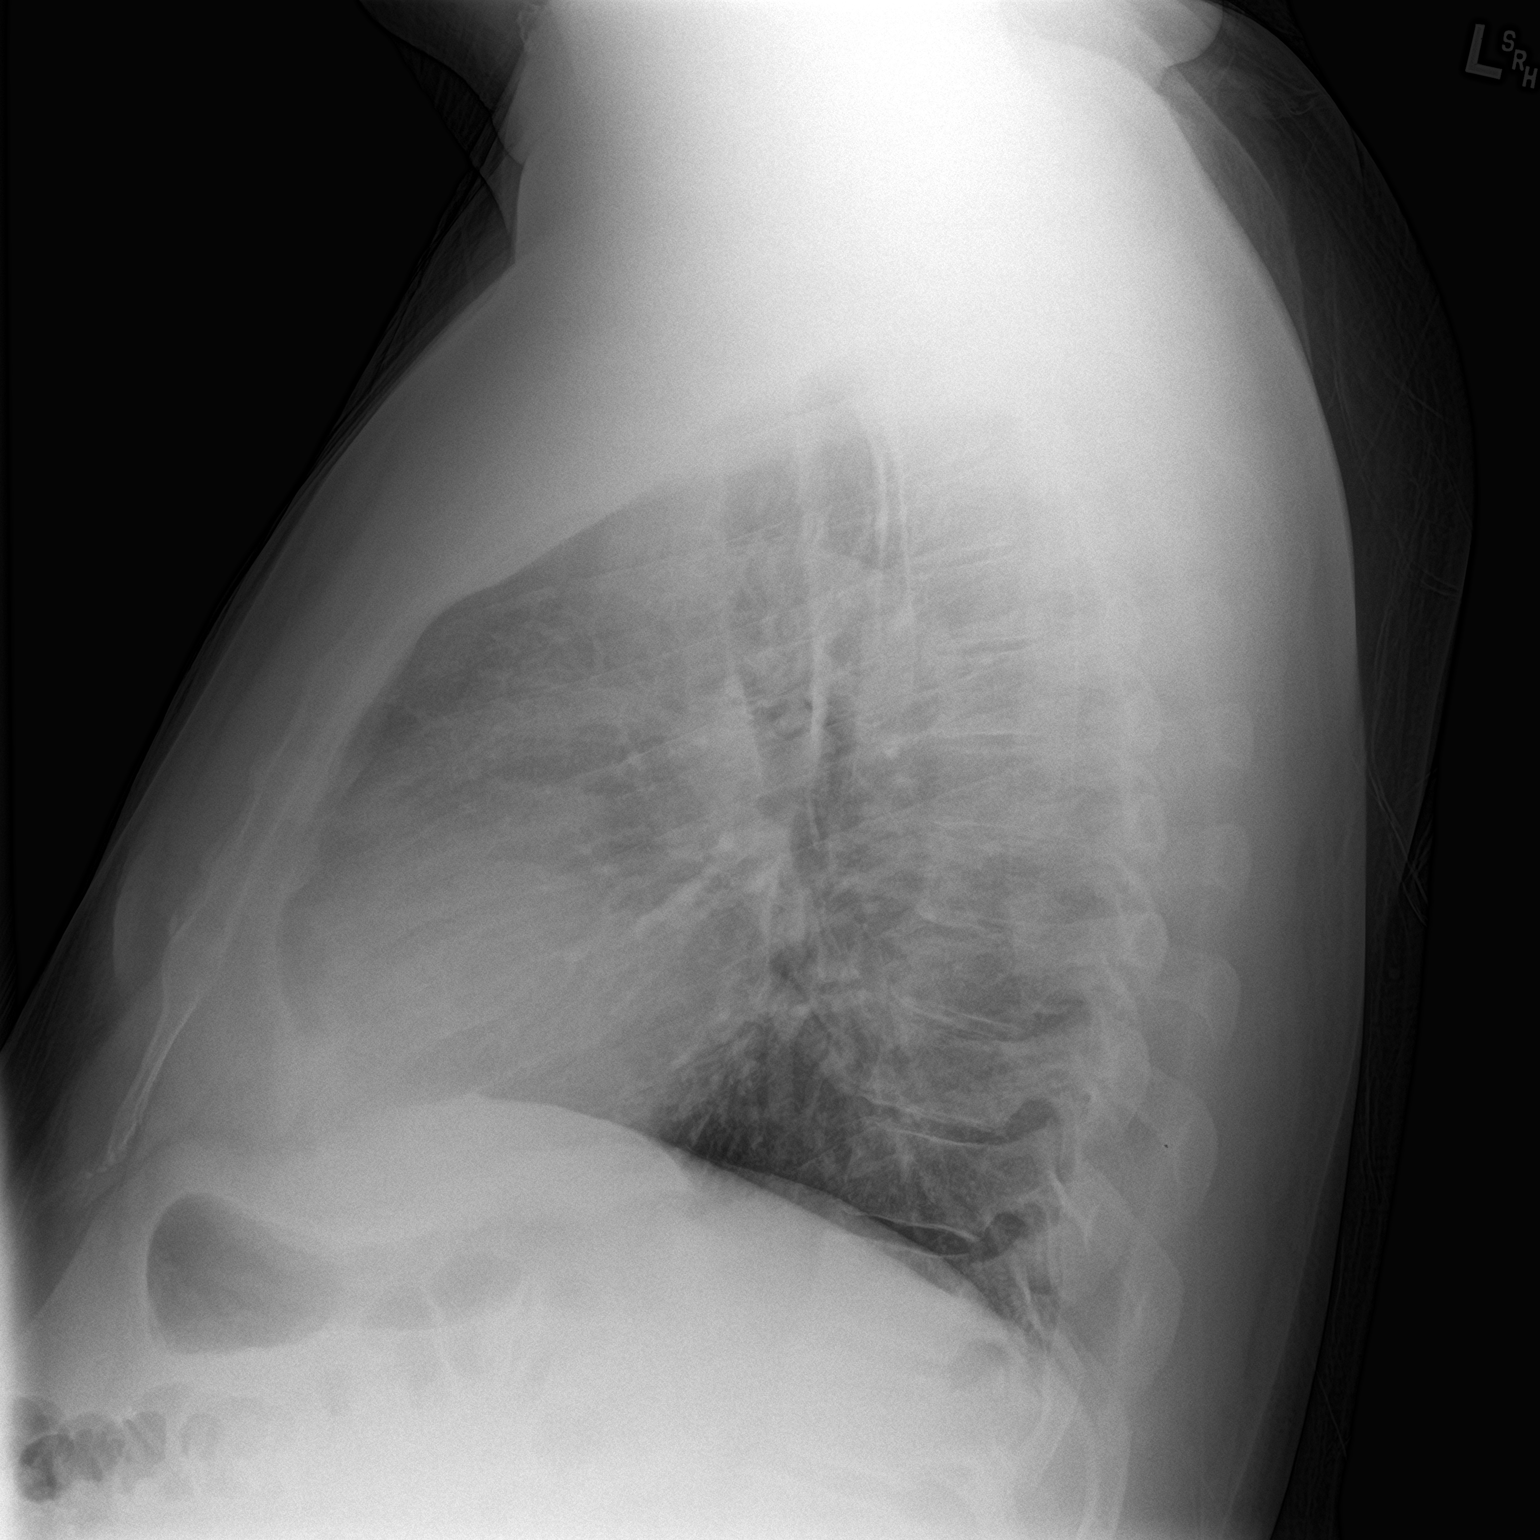

[2 of 2 positions shown; findings below may reference images not displayed]

FINDINGS: Lung volumes are low. No consolidative airspace disease. No pleural
effusions. No pneumothorax. No pulmonary nodule or mass noted.
Pulmonary vasculature and the cardiomediastinal silhouette are
within normal limits.
IMPRESSION: 1. Low lung volumes without radiographic evidence of acute
cardiopulmonary disease.

## 2016-09-28 ENCOUNTER — Other Ambulatory Visit: Payer: Self-pay | Admitting: Sports Medicine

## 2016-10-19 ENCOUNTER — Other Ambulatory Visit: Payer: Self-pay | Admitting: Sports Medicine

## 2016-11-09 ENCOUNTER — Other Ambulatory Visit: Payer: Self-pay | Admitting: Sports Medicine

## 2016-11-12 ENCOUNTER — Other Ambulatory Visit: Payer: Self-pay | Admitting: *Deleted

## 2016-11-12 MED ORDER — MELOXICAM 15 MG PO TABS: ORAL_TABLET | ORAL | 1 refills | Status: DC

## 2016-12-01 ENCOUNTER — Other Ambulatory Visit: Payer: Self-pay | Admitting: Sports Medicine

## 2016-12-06 ENCOUNTER — Other Ambulatory Visit: Payer: Self-pay | Admitting: Sports Medicine

## 2016-12-08 ENCOUNTER — Encounter: Payer: Self-pay | Admitting: Sports Medicine

## 2016-12-08 ENCOUNTER — Ambulatory Visit (INDEPENDENT_AMBULATORY_CARE_PROVIDER_SITE_OTHER): Payer: Self-pay | Admitting: Sports Medicine

## 2016-12-08 DIAGNOSIS — I10 Essential (primary) hypertension: Secondary | ICD-10-CM

## 2016-12-08 DIAGNOSIS — E6609 Other obesity due to excess calories: Secondary | ICD-10-CM

## 2016-12-08 DIAGNOSIS — Z0000 Encounter for general adult medical examination without abnormal findings: Secondary | ICD-10-CM

## 2016-12-08 DIAGNOSIS — E291 Testicular hypofunction: Secondary | ICD-10-CM

## 2016-12-08 LAB — CBC
HCT: 38.5 % (ref 38.5–50.0)
Hemoglobin: 12.4 g/dL — ABNORMAL LOW (ref 13.2–17.1)
MCH: 28.8 pg (ref 27.0–33.0)
MCHC: 32.2 g/dL (ref 32.0–36.0)
MCV: 89.5 fL (ref 80.0–100.0)
MPV: 9.1 fL (ref 7.5–12.5)
Platelets: 240 10*3/uL (ref 140–400)
RBC: 4.3 MIL/uL (ref 4.20–5.80)
RDW: 14 % (ref 11.0–15.0)
WBC: 6.9 10*3/uL (ref 3.8–10.8)

## 2016-12-08 LAB — TSH: TSH: 1.52 mIU/L (ref 0.40–4.50)

## 2016-12-08 MED ORDER — TESTOSTERONE CYPIONATE 200 MG/ML IM SOLN
200.0000 mg | INTRAMUSCULAR | Status: DC
  Administered 2016-12-08: 200 mg via INTRAMUSCULAR

## 2016-12-08 NOTE — Assessment & Plan Note (Signed)
Referral for bariatric surgery. 

## 2016-12-08 NOTE — Progress Notes (Signed)
  Subjective:    CC: Follow-up  HPI: Hypertension: Well controlled  Obesity: Agreeable to proceed with bariatric surgery, we tried phentermine, Belviq without much improvement.  Male hypogonadism: Would like to get started again on testosterone shots.  Preventive measures:  Due for some routine blood work, colon cancer screening.  Past medical history:  Negative.  See flowsheet/record as well for more information.  Surgical history: Negative.  See flowsheet/record as well for more information.  Family history: Negative.  See flowsheet/record as well for more information.  Social history: Negative.  See flowsheet/record as well for more information.  Allergies, and medications have been entered into the medical record, reviewed, and no changes needed.   Review of Systems: No fevers, chills, night sweats, weight loss, chest pain, or shortness of breath.   Objective:    General: Well Developed, well nourished, and in no acute distress.  Neuro: Alert and oriented x3, extra-ocular muscles intact, sensation grossly intact.  HEENT: Normocephalic, atraumatic, pupils equal round reactive to light, neck supple, no masses, no lymphadenopathy, thyroid nonpalpable.  Skin: Warm and dry, no rashes. Cardiac: Regular rate and rhythm, no murmurs rubs or gallops, no lower extremity edema.  Respiratory: Clear to auscultation bilaterally. Not using accessory muscles, speaking in full sentences.  Impression and Recommendations:    Annual physical exam Checking routine blood work. Also ordering ColoGuard.  Hypertension, essential, benign Well-controlled, no changes needed  Male hypogonadism Patient has been off testosterone injections for 10 months now. Starting his new testosterone injection today. He will come every 2 weeks.  Obesity Referral for bariatric surgery

## 2016-12-08 NOTE — Assessment & Plan Note (Signed)
Patient has been off testosterone injections for 10 months now. Starting his new testosterone injection today. He will come every 2 weeks.

## 2016-12-08 NOTE — Assessment & Plan Note (Signed)
Well-controlled, no changes needed 

## 2016-12-08 NOTE — Assessment & Plan Note (Addendum)
Checking routine blood work. Also ordering ColoGuard.

## 2016-12-08 NOTE — Addendum Note (Signed)
Addended by: Teddy Spike on: 12/08/2016 10:58 AM   Modules accepted: Orders

## 2016-12-09 LAB — COMPREHENSIVE METABOLIC PANEL
AST: 23 U/L (ref 10–35)
BUN: 16 mg/dL (ref 7–25)
CO2: 21 mmol/L (ref 20–32)
Chloride: 105 mmol/L (ref 98–110)
Creat: 1.35 mg/dL — ABNORMAL HIGH (ref 0.70–1.33)
Glucose, Bld: 88 mg/dL (ref 65–99)
Sodium: 141 mmol/L (ref 135–146)
Total Bilirubin: 0.5 mg/dL (ref 0.2–1.2)

## 2016-12-09 LAB — COMPREHENSIVE METABOLIC PANEL WITH GFR
ALT: 37 U/L (ref 9–46)
Albumin: 4.2 g/dL (ref 3.6–5.1)
Alkaline Phosphatase: 76 U/L (ref 40–115)
Calcium: 9.4 mg/dL (ref 8.6–10.3)
Potassium: 4.4 mmol/L (ref 3.5–5.3)
Total Protein: 6.8 g/dL (ref 6.1–8.1)

## 2016-12-09 LAB — HIV ANTIBODY (ROUTINE TESTING W REFLEX): HIV 1&2 Ab, 4th Generation: NONREACTIVE

## 2016-12-09 LAB — LIPID PANEL W/REFLEX DIRECT LDL
Cholesterol: 211 mg/dL — ABNORMAL HIGH (ref <200)
HDL: 39 mg/dL — ABNORMAL LOW (ref >40)
LDL-Cholesterol: 149 mg/dL — ABNORMAL HIGH
Non-HDL Cholesterol (Calc): 172 mg/dL — ABNORMAL HIGH (ref <130)
Total CHOL/HDL Ratio: 5.4 Ratio — ABNORMAL HIGH (ref <5.0)
Triglycerides: 117 mg/dL (ref <150)

## 2016-12-09 LAB — PSA, TOTAL AND FREE
PSA, % Free: 33 % (ref >25)
PSA, Free: 0.2 ng/mL
PSA, Total: 0.6 ng/mL (ref <=4.0)

## 2016-12-09 LAB — HEMOGLOBIN A1C
Hgb A1c MFr Bld: 5.1 % (ref <5.7)
Mean Plasma Glucose: 100 mg/dL

## 2016-12-11 ENCOUNTER — Other Ambulatory Visit: Payer: Self-pay | Admitting: Sports Medicine

## 2016-12-20 ENCOUNTER — Ambulatory Visit (INDEPENDENT_AMBULATORY_CARE_PROVIDER_SITE_OTHER): Payer: Self-pay | Admitting: Sports Medicine

## 2016-12-20 VITALS — BP 138/81 | HR 72

## 2016-12-20 DIAGNOSIS — E291 Testicular hypofunction: Secondary | ICD-10-CM

## 2016-12-20 MED ORDER — TESTOSTERONE CYPIONATE 200 MG/ML IM SOLN
200.0000 mg | Freq: Once | INTRAMUSCULAR | Status: AC
  Administered 2016-12-20: 200 mg via INTRAMUSCULAR

## 2016-12-20 NOTE — Progress Notes (Signed)
Patient came into office today for testosterone injection. Denies chest pain, shortness of breath, headaches and problems associated with taking this medication. Patient states he has had no abnornal mood swings. Patient tolerated injection in LUOQ well without complications. Patient advised to schedule his next injection for 2 weeks from today. 

## 2016-12-31 ENCOUNTER — Other Ambulatory Visit: Payer: Self-pay | Admitting: Sports Medicine

## 2017-01-04 ENCOUNTER — Ambulatory Visit (INDEPENDENT_AMBULATORY_CARE_PROVIDER_SITE_OTHER): Payer: Self-pay | Admitting: Sports Medicine

## 2017-01-04 VITALS — BP 134/88 | HR 70

## 2017-01-04 DIAGNOSIS — E291 Testicular hypofunction: Secondary | ICD-10-CM

## 2017-01-04 MED ORDER — TESTOSTERONE CYPIONATE 200 MG/ML IM SOLN
200.0000 mg | Freq: Once | INTRAMUSCULAR | Status: AC
  Administered 2017-01-04: 200 mg via INTRAMUSCULAR

## 2017-01-04 MED ORDER — TRAMADOL HCL 50 MG PO TABS: 50.0000 mg | ORAL_TABLET | Freq: Four times a day (QID) | ORAL | 0 refills | Status: DC | PRN

## 2017-01-04 NOTE — Progress Notes (Signed)
Pt presents to the clinic for a testosterone injection.  Denies any complications with the medication. Pt tolerated injection well in the RUOQ. Pt was advised to make next appointment in 2 weeks. -EH/RMA

## 2017-01-18 ENCOUNTER — Ambulatory Visit (INDEPENDENT_AMBULATORY_CARE_PROVIDER_SITE_OTHER): Payer: Self-pay | Admitting: Sports Medicine

## 2017-01-18 VITALS — BP 138/68 | HR 64 | Ht 73.0 in | Wt 289.1 lb

## 2017-01-18 DIAGNOSIS — E291 Testicular hypofunction: Secondary | ICD-10-CM

## 2017-01-18 MED ORDER — TESTOSTERONE CYPIONATE 200 MG/ML IM SOLN
200.0000 mg | Freq: Once | INTRAMUSCULAR | Status: AC
  Administered 2017-01-18: 200 mg via INTRAMUSCULAR

## 2017-01-18 NOTE — Progress Notes (Signed)
   Subjective:    Patient ID: Richard Beltran, male    DOB: 05/27/1965, 51 y.o.   MRN: [id number]  HPI Pt is here for a Testosterone Injection today. Pt denies mood swings, palpitations, chest pain, or any medication problems.     Review of Systems     Objective:   Physical Exam        Assessment & Plan:  Pt tolerated injection in right well and without complications. Pt advised to schedule a nurse visit in 2 weeks.

## 2017-01-26 ENCOUNTER — Other Ambulatory Visit: Payer: Self-pay | Admitting: Sports Medicine

## 2017-01-26 LAB — TESTOSTERONE: Testosterone: 800 ng/dL (ref 250–827)

## 2017-01-27 ENCOUNTER — Other Ambulatory Visit: Payer: Self-pay | Admitting: Sports Medicine

## 2017-01-31 ENCOUNTER — Ambulatory Visit (INDEPENDENT_AMBULATORY_CARE_PROVIDER_SITE_OTHER): Payer: Self-pay | Admitting: Sports Medicine

## 2017-01-31 VITALS — BP 138/76 | HR 87 | Wt 290.0 lb

## 2017-01-31 DIAGNOSIS — E291 Testicular hypofunction: Secondary | ICD-10-CM

## 2017-01-31 MED ORDER — TESTOSTERONE CYPIONATE 200 MG/ML IM SOLN
200.0000 mg | Freq: Once | INTRAMUSCULAR | Status: AC
  Administered 2017-01-31: 200 mg via INTRAMUSCULAR

## 2017-01-31 NOTE — Progress Notes (Signed)
   Subjective:    Patient ID: Richard Beltran, male    DOB: 10-30-1965, 51 y.o.   MRN: [id number]  HPI  GIOVONI BUNCH is here for a testosterone injection. Denies chest pain, shortness of breath, headaches or mood changes.    Review of Systems     Objective:   Physical Exam        Assessment & Plan:  Patient tolerated injection well without complications. Patient advised to schedule next injection 14 days from today.

## 2017-02-14 ENCOUNTER — Ambulatory Visit (INDEPENDENT_AMBULATORY_CARE_PROVIDER_SITE_OTHER): Payer: Self-pay | Admitting: Sports Medicine

## 2017-02-14 VITALS — BP 127/66 | HR 57

## 2017-02-14 DIAGNOSIS — E291 Testicular hypofunction: Secondary | ICD-10-CM

## 2017-02-14 MED ORDER — TESTOSTERONE CYPIONATE 200 MG/ML IM SOLN
200.0000 mg | Freq: Once | INTRAMUSCULAR | Status: AC
  Administered 2017-02-14: 200 mg via INTRAMUSCULAR

## 2017-02-14 NOTE — Progress Notes (Signed)
Testosterone given RUOQ with no immediate complications.

## 2017-02-21 ENCOUNTER — Telehealth: Payer: Self-pay | Admitting: Sports Medicine

## 2017-02-21 NOTE — Telephone Encounter (Signed)
Pt requesting a refill on his tramadol & meloxicam. Prescription needs to be sent to [facility] on [address], [place]. He is leaving town on Wednesday morning and needs these filled before he leaves. Please advise. Thanks!

## 2017-02-22 MED ORDER — TRAMADOL HCL 50 MG PO TABS: 50.0000 mg | ORAL_TABLET | Freq: Four times a day (QID) | ORAL | 0 refills | Status: DC | PRN

## 2017-02-22 MED ORDER — MELOXICAM 15 MG PO TABS: ORAL_TABLET | ORAL | 1 refills | Status: DC

## 2017-02-22 NOTE — Telephone Encounter (Signed)
Refills sent. Pt advised.

## 2017-02-28 ENCOUNTER — Ambulatory Visit: Payer: Self-pay

## 2017-03-01 ENCOUNTER — Ambulatory Visit (INDEPENDENT_AMBULATORY_CARE_PROVIDER_SITE_OTHER): Payer: Self-pay | Admitting: Sports Medicine

## 2017-03-01 VITALS — BP 130/75 | HR 73

## 2017-03-01 DIAGNOSIS — E291 Testicular hypofunction: Secondary | ICD-10-CM

## 2017-03-01 MED ORDER — TESTOSTERONE CYPIONATE 200 MG/ML IM SOLN
200.0000 mg | Freq: Once | INTRAMUSCULAR | Status: AC
  Administered 2017-03-01: 200 mg via INTRAMUSCULAR

## 2017-03-01 NOTE — Progress Notes (Signed)
Patient came into office today for testosterone injection. Denies chest pain, shortness of breath, headaches and problems associated with taking this medication. Patient states he has had no abnornal mood swings. Patient tolerated injection in LUOQ well without complications. Patient advised to schedule his next injection for 2 weeks from today. 

## 2017-03-14 ENCOUNTER — Other Ambulatory Visit: Payer: Self-pay | Admitting: Sports Medicine

## 2017-03-14 ENCOUNTER — Ambulatory Visit: Payer: Self-pay

## 2017-03-15 ENCOUNTER — Ambulatory Visit (INDEPENDENT_AMBULATORY_CARE_PROVIDER_SITE_OTHER): Payer: Self-pay | Admitting: Sports Medicine

## 2017-03-15 ENCOUNTER — Telehealth: Payer: Self-pay | Admitting: Emergency Medicine

## 2017-03-15 VITALS — BP 137/90 | HR 68 | Resp 16 | Wt 288.0 lb

## 2017-03-15 DIAGNOSIS — E291 Testicular hypofunction: Secondary | ICD-10-CM

## 2017-03-15 MED ORDER — TESTOSTERONE CYPIONATE 200 MG/ML IM SOLN
200.0000 mg | INTRAMUSCULAR | Status: DC
  Administered 2017-03-15: 200 mg via INTRAMUSCULAR

## 2017-03-15 NOTE — Progress Notes (Signed)
   Subjective:    Patient ID: Richard Beltran, male    DOB: 18-Jul-1965, 51 y.o.   MRN: [id number]  HPI  Patient is here for testosterone injection. Denies chest pain, shortness of breath, headaches and problems with medication or mood changes.    Review of Systems     Objective:   Physical Exam        Assessment & Plan:  Patient tolerated injection up RUQ well without complications. Patient advised to schedule next injection in 14 days. Requesting early refill on tramadol and meloxicam prior to travel for holidays. pk

## 2017-03-21 ENCOUNTER — Other Ambulatory Visit: Payer: Self-pay | Admitting: Sports Medicine

## 2017-03-21 MED ORDER — TRAMADOL HCL 50 MG PO TABS: 50.0000 mg | ORAL_TABLET | Freq: Four times a day (QID) | ORAL | 0 refills | Status: DC | PRN

## 2017-03-21 NOTE — Telephone Encounter (Signed)
Pt came in to request a refill on his tramadol. It looked as if he had refills left on his prescription, so I suggested he call the pharmacy to ask about the refills. He stated that he had just come from the pharmacy, and they needed the prescription sent over by [place]. Please advise. Patient is leaving town tonight and would like to have his refill before he leaves. Thanks!

## 2017-03-21 NOTE — Telephone Encounter (Signed)
Patient called to check on Tramadol refill request he forgot to ask for it this morning in his appointment and is going out of town for [place] and he is completely out. Please Adv

## 2017-03-21 NOTE — Addendum Note (Signed)
Addended by: Narda Rutherford on: 03/21/2017 02:53 PM   Modules accepted: Orders

## 2017-03-29 ENCOUNTER — Ambulatory Visit (INDEPENDENT_AMBULATORY_CARE_PROVIDER_SITE_OTHER): Payer: Self-pay | Admitting: Sports Medicine

## 2017-03-29 VITALS — BP 139/82 | HR 72 | Resp 16 | Wt 281.0 lb

## 2017-03-29 DIAGNOSIS — E291 Testicular hypofunction: Secondary | ICD-10-CM

## 2017-03-29 MED ORDER — TESTOSTERONE CYPIONATE 200 MG/ML IM SOLN
200.0000 mg | INTRAMUSCULAR | Status: DC
  Administered 2017-03-29: 200 mg via INTRAMUSCULAR

## 2017-03-29 NOTE — Progress Notes (Signed)
   Subjective:    Patient ID: Richard Beltran, male    DOB: Jul 16, 1965, 51 y.o.   MRN: [id number]  HPI  Patient here for testosterone injection. Denies chest pain, headaches, mood changes and all problems with medications.    Review of Systems     Objective:   Physical Exam        Assessment & Plan:  Patient tolerated injection well without complications. Patient advised to schedule next appointment for 14 days.

## 2017-04-12 ENCOUNTER — Ambulatory Visit (INDEPENDENT_AMBULATORY_CARE_PROVIDER_SITE_OTHER): Payer: Self-pay | Admitting: Sports Medicine

## 2017-04-12 VITALS — BP 156/83 | HR 72 | Wt 288.0 lb

## 2017-04-12 DIAGNOSIS — I10 Essential (primary) hypertension: Secondary | ICD-10-CM

## 2017-04-12 DIAGNOSIS — E291 Testicular hypofunction: Secondary | ICD-10-CM

## 2017-04-12 MED ORDER — TESTOSTERONE CYPIONATE 200 MG/ML IM SOLN
200.0000 mg | Freq: Once | INTRAMUSCULAR | Status: AC
  Administered 2017-04-12: 200 mg via INTRAMUSCULAR

## 2017-04-12 NOTE — Progress Notes (Signed)
   Subjective:    Patient ID: Richard Beltran, male    DOB: [birth year], 51 y.o.   MRN: [id number]  HPI  BERMAN GRAINGER is here for a testosterone injection. Denies chest pain, shortness of breath, headaches or mood changes.    Review of Systems     Objective:   Physical Exam        Assessment & Plan:  Patient tolerated injection well without complications. Patient advised to schedule next injection 14 days from today.

## 2017-04-12 NOTE — Assessment & Plan Note (Signed)
Blood pressure is elevated today but has been controlled in the past, no changes and we will just simply keep an eye on this over the next few testosterone injection visits.

## 2017-04-14 ENCOUNTER — Other Ambulatory Visit: Payer: Self-pay | Admitting: Sports Medicine

## 2017-04-16 ENCOUNTER — Other Ambulatory Visit: Payer: Self-pay | Admitting: Sports Medicine

## 2017-04-27 ENCOUNTER — Ambulatory Visit (INDEPENDENT_AMBULATORY_CARE_PROVIDER_SITE_OTHER): Payer: Self-pay | Admitting: Physician Assistant

## 2017-04-27 VITALS — BP 138/76 | HR 72 | Wt 288.0 lb

## 2017-04-27 DIAGNOSIS — E291 Testicular hypofunction: Secondary | ICD-10-CM

## 2017-04-27 MED ORDER — TESTOSTERONE CYPIONATE 200 MG/ML IM SOLN
200.0000 mg | INTRAMUSCULAR | Status: DC
  Administered 2017-04-27: 200 mg via INTRAMUSCULAR

## 2017-04-27 NOTE — Progress Notes (Signed)
Patient was in the office for testosterone injection. 200 ml of Depo Testosterone was give RUOQ.   Patient denied any headache, dizziness, or shortness of breath.  Clayton Jarmon,CMA

## 2017-05-10 ENCOUNTER — Ambulatory Visit (INDEPENDENT_AMBULATORY_CARE_PROVIDER_SITE_OTHER): Payer: Self-pay | Admitting: Sports Medicine

## 2017-05-10 VITALS — BP 127/68 | HR 61 | Wt 289.0 lb

## 2017-05-10 DIAGNOSIS — E291 Testicular hypofunction: Secondary | ICD-10-CM

## 2017-05-10 MED ORDER — TESTOSTERONE CYPIONATE 200 MG/ML IM SOLN
200.0000 mg | Freq: Once | INTRAMUSCULAR | Status: AC
  Administered 2017-05-10: 200 mg via INTRAMUSCULAR

## 2017-05-10 NOTE — Progress Notes (Signed)
Patient came into office today for testosterone injection. Denies chest pain, shortness of breath, headaches and problems associated with taking this medication. Patient states he has had no abnornal mood swings. Patient tolerated injection in LUOQ well without complications. Patient advised to schedule his next injection for 2 weeks from today. 

## 2017-05-13 ENCOUNTER — Telehealth: Payer: Self-pay

## 2017-05-13 ENCOUNTER — Other Ambulatory Visit: Payer: Self-pay

## 2017-05-13 MED ORDER — LISINOPRIL-HYDROCHLOROTHIAZIDE 20-25 MG PO TABS: 1.0000 | ORAL_TABLET | Freq: Every day | ORAL | 1 refills | Status: DC

## 2017-05-13 MED ORDER — TRAMADOL HCL 50 MG PO TABS: 50.0000 mg | ORAL_TABLET | Freq: Four times a day (QID) | ORAL | 0 refills | Status: DC | PRN

## 2017-05-13 MED ORDER — MELOXICAM 15 MG PO TABS: ORAL_TABLET | ORAL | 1 refills | Status: DC

## 2017-05-13 MED ORDER — CARVEDILOL 25 MG PO TABS: 25.0000 mg | ORAL_TABLET | Freq: Two times a day (BID) | ORAL | 1 refills | Status: DC

## 2017-05-13 NOTE — Telephone Encounter (Signed)
Medications sent to pharmacy

## 2017-05-13 NOTE — Telephone Encounter (Signed)
PT needs a refill on the following medication: Coreg Mobic Ultram Prinzide  He was told by the pharmacy that the request was sent in and they have not received response back from our office.

## 2017-05-24 ENCOUNTER — Ambulatory Visit: Payer: Self-pay

## 2017-05-25 ENCOUNTER — Ambulatory Visit (INDEPENDENT_AMBULATORY_CARE_PROVIDER_SITE_OTHER): Payer: Self-pay | Admitting: Sports Medicine

## 2017-05-25 VITALS — BP 125/76 | HR 61 | Wt 282.0 lb

## 2017-05-25 DIAGNOSIS — E291 Testicular hypofunction: Secondary | ICD-10-CM

## 2017-05-25 MED ORDER — TESTOSTERONE CYPIONATE 200 MG/ML IM SOLN
200.0000 mg | Freq: Once | INTRAMUSCULAR | Status: AC
  Administered 2017-05-25: 200 mg via INTRAMUSCULAR

## 2017-05-25 NOTE — Progress Notes (Signed)
Patient came into office today for testosterone injection. Denies chest pain, shortness of breath, headaches and problems associated with taking this medication. Patient states he has had no abnornal mood swings. Patient tolerated injection in RUOQ well without complications. Patient advised to schedule his next injection for 2 weeks from today. 

## 2017-06-08 ENCOUNTER — Other Ambulatory Visit: Payer: Self-pay

## 2017-06-08 ENCOUNTER — Ambulatory Visit (INDEPENDENT_AMBULATORY_CARE_PROVIDER_SITE_OTHER): Payer: Self-pay | Admitting: Sports Medicine

## 2017-06-08 VITALS — BP 139/83 | HR 62 | Resp 16 | Wt 282.0 lb

## 2017-06-08 DIAGNOSIS — I10 Essential (primary) hypertension: Secondary | ICD-10-CM

## 2017-06-08 DIAGNOSIS — E291 Testicular hypofunction: Secondary | ICD-10-CM

## 2017-06-08 MED ORDER — TRAMADOL HCL 50 MG PO TABS: 50.0000 mg | ORAL_TABLET | Freq: Four times a day (QID) | ORAL | 0 refills | Status: DC | PRN

## 2017-06-08 MED ORDER — AMLODIPINE BESYLATE 10 MG PO TABS: 10.0000 mg | ORAL_TABLET | Freq: Every day | ORAL | 1 refills | Status: DC

## 2017-06-08 MED ORDER — TESTOSTERONE CYPIONATE 200 MG/ML IM SOLN
200.0000 mg | Freq: Once | INTRAMUSCULAR | Status: AC
  Administered 2017-06-08: 200 mg via INTRAMUSCULAR

## 2017-06-08 NOTE — Progress Notes (Signed)
   Subjective:    Patient ID: Richard Beltran, male    DOB: [birth year], 52 y.o.   MRN: [id number]  HPI  VISENTE KIRKER is here for a testosterone injection. Denies chest pain, shortness of breath, headaches or mood changes.   Hypertension - First check of blood pressure was slightly elevated. He states he is taking his medication daily.    Review of Systems     Objective:   Physical Exam        Assessment & Plan:  Patient tolerated injection well without complications. Patient advised to schedule next injection 14 days from today.   Hypertension - Second check of blood pressure was within normal range. Patient advised to continue medications and to follow up as needed.

## 2017-06-21 ENCOUNTER — Ambulatory Visit (INDEPENDENT_AMBULATORY_CARE_PROVIDER_SITE_OTHER): Payer: Self-pay | Admitting: Sports Medicine

## 2017-06-21 VITALS — BP 135/78 | HR 64 | Temp 98.6°F | Resp 16 | Wt 281.8 lb

## 2017-06-21 DIAGNOSIS — E291 Testicular hypofunction: Secondary | ICD-10-CM

## 2017-06-21 MED ORDER — TESTOSTERONE CYPIONATE 200 MG/ML IM SOLN
200.0000 mg | Freq: Once | INTRAMUSCULAR | Status: AC
  Administered 2017-06-21: 200 mg via INTRAMUSCULAR

## 2017-06-21 NOTE — Progress Notes (Signed)
HPI: Patient is here for a testosterone injection. Last injection given 06/08/17 - 200 mg (1 mL) LUOQ. Denies chest pain, shortness of breath, headaches, mood changes and medication problems.  Assessment and Plan: Patient tolerated injection well without any complications. Injection was administered to the RUOQ. Patient was advised to schedule next injection in 14 days.

## 2017-07-04 ENCOUNTER — Other Ambulatory Visit: Payer: Self-pay | Admitting: Sports Medicine

## 2017-07-05 ENCOUNTER — Ambulatory Visit (INDEPENDENT_AMBULATORY_CARE_PROVIDER_SITE_OTHER): Payer: Self-pay | Admitting: Sports Medicine

## 2017-07-05 VITALS — BP 139/86 | HR 82 | Temp 98.5°F | Resp 16 | Wt 287.2 lb

## 2017-07-05 DIAGNOSIS — E291 Testicular hypofunction: Secondary | ICD-10-CM

## 2017-07-05 MED ORDER — TESTOSTERONE CYPIONATE 200 MG/ML IM SOLN
200.0000 mg | Freq: Once | INTRAMUSCULAR | Status: AC
  Administered 2017-07-05: 200 mg via INTRAMUSCULAR

## 2017-07-05 NOTE — Progress Notes (Signed)
HPI: Patient is here for a testosterone injection. Last injection given 06/21/17 - RUOQ. Patient denies chest pain, shortness of breath, headaches and problems with medication or mood changes.  Assessment and Plan: Administered testosterone injection  - LUOQ. Patient tolerated injection well without complications. Patient advised  To schedule next injection in 14 days.

## 2017-07-19 ENCOUNTER — Ambulatory Visit (INDEPENDENT_AMBULATORY_CARE_PROVIDER_SITE_OTHER): Payer: Self-pay | Admitting: Sports Medicine

## 2017-07-19 VITALS — BP 139/84 | HR 70 | Temp 98.1°F | Wt 284.2 lb

## 2017-07-19 DIAGNOSIS — E291 Testicular hypofunction: Secondary | ICD-10-CM

## 2017-07-19 MED ORDER — TESTOSTERONE CYPIONATE 200 MG/ML IM SOLN
200.0000 mg | Freq: Once | INTRAMUSCULAR | Status: AC
  Administered 2017-07-19: 200 mg via INTRAMUSCULAR

## 2017-07-19 NOTE — Progress Notes (Signed)
HPI. Patient is here for his testosterone injection. Patient denies chest pain, shortness of breath, headaches, medication problems or mood changes.   Assessment and Plan: Patient tolerated injection - RUOQ - well without complications. Patient advised to schedule next injection in 14 days.

## 2017-07-26 ENCOUNTER — Other Ambulatory Visit: Payer: Self-pay | Admitting: Sports Medicine

## 2017-08-02 ENCOUNTER — Ambulatory Visit (INDEPENDENT_AMBULATORY_CARE_PROVIDER_SITE_OTHER): Payer: Self-pay | Admitting: Sports Medicine

## 2017-08-02 VITALS — BP 135/79 | HR 58 | Temp 97.9°F | Wt 278.2 lb

## 2017-08-02 DIAGNOSIS — E291 Testicular hypofunction: Secondary | ICD-10-CM

## 2017-08-02 MED ORDER — TESTOSTERONE CYPIONATE 200 MG/ML IM SOLN
200.0000 mg | Freq: Once | INTRAMUSCULAR | Status: AC
  Administered 2017-08-02: 200 mg via INTRAMUSCULAR

## 2017-08-02 NOTE — Progress Notes (Signed)
Patient presented today for testosterone injection. Denies chest pain, shortness of breath, headaches and problems with medication or mood changes. No induration or bruising noted on last injection site (RUOQ). As per pt, "feeling great". Pt tolerated testosterone injection well today on LUOQ without any complications. Patient has been advised to schedule next injection in two wks. Pt informed we will call him in regards to any updates or change.

## 2017-08-16 ENCOUNTER — Ambulatory Visit (INDEPENDENT_AMBULATORY_CARE_PROVIDER_SITE_OTHER): Payer: Self-pay | Admitting: Sports Medicine

## 2017-08-16 VITALS — BP 127/73 | HR 69

## 2017-08-16 DIAGNOSIS — E291 Testicular hypofunction: Secondary | ICD-10-CM

## 2017-08-16 MED ORDER — TESTOSTERONE CYPIONATE 200 MG/ML IM SOLN
200.0000 mg | Freq: Once | INTRAMUSCULAR | Status: AC
  Administered 2017-08-16: 200 mg via INTRAMUSCULAR

## 2017-08-16 MED ORDER — LISINOPRIL-HYDROCHLOROTHIAZIDE 20-25 MG PO TABS: 1.0000 | ORAL_TABLET | Freq: Every day | ORAL | 1 refills | Status: DC

## 2017-08-16 MED ORDER — MELOXICAM 15 MG PO TABS: ORAL_TABLET | ORAL | 1 refills | Status: DC

## 2017-08-16 MED ORDER — TRAMADOL HCL 50 MG PO TABS: 50.0000 mg | ORAL_TABLET | Freq: Four times a day (QID) | ORAL | 0 refills | Status: DC | PRN

## 2017-08-16 NOTE — Progress Notes (Signed)
Patient came into office today for testosterone injection. Denies chest pain, shortness of breath, headaches and problems associated with taking this medication. Patient states he has had no abnornal mood swings. Patient tolerated injection in RUOQ well without complications. Patient advised to schedule his next injection for 2 weeks from today. Pt also requested some refills (lisionpril/HCTZ, meloxicam, and tramadol). Will pend for Provider approval.   Went into detail on the importance of ColoGuard. Pt reports he has the kit at home, but was reluctant to complete the test. Answered questions about the test, and he will go home to look at the kit. If it is expired he will call for a new kit to be delivered. He is agreeable that this is important for his health and he will complete it. No further questions.

## 2017-08-30 ENCOUNTER — Ambulatory Visit (INDEPENDENT_AMBULATORY_CARE_PROVIDER_SITE_OTHER): Payer: Self-pay | Admitting: Sports Medicine

## 2017-08-30 ENCOUNTER — Encounter: Payer: Self-pay | Admitting: Sports Medicine

## 2017-08-30 VITALS — BP 137/77 | HR 66

## 2017-08-30 DIAGNOSIS — E291 Testicular hypofunction: Secondary | ICD-10-CM

## 2017-08-30 MED ORDER — TESTOSTERONE CYPIONATE 200 MG/ML IM SOLN
200.0000 mg | Freq: Once | INTRAMUSCULAR | Status: AC
  Administered 2017-08-30: 200 mg via INTRAMUSCULAR

## 2017-08-30 NOTE — Progress Notes (Signed)
   Subjective:    Patient ID: Richard Beltran, male    DOB: 07-27-1965, 52 y.o.   MRN: [id number]  HPI  Patient came into office today for testosterone injection. Denies chest pain, shortness of breath, headaches and problems associated with taking this medication. Patient states he has had no abnornal mood swings.   Review of Systems     Objective:   Physical Exam        Assessment & Plan:   Patient tolerated injection in [place] well without complications. Patient advised to schedule his next injection for 2 weeks from today.

## 2017-09-13 ENCOUNTER — Ambulatory Visit (INDEPENDENT_AMBULATORY_CARE_PROVIDER_SITE_OTHER): Payer: Self-pay | Admitting: Sports Medicine

## 2017-09-13 VITALS — BP 146/84 | HR 84 | Resp 18 | Wt 285.0 lb

## 2017-09-13 DIAGNOSIS — E291 Testicular hypofunction: Secondary | ICD-10-CM

## 2017-09-13 MED ORDER — TESTOSTERONE CYPIONATE 200 MG/ML IM SOLN
200.0000 mg | INTRAMUSCULAR | Status: DC
  Administered 2017-09-13: 200 mg via INTRAMUSCULAR

## 2017-09-13 NOTE — Progress Notes (Signed)
   Subjective:    Patient ID: Richard Beltran, male    DOB: 1965-06-01, 52 y.o.   MRN: [id number]  HPI Patient here for testosterone injection. Denies chest pain, shortness of breath, headaches, mood changes.    Review of Systems     Objective:   Physical Exam        Assessment & Plan:  Patient tolerated injection well without complications. Patient advised to make next appointment for 14 days. Requesting refills on meloxicam and tramadol.

## 2017-09-28 ENCOUNTER — Ambulatory Visit (INDEPENDENT_AMBULATORY_CARE_PROVIDER_SITE_OTHER): Payer: Self-pay | Admitting: Sports Medicine

## 2017-09-28 VITALS — BP 135/80 | HR 61

## 2017-09-28 DIAGNOSIS — E291 Testicular hypofunction: Secondary | ICD-10-CM

## 2017-09-28 MED ORDER — TESTOSTERONE CYPIONATE 200 MG/ML IM SOLN
200.0000 mg | Freq: Once | INTRAMUSCULAR | Status: AC
  Administered 2017-09-28: 200 mg via INTRAMUSCULAR

## 2017-09-28 NOTE — Progress Notes (Signed)
Patient came into office today for testosterone injection. Denies chest pain, shortness of breath, headaches and problems associated with taking this medication. Patient states he has had no abnornal mood swings. Patient tolerated injection in LUOQ well without complications. Patient advised to schedule his next injection for 2 weeks from today. 

## 2017-10-11 ENCOUNTER — Ambulatory Visit (INDEPENDENT_AMBULATORY_CARE_PROVIDER_SITE_OTHER): Payer: Self-pay | Admitting: Sports Medicine

## 2017-10-11 VITALS — BP 152/78 | HR 80 | Wt 285.0 lb

## 2017-10-11 DIAGNOSIS — E291 Testicular hypofunction: Secondary | ICD-10-CM

## 2017-10-11 MED ORDER — TESTOSTERONE CYPIONATE 200 MG/ML IM SOLN
200.0000 mg | Freq: Once | INTRAMUSCULAR | Status: AC
  Administered 2017-10-11: 200 mg via INTRAMUSCULAR

## 2017-10-11 MED ORDER — MELOXICAM 15 MG PO TABS: ORAL_TABLET | ORAL | 1 refills | Status: DC

## 2017-10-11 MED ORDER — AMLODIPINE BESYLATE 10 MG PO TABS: 10.0000 mg | ORAL_TABLET | Freq: Every day | ORAL | 1 refills | Status: DC

## 2017-10-11 NOTE — Progress Notes (Signed)
   Subjective:    Patient ID: Richard Beltran, male    DOB: [birth year], 52 y.o.   MRN: [id number]  HPI  Richard Beltran is here for a testosterone injection. His blood pressure is a little elevated this morning. He ran out of the amlodipine and needs a refill. Denies chest pain, shortness of breath, headaches or mood changes.   Review of Systems     Objective:   Physical Exam        Assessment & Plan:  Patient tolerated injection well without complications. Patient advised to schedule next injection 14 days from today.   HTN- refilled amlodipine. Patient to take medication daily.

## 2017-10-13 ENCOUNTER — Encounter (HOSPITAL_COMMUNITY): Payer: Self-pay | Admitting: Emergency Medicine

## 2017-10-13 ENCOUNTER — Emergency Department (HOSPITAL_COMMUNITY)
Admission: EM | Admit: 2017-10-13 | Discharge: 2017-10-13 | Disposition: A | Discharge status: Home / Self Care | Payer: Self-pay | Attending: Emergency Medicine | Admitting: Emergency Medicine

## 2017-10-13 ENCOUNTER — Other Ambulatory Visit: Payer: Self-pay

## 2017-10-13 ENCOUNTER — Emergency Department (HOSPITAL_COMMUNITY): Payer: Self-pay

## 2017-10-13 DIAGNOSIS — Z79899 Other long term (current) drug therapy: Secondary | ICD-10-CM | POA: Insufficient documentation

## 2017-10-13 DIAGNOSIS — M5431 Sciatica, right side: Secondary | ICD-10-CM | POA: Insufficient documentation

## 2017-10-13 DIAGNOSIS — I10 Essential (primary) hypertension: Secondary | ICD-10-CM | POA: Insufficient documentation

## 2017-10-13 MED ORDER — OXYCODONE-ACETAMINOPHEN 5-325 MG PO TABS
1.0000 | ORAL_TABLET | Freq: Once | ORAL | Status: AC
  Administered 2017-10-13: 1 via ORAL
  Filled 2017-10-13: qty 1

## 2017-10-13 MED ORDER — PREDNISONE 20 MG PO TABS: ORAL_TABLET | ORAL | 0 refills | Status: DC

## 2017-10-13 MED ORDER — PREDNISONE 20 MG PO TABS
60.0000 mg | ORAL_TABLET | Freq: Once | ORAL | Status: AC
  Administered 2017-10-13: 60 mg via ORAL
  Filled 2017-10-13: qty 3

## 2017-10-13 MED ORDER — OXYCODONE-ACETAMINOPHEN 5-325 MG PO TABS: 1.0000 | ORAL_TABLET | ORAL | 0 refills | Status: DC | PRN

## 2017-10-13 NOTE — ED Notes (Signed)
Pt back from x-ray.

## 2017-10-13 NOTE — ED Triage Notes (Signed)
C/o low back pain onset 2 days ago and is progressively worsening denies recent event or injury but admits to Hx of spinal fusion 5 yrs ago.

## 2017-10-13 NOTE — Discharge Instructions (Signed)
Take the prescribed medication as directed.  You can start the steroids tomorrow since you already had today's dose.  Can take motrin between doses of pain medications.  Try to limit excess tylenol. Follow-up with your primary care doctor. You can also follow-up with Dr. Ronnald Ramp in clinic if needed-- contact info provided. Return to the ED for new or worsening symptoms.

## 2017-10-13 NOTE — ED Provider Notes (Signed)
[facility] DEPT Provider Note   CSN: [id number] Arrival date & time: 10/13/17  0101     History   Chief Complaint Chief Complaint  Patient presents with  . Back Pain    HPI Richard Beltran is a 52 y.o. male.  The history is provided by the patient and medical records.  Back Pain      52 year old male with history of hypertension, hypogonadism, degenerative disc disease status post L4-L5 fusion in 2012 with Dr. Ronnald Ramp, presenting to the ED with low back pain.  Patient reports over the past 2 days he has had some low back pain.  States it has been progressively worsening, reports pain radiating down his right leg.  No injury, trauma, or falls.  Denies any heavy lifting, pushing, pulling, etc.  States at times his right foot feels like it is "asleep".  He denies any focal numbness or weakness of his legs.  Pain is worse with changing position, specifically moving his legs.  He denies any bowel or bladder incontinence.  No fever, chills.  He has no history of IV drug use or cancer.  He has not had any ongoing back issues since his surgery.  Has taken OTC meds without relief.  The leg really screaming at me like everybody was mostly  Past Medical History:  Diagnosis Date  . Hypertension   . Lumbar degenerative disc disease 10/29/2013   Post L4-L5 fusion.   . Male hypogonadism     Patient Active Problem List   Diagnosis Date Noted  . Insomnia 07/29/2015  . Obesity 11/18/2014  . Obstructive sleep apnea 10/22/2014  . Male hypogonadism 11/06/2013  . Hypertension, essential, benign 10/29/2013  . Lumbar degenerative disc disease 10/29/2013  . Pulmonary nodule 10/29/2013  . Annual physical exam 10/29/2013  . 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 BACK SURGERY          Home Medications    Prior to Admission medications   Medication Sig Start Date End Date Taking? Authorizing  Provider  amLODipine (NORVASC) 10 MG tablet Take 1 tablet (10 mg total) by mouth daily. 10/11/17  Yes Silverio Decamp, MD  carvedilol (COREG) 25 MG tablet Take 1 tablet (25 mg total) by mouth 2 (two) times daily with a meal. 05/13/17  Yes Silverio Decamp, MD  lisinopril-hydrochlorothiazide (PRINZIDE,ZESTORETIC) 20-25 MG tablet Take 1 tablet by mouth daily. 08/16/17  Yes Silverio Decamp, MD  meloxicam (MOBIC) 15 MG tablet TAKE 1 TABLET BY MOUTH DAILY AS NEEDED Patient taking differently: Take 30 mg by mouth daily.  10/11/17  Yes Silverio Decamp, MD  testosterone cypionate (DEPOTESTOSTERONE CYPIONATE) 200 MG/ML injection Inject 1 mL (200 mg total) into the muscle every 14 (fourteen) days.   Yes Silverio Decamp, MD  traMADol (ULTRAM) 50 MG tablet Take 1-2 tablets (50-100 mg total) by mouth every 6 (six) hours as needed. Patient taking differently: Take 50-100 mg by mouth every 6 (six) hours as needed for moderate pain.  08/16/17  Yes Silverio Decamp, MD    Family History Family History  Problem Relation Age of Onset  . Diabetes Mother   . Hyperlipidemia Mother   . Healthy Father   . Healthy Maternal Grandmother   . Healthy Maternal Grandfather   . Healthy Paternal Grandmother   . Healthy Paternal Grandfather     Social History Social History   Tobacco Use  . Smoking  status: Never Smoker  . Smokeless tobacco: Never Used  Substance Use Topics  . Alcohol use: No  . Drug use: No     Allergies   Eggs or egg-derived products   Review of Systems Review of Systems  Musculoskeletal: Positive for back pain.  All other systems reviewed and are negative.    Physical Exam Updated Vital Signs BP (!) 148/81 (BP Location: Left Arm)   Pulse 72   Temp 97.9 F (36.6 C) (Oral)   Resp (!) 22   SpO2 96%   Physical Exam  Constitutional: He is oriented to person, place, and time. He appears well-developed and well-nourished.  HENT:  Head:  Normocephalic and atraumatic.  Mouth/Throat: Oropharynx is clear and moist.  Eyes: Pupils are equal, round, and reactive to light. Conjunctivae and EOM are normal.  Neck: Normal range of motion.  Cardiovascular: Normal rate, regular rhythm and normal heart sounds.  Pulmonary/Chest: Effort normal and breath sounds normal. No stridor. No respiratory distress.  Abdominal: Soft. Bowel sounds are normal.  Musculoskeletal: Normal range of motion.  LS appears normal without noted deformity or step-off; + SLR on right; normal strength/sensation of both legs; feet are warm and well perfused, pulses intact  Neurological: He is alert and oriented to person, place, and time.  Skin: Skin is warm and dry.  Psychiatric: He has a normal mood and affect.  Nursing note and vitals reviewed.    ED Treatments / Results  Labs (all labs ordered are listed, but only abnormal results are displayed) Labs Reviewed - No data to display  EKG None  Radiology Dg Lumbar Spine Complete  Result Date: 10/13/2017 CLINICAL DATA:  Low back pain EXAM: LUMBAR SPINE - COMPLETE 4+ VIEW COMPARISON:  Fluoroscopy 08/27/2015 Lumbar spine MRI 11/07/2013 FINDINGS: L4-L5 PLIF. Alignment is normal. No hardware abnormality. No fracture of the lumbar spine. Intervertebral disc spaces are preserved. Facet articulations are normal. IMPRESSION: L4-L5 PLIF without adverse features. No acute abnormality of the lumbar spine. Electronically Signed   By: Ulyses Jarred M.D.   On: 10/13/2017 05:54    Procedures Procedures (including critical care time)  Medications Ordered in ED Medications  oxyCODONE-acetaminophen (PERCOCET/ROXICET) 5-325 MG per tablet 1 tablet (1 tablet Oral Given 10/13/17 0454)  predniSONE (DELTASONE) tablet 60 mg (60 mg Oral Given 10/13/17 0454)     Initial Impression / Assessment and Plan / ED Course  I have reviewed the triage vital signs and the nursing notes.  Pertinent labs & imaging results that were available  during my care of the patient were reviewed by me and considered in my medical decision making (see chart for details).  52 year old male here with low back pain over the past 2 days.  No injury, trauma, or falls.  He is afebrile and nontoxic.  Right lower back pain with radiation down the right leg.  Positive straight leg raise on right.  Both legs are neurovascularly intact.  Normal gait.  He does not have any focal neurologic deficits concerning for cauda equina.  No red flag symptoms such as fever, history of IV drug use, or cancer.  Did have spinal fusion surgery several years ago with Dr. Ronnald Ramp.  Will obtain screening x-ray.  Medications ordered.  X-ray without acute findings.  Patient feeling better after medications here.  Suspect symptoms related to lumbar radiculopathy/sciatica.  Will continue steroid taper, short supply pain meds at home.  Close follow-up with PCP.  Can also follow-up with neurosurgeon if any ongoing issues.  Discussed  plan with patient, he acknowledged understanding and agreed with plan of care.  Return precautions given for new or worsening symptoms.  Final Clinical Impressions(s) / ED Diagnoses   Final diagnoses:  Sciatica, right side    ED Discharge Orders        Ordered    oxyCODONE-acetaminophen (PERCOCET) 5-325 MG tablet  Every 4 hours PRN     10/13/17 0616    predniSONE (DELTASONE) 20 MG tablet     10/13/17 0616       Larene Pickett, PA-C 93/79/02 [id number]    Delora Fuel, MD 35/32/99 [phone]

## 2017-10-13 NOTE — ED Notes (Signed)
Patient transported to X-ray 

## 2017-10-17 ENCOUNTER — Other Ambulatory Visit: Payer: Self-pay | Admitting: Sports Medicine

## 2017-10-25 ENCOUNTER — Ambulatory Visit: Payer: Self-pay

## 2017-10-26 ENCOUNTER — Ambulatory Visit: Payer: Self-pay

## 2017-10-31 ENCOUNTER — Ambulatory Visit: Payer: Self-pay

## 2017-11-08 ENCOUNTER — Ambulatory Visit (INDEPENDENT_AMBULATORY_CARE_PROVIDER_SITE_OTHER): Payer: Self-pay | Admitting: Sports Medicine

## 2017-11-08 VITALS — BP 147/74 | HR 61 | Temp 98.1°F | Wt 285.0 lb

## 2017-11-08 DIAGNOSIS — E291 Testicular hypofunction: Secondary | ICD-10-CM

## 2017-11-08 MED ORDER — TESTOSTERONE CYPIONATE 200 MG/ML IM SOLN
200.0000 mg | Freq: Once | INTRAMUSCULAR | Status: AC
  Administered 2017-11-08: 200 mg via INTRAMUSCULAR

## 2017-11-08 NOTE — Patient Instructions (Signed)
Patient tolerated injection well without complications. Patient advised to schedule next injection 14 days from today.

## 2017-11-08 NOTE — Progress Notes (Signed)
Patient presents to clinic for Testosterone injection in [place]. Patient denies any chest pain, shortness of breath, dizziness, headaches or mood changes. Patient has been taking his BP medication daily as prescribed by PCP.

## 2017-11-14 ENCOUNTER — Other Ambulatory Visit: Payer: Self-pay | Admitting: Sports Medicine

## 2017-11-15 ENCOUNTER — Other Ambulatory Visit: Payer: Self-pay | Admitting: Sports Medicine

## 2017-11-21 ENCOUNTER — Ambulatory Visit (INDEPENDENT_AMBULATORY_CARE_PROVIDER_SITE_OTHER): Payer: Self-pay | Admitting: Sports Medicine

## 2017-11-21 VITALS — BP 125/71 | HR 72 | Wt 280.0 lb

## 2017-11-21 DIAGNOSIS — E291 Testicular hypofunction: Secondary | ICD-10-CM

## 2017-11-21 MED ORDER — TESTOSTERONE CYPIONATE 200 MG/ML IM SOLN
200.0000 mg | Freq: Once | INTRAMUSCULAR | Status: AC
  Administered 2017-11-21: 200 mg via INTRAMUSCULAR

## 2017-11-21 NOTE — Progress Notes (Signed)
   Subjective:    Patient ID: Richard Beltran, male    DOB: [birth year], 52 y.o.   MRN: [id number]  HPI  QUANTA ROHER is here for a testosterone injection. Denies chest pain, shortness of breath, headaches or mood changes.    Review of Systems     Objective:   Physical Exam        Assessment & Plan:  Patient tolerated injection well without complications. Patient advised to schedule next injection 14 days from today.

## 2017-12-05 ENCOUNTER — Other Ambulatory Visit: Payer: Self-pay

## 2017-12-05 ENCOUNTER — Ambulatory Visit (INDEPENDENT_AMBULATORY_CARE_PROVIDER_SITE_OTHER): Payer: Self-pay | Admitting: Sports Medicine

## 2017-12-05 VITALS — BP 138/73 | HR 64 | Temp 98.5°F | Wt 283.0 lb

## 2017-12-05 DIAGNOSIS — E291 Testicular hypofunction: Secondary | ICD-10-CM

## 2017-12-05 MED ORDER — TESTOSTERONE CYPIONATE 200 MG/ML IM SOLN
200.0000 mg | Freq: Once | INTRAMUSCULAR | Status: AC
  Administered 2017-12-05: 200 mg via INTRAMUSCULAR

## 2017-12-05 MED ORDER — MELOXICAM 15 MG PO TABS: 15.0000 mg | ORAL_TABLET | Freq: Every day | ORAL | 1 refills | Status: DC | PRN

## 2017-12-05 NOTE — Progress Notes (Signed)
   Subjective:    Patient ID: Richard Beltran, male    DOB: May 20, 1965, 52 y.o.   MRN: [id number]  HPI Patient is here for a testosterone injection. Denies chest pain, shortness of breath, headaches, problems with mood change, or medication problems.   Review of Systems     Objective:   Physical Exam        Assessment & Plan:  Patient tolerated injection in [place] well without complications. Patient advised to schedule his next injection for 2 weeks from today.  Pt's BP was elevated on first check at 142/71  And after five minutes of rest it came down to 138/73.

## 2017-12-05 NOTE — Telephone Encounter (Signed)
Pt requesting RF on Percocet, Meloxicam, and Tramadol be sent to [facility].   RXs pended, please review and send if appropriate.  Thanks!

## 2017-12-05 NOTE — Telephone Encounter (Signed)
Pt advised. Scheduled for follow up appt. On 12-08-17 for med refills

## 2017-12-08 ENCOUNTER — Encounter: Payer: Self-pay | Admitting: Sports Medicine

## 2017-12-08 ENCOUNTER — Ambulatory Visit (INDEPENDENT_AMBULATORY_CARE_PROVIDER_SITE_OTHER): Payer: Self-pay | Admitting: Sports Medicine

## 2017-12-08 DIAGNOSIS — M51369 Other intervertebral disc degeneration, lumbar region without mention of lumbar back pain or lower extremity pain: Secondary | ICD-10-CM

## 2017-12-08 DIAGNOSIS — I10 Essential (primary) hypertension: Secondary | ICD-10-CM

## 2017-12-08 DIAGNOSIS — M5136 Other intervertebral disc degeneration, lumbar region: Secondary | ICD-10-CM

## 2017-12-08 MED ORDER — TRAMADOL HCL 50 MG PO TABS: 100.0000 mg | ORAL_TABLET | Freq: Three times a day (TID) | ORAL | 0 refills | Status: DC | PRN

## 2017-12-08 MED ORDER — PREDNISONE 50 MG PO TABS
ORAL_TABLET | ORAL | 0 refills | Status: DC
Start: 2017-12-08 — End: 2017-12-19

## 2017-12-08 NOTE — Assessment & Plan Note (Signed)
Adding routine labs.  He will return fasting.

## 2017-12-08 NOTE — Assessment & Plan Note (Signed)
Right L5 distribution radiculopathy. Pedicle screw does appear close to the L5 nerve root on MRI from 2015. Having recurrence of pain, adding 5 days of prednisone, refilling the tramadol for use up to 6 times per day. Ordering a new MRI with and without contrast with a close eye paid on the course of the right L5 nerve root. He may need a right transforaminal L5-S1 epidural.

## 2017-12-08 NOTE — Progress Notes (Signed)
Subjective:    CC: Low back pain  HPI: This is a pleasant 52 year old male post L4-L5 fusion, history of lumbar degenerative disc disease, he is done well in the past with lumbar epidurals.  Recently has had a recurrence of pain, axial, discogenic with radiation down the right leg in an L5 distribution, no bowel or bladder dysfunction, saddle numbness, constitutional symptoms.  Preventive measures: Due for some routine labs.  Hypertension: Blood pressure is elevated, no headaches, visual changes, chest pain.  I reviewed the past medical history, family history, social history, surgical history, and allergies today and no changes were needed.  Please see the problem list section below in epic for further details.  Past Medical History: Past Medical History:  Diagnosis Date  . Hypertension   . Lumbar degenerative disc disease 10/29/2013   Post L4-L5 fusion.   . Male hypogonadism    Past Surgical History: Past Surgical History:  Procedure Laterality Date  . BACK SURGERY     Social History: Social History   Socioeconomic History  . Marital status: Married    Spouse name: Not on file  . Number of children: 4  . Years of education: 18+  . Highest education level: Not on file  Occupational History  . Occupation: Database administrator  Social Needs  . Financial resource strain: Not on file  . Food insecurity:    Worry: Not on file    Inability: Not on file  . Transportation needs:    Medical: Not on file    Non-medical: Not on file  Tobacco Use  . Smoking status: Never Smoker  . Smokeless tobacco: Never Used  Substance and Sexual Activity  . Alcohol use: No  . Drug use: No  . Sexual activity: Not on file  Lifestyle  . Physical activity:    Days per week: Not on file    Minutes per session: Not on file  . Stress: Not on file  Relationships  . Social connections:    Talks on phone: Not on file    Gets together: Not on file    Attends religious service: Not on file    Active  member of club or organization: Not on file    Attends meetings of clubs or organizations: Not on file    Relationship status: Not on file  Other Topics Concern  . Not on file  Social History Narrative   Fun: Golf and read   Denies religious beliefs effecting healthcare.    Family History: Family History  Problem Relation Age of Onset  . Diabetes Mother   . Hyperlipidemia Mother   . Healthy Father   . Healthy Maternal Grandmother   . Healthy Maternal Grandfather   . Healthy Paternal Grandmother   . Healthy Paternal Grandfather    Allergies: Allergies  Allergen Reactions  . Eggs Or Egg-Derived Products Nausea And Vomiting   Medications: See med rec.  Review of Systems: No fevers, chills, night sweats, weight loss, chest pain, or shortness of breath.   Objective:    General: Well Developed, well nourished, and in no acute distress.  Neuro: Alert and oriented x3, extra-ocular muscles intact, sensation grossly intact.  HEENT: Normocephalic, atraumatic, pupils equal round reactive to light, neck supple, no masses, no lymphadenopathy, thyroid nonpalpable.  Skin: Warm and dry, no rashes. Cardiac: Regular rate and rhythm, no murmurs rubs or gallops, no lower extremity edema.  Respiratory: Clear to auscultation bilaterally. Not using accessory muscles, speaking in full sentences.  I did review his  MRI from 2015, his right L5 nerve root does come pretty close to the pedicle screw.  Impression and Recommendations:    Lumbar degenerative disc disease Right L5 distribution radiculopathy. Pedicle screw does appear close to the L5 nerve root on MRI from 2015. Having recurrence of pain, adding 5 days of prednisone, refilling the tramadol for use up to 6 times per day. Ordering a new MRI with and without contrast with a close eye paid on the course of the right L5 nerve root. He may need a right transforaminal L5-S1 epidural.   Hypertension, essential, benign Adding routine labs.   He will return fasting.  ___________________________________________ Gwen Her. Dianah Field, M.D., ABFM., CAQSM. Primary Care and [place] In[employer] of [place] [employer] [facility]

## 2017-12-19 ENCOUNTER — Ambulatory Visit (INDEPENDENT_AMBULATORY_CARE_PROVIDER_SITE_OTHER): Payer: Self-pay | Admitting: Sports Medicine

## 2017-12-19 VITALS — BP 133/67 | HR 55 | Ht 73.0 in | Wt 281.0 lb

## 2017-12-19 DIAGNOSIS — E291 Testicular hypofunction: Secondary | ICD-10-CM

## 2017-12-19 MED ORDER — TESTOSTERONE CYPIONATE 200 MG/ML IM SOLN
200.0000 mg | Freq: Once | INTRAMUSCULAR | Status: AC
  Administered 2017-12-19: 200 mg via INTRAMUSCULAR

## 2017-12-19 NOTE — Progress Notes (Signed)
Patient presents to clinic for Testosterone injection. Patient denies any chest pain, shortness of breath and change in mood. Patient tolerated injection in [place] well. No immediate complications. Patient denied flu shot due to egg allergy. HM updated.

## 2017-12-20 LAB — COMPREHENSIVE METABOLIC PANEL
AG Ratio: 1.6 (calc) (ref 1.0–2.5)
AST: 15 U/L (ref 10–35)
Albumin: 4.2 g/dL (ref 3.6–5.1)
Alkaline phosphatase (APISO): 56 U/L (ref 40–115)
CO2: 30 mmol/L (ref 20–32)
Calcium: 9.6 mg/dL (ref 8.6–10.3)
Chloride: 103 mmol/L (ref 98–110)
Globulin: 2.7 g/dL (calc) (ref 1.9–3.7)
Glucose, Bld: 104 mg/dL — ABNORMAL HIGH (ref 65–99)
Potassium: 4.2 mmol/L (ref 3.5–5.3)
Total Bilirubin: 0.7 mg/dL (ref 0.2–1.2)

## 2017-12-20 LAB — COMPREHENSIVE METABOLIC PANEL WITH GFR
ALT: 21 U/L (ref 9–46)
BUN: 14 mg/dL (ref 7–25)
Creat: 1.31 mg/dL (ref 0.70–1.33)
Sodium: 139 mmol/L (ref 135–146)
Total Protein: 6.9 g/dL (ref 6.1–8.1)

## 2017-12-20 LAB — LIPID PANEL W/REFLEX DIRECT LDL
Cholesterol: 191 mg/dL (ref <200)
HDL: 38 mg/dL — ABNORMAL LOW (ref >40)
LDL Cholesterol (Calc): 131 mg/dL — ABNORMAL HIGH
Non-HDL Cholesterol (Calc): 153 mg/dL (calc) — ABNORMAL HIGH (ref <130)
Total CHOL/HDL Ratio: 5 (calc) — ABNORMAL HIGH (ref <5.0)
Triglycerides: 109 mg/dL (ref <150)

## 2017-12-20 LAB — CBC
HCT: 44.2 % (ref 38.5–50.0)
Hemoglobin: 14.4 g/dL (ref 13.2–17.1)
MCH: 29.1 pg (ref 27.0–33.0)
MCHC: 32.6 g/dL (ref 32.0–36.0)
MCV: 89.3 fL (ref 80.0–100.0)
MPV: 9.8 fL (ref 7.5–12.5)
Platelets: 254 Thousand/uL (ref 140–400)
RBC: 4.95 10*6/uL (ref 4.20–5.80)
RDW: 12.8 % (ref 11.0–15.0)
WBC: 6.3 Thousand/uL (ref 3.8–10.8)

## 2017-12-20 LAB — HEMOGLOBIN A1C
Hgb A1c MFr Bld: 5.2 % of total Hgb (ref <5.7)
Mean Plasma Glucose: 103 (calc)
eAG (mmol/L): 5.7 (calc)

## 2017-12-20 LAB — TSH: TSH: 1.11 m[IU]/L (ref 0.40–4.50)

## 2018-01-04 ENCOUNTER — Ambulatory Visit (INDEPENDENT_AMBULATORY_CARE_PROVIDER_SITE_OTHER): Payer: Self-pay | Admitting: Sports Medicine

## 2018-01-04 VITALS — BP 136/74 | HR 73 | Wt 282.0 lb

## 2018-01-04 DIAGNOSIS — E291 Testicular hypofunction: Secondary | ICD-10-CM

## 2018-01-04 MED ORDER — TESTOSTERONE CYPIONATE 200 MG/ML IM SOLN
200.0000 mg | Freq: Once | INTRAMUSCULAR | Status: AC
Start: 2018-01-04 — End: 2018-01-04
  Administered 2018-01-04: 200 mg via INTRAMUSCULAR

## 2018-01-04 NOTE — Progress Notes (Signed)
   Subjective:    Patient ID: Richard Beltran, male    DOB: 11-Sep-1965, 52 y.o.   MRN: [id number]  HPI  TRAMEL WESTBROOK is here for a testosterone injection. Denies chest pain, shortness of breath, headaches or mood changes.   Review of Systems     Objective:   Physical Exam        Assessment & Plan:  Patient tolerated injection well without complications. Patient advised to schedule next injection 14 days from today.

## 2018-01-09 ENCOUNTER — Other Ambulatory Visit: Payer: Self-pay | Admitting: Sports Medicine

## 2018-01-09 DIAGNOSIS — M5136 Other intervertebral disc degeneration, lumbar region: Secondary | ICD-10-CM

## 2018-01-16 ENCOUNTER — Ambulatory Visit (INDEPENDENT_AMBULATORY_CARE_PROVIDER_SITE_OTHER): Payer: Self-pay | Admitting: Sports Medicine

## 2018-01-16 VITALS — BP 124/73 | HR 65 | Temp 98.1°F | Wt 278.0 lb

## 2018-01-16 DIAGNOSIS — E291 Testicular hypofunction: Secondary | ICD-10-CM

## 2018-01-16 MED ORDER — TESTOSTERONE CYPIONATE 200 MG/ML IM SOLN
200.0000 mg | Freq: Once | INTRAMUSCULAR | Status: AC
  Administered 2018-01-16: 200 mg via INTRAMUSCULAR

## 2018-01-16 NOTE — Progress Notes (Signed)
   Subjective:    Patient ID: Richard Beltran, male    DOB: 08/29/1965, 52 y.o.   MRN: [id number]  HPI Patient is here for a testosterone injection. Denies chest pain, shortness of breath, headaches, problems with mood change, or medication problems.    Review of Systems     Objective:   Physical Exam        Assessment & Plan:  Patient tolerated injection in [place] well without complications. Patient advised to schedule his next injection for 2 weeks from today.  

## 2018-01-30 ENCOUNTER — Ambulatory Visit (INDEPENDENT_AMBULATORY_CARE_PROVIDER_SITE_OTHER): Payer: Self-pay | Admitting: Sports Medicine

## 2018-01-30 VITALS — BP 150/75 | HR 66 | Wt 283.0 lb

## 2018-01-30 DIAGNOSIS — E291 Testicular hypofunction: Secondary | ICD-10-CM

## 2018-01-30 MED ORDER — TESTOSTERONE CYPIONATE 200 MG/ML IM SOLN
200.0000 mg | Freq: Once | INTRAMUSCULAR | Status: AC
  Administered 2018-01-30: 200 mg via INTRAMUSCULAR

## 2018-01-30 NOTE — Progress Notes (Signed)
Patient came into office today for testosterone injection. Denies chest pain, shortness of breath, headaches and problems associated with taking this medication. Patient states he has had no abnornal mood swings. Patient tolerated injection in [place] well without complications. Patient advised to schedule his next injection for 2 weeks from today.  Pt's BP was elevated. He just got home from vacation where his diet was not the best. Per PCP, Pt to have a low sodium diet for the next 2 weeks. If BP still high then, they will discuss Rx's. Pt verbalized understanding.

## 2018-02-13 ENCOUNTER — Ambulatory Visit (INDEPENDENT_AMBULATORY_CARE_PROVIDER_SITE_OTHER): Payer: Self-pay | Admitting: Sports Medicine

## 2018-02-13 VITALS — BP 130/73 | HR 75 | Ht 73.0 in | Wt 283.0 lb

## 2018-02-13 DIAGNOSIS — E291 Testicular hypofunction: Secondary | ICD-10-CM

## 2018-02-13 MED ORDER — TESTOSTERONE CYPIONATE 200 MG/ML IM SOLN
200.0000 mg | Freq: Once | INTRAMUSCULAR | Status: AC
  Administered 2018-02-13: 200 mg via INTRAMUSCULAR

## 2018-02-13 NOTE — Progress Notes (Signed)
Patient presents to clinic today for testosterone injection. Patient denies chest pain, shortness of breath, headaches, and problems associated with taking his medication. Patient also states he has had no abnormal mood swings. Testosterone injection given without complications in the [place].   At last visit patient was advised by PCP to begin a low sodium diet due to high blood pressure readings. Patient states he has been doing well on the low sodium diet and is still taking Lisinopril-hydrochlorothiazide 20-25mg . Blood pressure today was 130/73. Pulse was 75. Patient was advised to schedule his next testosterone injection for 2 weeks from today.

## 2018-02-25 ENCOUNTER — Other Ambulatory Visit: Payer: Self-pay | Admitting: Sports Medicine

## 2018-02-25 DIAGNOSIS — M5136 Other intervertebral disc degeneration, lumbar region: Secondary | ICD-10-CM

## 2018-02-27 ENCOUNTER — Ambulatory Visit (INDEPENDENT_AMBULATORY_CARE_PROVIDER_SITE_OTHER): Payer: Self-pay | Admitting: Sports Medicine

## 2018-02-27 VITALS — BP 127/71 | HR 71 | Temp 97.5°F | Wt 285.0 lb

## 2018-02-27 DIAGNOSIS — E291 Testicular hypofunction: Secondary | ICD-10-CM

## 2018-02-27 MED ORDER — TESTOSTERONE CYPIONATE 200 MG/ML IM SOLN
200.0000 mg | Freq: Once | INTRAMUSCULAR | Status: AC
  Administered 2018-02-27: 200 mg via INTRAMUSCULAR

## 2018-02-27 NOTE — Progress Notes (Signed)
   Subjective:    Patient ID: Richard Beltran, male    DOB: Jan 14, 1966, 52 y.o.   MRN: [id number]  HPI Patient is here for a testosterone injection. Denies chest pain, shortness of breath, headaches, problems with mood change, or medication problems.    Review of Systems     Objective:   Physical Exam        Assessment & Plan:  Patient tolerated injection in [place] well without complications. Patient advised to schedule his next injection for 2 weeks from today.

## 2018-03-13 ENCOUNTER — Ambulatory Visit (INDEPENDENT_AMBULATORY_CARE_PROVIDER_SITE_OTHER): Payer: Self-pay | Admitting: Sports Medicine

## 2018-03-13 VITALS — BP 138/71 | HR 98 | Temp 98.4°F | Wt 285.0 lb

## 2018-03-13 DIAGNOSIS — E291 Testicular hypofunction: Secondary | ICD-10-CM

## 2018-03-13 MED ORDER — TESTOSTERONE CYPIONATE 200 MG/ML IM SOLN
200.0000 mg | Freq: Once | INTRAMUSCULAR | Status: AC
  Administered 2018-03-13: 200 mg via INTRAMUSCULAR

## 2018-03-13 NOTE — Progress Notes (Signed)
Pt in today for testosterone injection.Pt reports no side effects from injections. Vitals obtained. 200 mg testosterone given in [place]. Pt tolerated injection well without complications. Pt instructed to return to clinic in 2 weeks.

## 2018-03-27 ENCOUNTER — Ambulatory Visit (INDEPENDENT_AMBULATORY_CARE_PROVIDER_SITE_OTHER): Payer: Self-pay | Admitting: Sports Medicine

## 2018-03-27 VITALS — BP 137/84 | HR 78

## 2018-03-27 DIAGNOSIS — E291 Testicular hypofunction: Secondary | ICD-10-CM

## 2018-03-27 MED ORDER — TESTOSTERONE CYPIONATE 200 MG/ML IM SOLN
200.0000 mg | Freq: Once | INTRAMUSCULAR | Status: AC
  Administered 2018-03-27: 200 mg via INTRAMUSCULAR

## 2018-03-27 NOTE — Progress Notes (Signed)
Established Patient Office Visit  Subjective:  Patient ID: Richard Beltran, male    DOB: June 21, 1965  Age: 52 y.o. MRN: [id number]  CC:  Chief Complaint  Patient presents with  . Hypogonadism    HPI Richard Beltran is here for a testosterone injection. Denies chest pain, shortness of breath, headaches or mood changes.   Past Medical History:  Diagnosis Date  . Hypertension   . Lumbar degenerative disc disease 10/29/2013   Post L4-L5 fusion.   . Male hypogonadism     Past Surgical History:  Procedure Laterality Date  . BACK SURGERY      Family History  Problem Relation Age of Onset  . Diabetes Mother   . Hyperlipidemia Mother   . Healthy Father   . Healthy Maternal Grandmother   . Healthy Maternal Grandfather   . Healthy Paternal Grandmother   . Healthy Paternal Grandfather     Social History   Socioeconomic History  . Marital status: Married    Spouse name: Not on file  . Number of children: 4  . Years of education: 18+  . Highest education level: Not on file  Occupational History  . Occupation: Database administrator  Social Needs  . Financial resource strain: Not on file  . Food insecurity:    Worry: Not on file    Inability: Not on file  . Transportation needs:    Medical: Not on file    Non-medical: Not on file  Tobacco Use  . Smoking status: Never Smoker  . Smokeless tobacco: Never Used  Substance and Sexual Activity  . Alcohol use: No  . Drug use: No  . Sexual activity: Not on file  Lifestyle  . Physical activity:    Days per week: Not on file    Minutes per session: Not on file  . Stress: Not on file  Relationships  . Social connections:    Talks on phone: Not on file    Gets together: Not on file    Attends religious service: Not on file    Active member of club or organization: Not on file    Attends meetings of clubs or organizations: Not on file    Relationship status: Not on file  . Intimate partner violence:    Fear of current or ex  partner: Not on file    Emotionally abused: Not on file    Physically abused: Not on file    Forced sexual activity: Not on file  Other Topics Concern  . Not on file  Social History Narrative   Fun: Golf and read   Denies religious beliefs effecting healthcare.     Outpatient Medications Prior to Visit  Medication Sig Dispense Refill  . amLODipine (NORVASC) 10 MG tablet Take 1 tablet (10 mg total) by mouth daily. 90 tablet 1  . carvedilol (COREG) 25 MG tablet Take 1 tablet (25 mg total) by mouth 2 (two) times daily with a meal. Due for follow up with PCP 180 tablet 0  . lisinopril-hydrochlorothiazide (PRINZIDE,ZESTORETIC) 20-25 MG tablet Take 1 tablet by mouth daily. 90 tablet 1  . meloxicam (MOBIC) 15 MG tablet Take 1 tablet (15 mg total) by mouth daily as needed for pain. 90 tablet 1  . traMADol (ULTRAM) 50 MG tablet TAKE 2 TABLETS BY MOUTH 3 TIMES A DAY AS NEEDED 42 tablet 4   No facility-administered medications prior to visit.     Allergies  Allergen Reactions  . Eggs Or Egg-Derived Products Nausea And  Vomiting    ROS Review of Systems    Objective:    Physical Exam  BP 137/84   Pulse 78   SpO2 97%  Wt Readings from Last 3 Encounters:  03/13/18 285 lb (129.3 kg)  02/27/18 285 lb (129.3 kg)  02/13/18 283 lb (128.4 kg)     Health Maintenance Due  Topic Date Due  . COLONOSCOPY  11/11/2015    There are no preventive care reminders to display for this patient.  Lab Results  Component Value Date   TSH 1.11 12/19/2017   Lab Results  Component Value Date   WBC 6.3 12/19/2017   HGB 14.4 12/19/2017   HCT 44.2 12/19/2017   MCV 89.3 12/19/2017   PLT 254 12/19/2017   Lab Results  Component Value Date   NA 139 12/19/2017   K 4.2 12/19/2017   CO2 30 12/19/2017   GLUCOSE 104 (H) 12/19/2017   BUN 14 12/19/2017   CREATININE 1.31 12/19/2017   BILITOT 0.7 12/19/2017   ALKPHOS 76 12/08/2016   AST 15 12/19/2017   ALT 21 12/19/2017   PROT 6.9 12/19/2017    ALBUMIN 4.2 12/08/2016   CALCIUM 9.6 12/19/2017   Lab Results  Component Value Date   CHOL 191 12/19/2017   Lab Results  Component Value Date   HDL 38 (L) 12/19/2017   Lab Results  Component Value Date   LDLCALC 131 (H) 12/19/2017   Lab Results  Component Value Date   TRIG 109 12/19/2017   Lab Results  Component Value Date   CHOLHDL 5.0 (H) 12/19/2017   Lab Results  Component Value Date   HGBA1C 5.2 12/19/2017      Assessment & Plan:  Hypogonadism - Patient tolerated injection well without complications. Patient advised to schedule next injection 14 days from today.   Problem List Items Addressed This Visit    Male hypogonadism - Primary   Relevant Medications   testosterone cypionate (DEPOTESTOSTERONE CYPIONATE) injection 200 mg (Completed) (Start on 03/27/2018 10:30 AM)      Meds ordered this encounter  Medications  . testosterone cypionate (DEPOTESTOSTERONE CYPIONATE) injection 200 mg    Follow-up: Return in about 2 weeks (around 04/10/2018) for testosterone injection. Durene Romans, Monico Blitz, [place]

## 2018-04-10 ENCOUNTER — Ambulatory Visit (INDEPENDENT_AMBULATORY_CARE_PROVIDER_SITE_OTHER): Payer: Self-pay | Admitting: Sports Medicine

## 2018-04-10 VITALS — BP 151/86 | HR 80 | Ht 73.0 in | Wt 288.0 lb

## 2018-04-10 DIAGNOSIS — E291 Testicular hypofunction: Secondary | ICD-10-CM

## 2018-04-10 DIAGNOSIS — I10 Essential (primary) hypertension: Secondary | ICD-10-CM

## 2018-04-10 DIAGNOSIS — M722 Plantar fascial fibromatosis: Secondary | ICD-10-CM | POA: Insufficient documentation

## 2018-04-10 DIAGNOSIS — G4733 Obstructive sleep apnea (adult) (pediatric): Secondary | ICD-10-CM

## 2018-04-10 MED ORDER — TESTOSTERONE CYPIONATE 200 MG/ML IM SOLN
200.0000 mg | Freq: Once | INTRAMUSCULAR | Status: AC
Start: 2018-04-10 — End: 2018-04-10
  Administered 2018-04-10: 200 mg via INTRAMUSCULAR

## 2018-04-10 NOTE — Assessment & Plan Note (Signed)
Severe pain, injection as above. Rehab exercises given, return for custom orthotics.

## 2018-04-10 NOTE — Progress Notes (Signed)
Subjective:    CC: Multiple issues  HPI: Fatigue: History of sleep apnea, has not been using his CPAP.  It is been multiple years.  Foot pain: Left-sided, localized over the plantar aspect of the heel, worse with the first few steps in the morning, typically walks barefoot around his hardwood floors.  Hypertension: Has been taking his medications, historically blood pressures are borderline controlled, very elevated today, no headaches, visual changes, chest pain.  I reviewed the past medical history, family history, social history, surgical history, and allergies today and no changes were needed.  Please see the problem list section below in epic for further details.  Past Medical History: Past Medical History:  Diagnosis Date  . Hypertension   . Lumbar degenerative disc disease 10/29/2013   Post L4-L5 fusion.   . Male hypogonadism    Past Surgical History: Past Surgical History:  Procedure Laterality Date  . BACK SURGERY     Social History: Social History   Socioeconomic History  . Marital status: Married    Spouse name: Not on file  . Number of children: 4  . Years of education: 18+  . Highest education level: Not on file  Occupational History  . Occupation: Database administrator  Social Needs  . Financial resource strain: Not on file  . Food insecurity:    Worry: Not on file    Inability: Not on file  . Transportation needs:    Medical: Not on file    Non-medical: Not on file  Tobacco Use  . Smoking status: Never Smoker  . Smokeless tobacco: Never Used  Substance and Sexual Activity  . Alcohol use: No  . Drug use: No  . Sexual activity: Not on file  Lifestyle  . Physical activity:    Days per week: Not on file    Minutes per session: Not on file  . Stress: Not on file  Relationships  . Social connections:    Talks on phone: Not on file    Gets together: Not on file    Attends religious service: Not on file    Active member of club or organization: Not on file   Attends meetings of clubs or organizations: Not on file    Relationship status: Not on file  Other Topics Concern  . Not on file  Social History Narrative   Fun: Golf and read   Denies religious beliefs effecting healthcare.    Family History: Family History  Problem Relation Age of Onset  . Diabetes Mother   . Hyperlipidemia Mother   . Healthy Father   . Healthy Maternal Grandmother   . Healthy Maternal Grandfather   . Healthy Paternal Grandmother   . Healthy Paternal Grandfather    Allergies: Allergies  Allergen Reactions  . Eggs Or Egg-Derived Products Nausea And Vomiting   Medications: See med rec.  Review of Systems: No fevers, chills, night sweats, weight loss, chest pain, or shortness of breath.   Objective:    General: Well Developed, well nourished, and in no acute distress.  Neuro: Alert and oriented x3, extra-ocular muscles intact, sensation grossly intact.  HEENT: Normocephalic, atraumatic, pupils equal round reactive to light, neck supple, no masses, no lymphadenopathy, thyroid nonpalpable.  Skin: Warm and dry, no rashes. Cardiac: Regular rate and rhythm, no murmurs rubs or gallops, no lower extremity edema.  Respiratory: Clear to auscultation bilaterally. Not using accessory muscles, speaking in full sentences. Left foot: No visible erythema or swelling. Range of motion is full in all directions.  Strength is 5/5 in all directions. No hallux valgus. No pes cavus or pes planus. No abnormal callus noted. No pain over the navicular prominence, or base of fifth metatarsal. Severe tenderness to palpation of the calcaneal insertion of plantar fascia. No pain at the Achilles insertion. No pain over the calcaneal bursa. No pain of the retrocalcaneal bursa. No tenderness to palpation over the tarsals, metatarsals, or phalanges. No hallux rigidus or limitus. No tenderness palpation over interphalangeal joints. No pain with compression of the metatarsal  heads. Neurovascularly intact distally.  Procedure: Real-time Ultrasound Guided Injection of left plantar fascia origin Device: GE Logiq E  Verbal informed consent obtained.  Time-out conducted.  Noted no overlying erythema, induration, or other signs of local infection.  Skin prepped in a sterile fashion.  Local anesthesia: Topical Ethyl chloride.  With sterile technique and under real time ultrasound guidance: 1 cc Kenalog 40, 1 cc lidocaine, 1 cc bupivacaine injected easily Completed without difficulty  Pain immediately resolved suggesting accurate placement of the medication.  Advised to call if fevers/chills, erythema, induration, drainage, or persistent bleeding.  Images permanently stored and available for review in the ultrasound unit.  Impression: Technically successful ultrasound guided injection.  Impression and Recommendations:    Plantar fasciitis, left Severe pain, injection as above. Rehab exercises given, return for custom orthotics.  Obstructive sleep apnea Excessive daytime sleepiness, uncontrolled hypertension. He has not used his CPAP in a long time. We are going to get a repeat home sleep study for CPAP.  Hypertension, essential, benign Historically under appropriate control, more recently elevated with excessive daytime sleepiness, all consistent with uncontrolled sleep apnea. ___________________________________________ Gwen Her. Dianah Field, M.D., ABFM., CAQSM. Primary Care and Sports Medicine [facility]  Adjunct Professor of [place] [employer] [facility] [employer]

## 2018-04-10 NOTE — Assessment & Plan Note (Signed)
Excessive daytime sleepiness, uncontrolled hypertension. He has not used his CPAP in a long time. We are going to get a repeat home sleep study for CPAP.

## 2018-04-10 NOTE — Assessment & Plan Note (Signed)
Historically under appropriate control, more recently elevated with excessive daytime sleepiness, all consistent with uncontrolled sleep apnea.

## 2018-04-12 ENCOUNTER — Ambulatory Visit (INDEPENDENT_AMBULATORY_CARE_PROVIDER_SITE_OTHER): Payer: Self-pay | Admitting: Sports Medicine

## 2018-04-12 ENCOUNTER — Encounter: Payer: Self-pay | Admitting: Sports Medicine

## 2018-04-12 DIAGNOSIS — M722 Plantar fascial fibromatosis: Secondary | ICD-10-CM

## 2018-04-12 NOTE — Assessment & Plan Note (Signed)
Pain-free after injection 2 days ago. Custom orthotics today. Do the exercises and return to see me in 4 to 6 weeks.

## 2018-04-12 NOTE — Progress Notes (Signed)
    Patient was fitted for a : standard, cushioned, semi-rigid orthotic. The orthotic was heated and afterward the patient stood on the orthotic blank positioned on the orthotic stand. The patient was positioned in subtalar neutral position and 10 degrees of ankle dorsiflexion in a weight bearing stance. After completion of molding, a stable base was applied to the orthotic blank. The blank was ground to a stable position for weight bearing. Size: 16 Base: [employer] and safety inspector and [place]: None The patient ambulated these, and they were very comfortable.  I spent 40 minutes with this patient, greater than 50% was face-to-face time counseling regarding the below diagnosis.  ___________________________________________ Gwen Her. Dianah Field, M.D., ABFM., CAQSM. Primary Care and [place] Instructor of [employer] [employer] [place]

## 2018-04-21 ENCOUNTER — Ambulatory Visit: Payer: Self-pay

## 2018-04-28 ENCOUNTER — Telehealth: Payer: Self-pay | Admitting: Sports Medicine

## 2018-04-28 DIAGNOSIS — M5136 Other intervertebral disc degeneration, lumbar region: Secondary | ICD-10-CM

## 2018-04-28 DIAGNOSIS — M51369 Other intervertebral disc degeneration, lumbar region without mention of lumbar back pain or lower extremity pain: Secondary | ICD-10-CM

## 2018-04-28 MED ORDER — TRAMADOL HCL 50 MG PO TABS: ORAL_TABLET | ORAL | 0 refills | Status: DC

## 2018-04-28 NOTE — Addendum Note (Signed)
Addended by: Silverio Decamp on: 04/28/2018 11:01 AM   Modules accepted: Orders

## 2018-04-28 NOTE — Telephone Encounter (Signed)
PT requested a prescription refill for Tramadol.  Please advise.

## 2018-05-01 ENCOUNTER — Ambulatory Visit (INDEPENDENT_AMBULATORY_CARE_PROVIDER_SITE_OTHER): Payer: Self-pay | Admitting: Sports Medicine

## 2018-05-01 VITALS — BP 134/84 | HR 72 | Temp 98.2°F | Wt 282.0 lb

## 2018-05-01 DIAGNOSIS — E291 Testicular hypofunction: Secondary | ICD-10-CM

## 2018-05-01 MED ORDER — TESTOSTERONE CYPIONATE 200 MG/ML IM SOLN
200.0000 mg | Freq: Once | INTRAMUSCULAR | Status: AC
Start: 2018-05-01 — End: 2018-05-01
  Administered 2018-05-01: 200 mg via INTRAMUSCULAR

## 2018-05-01 NOTE — Progress Notes (Signed)
Pt in today for testosterone injection. Medications and allergies reviewed. Pt denies chest pain, shortness of breath, headaches and denies problems with medication or mood changes. Pt received testosterone in [place] . Patient tolerated injection well without complications. Pt advised to schedule next injection in 14 days.

## 2018-05-15 ENCOUNTER — Ambulatory Visit (INDEPENDENT_AMBULATORY_CARE_PROVIDER_SITE_OTHER): Payer: Self-pay | Admitting: Sports Medicine

## 2018-05-15 VITALS — BP 136/74 | HR 68 | Temp 98.4°F | Wt 280.0 lb

## 2018-05-15 DIAGNOSIS — E291 Testicular hypofunction: Secondary | ICD-10-CM

## 2018-05-15 MED ORDER — TESTOSTERONE CYPIONATE 200 MG/ML IM SOLN
200.0000 mg | Freq: Once | INTRAMUSCULAR | Status: AC
Start: 2018-05-15 — End: 2018-05-15
  Administered 2018-05-15: 200 mg via INTRAMUSCULAR

## 2018-05-15 NOTE — Progress Notes (Signed)
   Subjective:    Patient ID: Richard Beltran, male    DOB: [birth year], 53 y.o.   MRN: [id number]  HPI Patient is here for a testosterone injection. Denies chest pain, shortness of breath, headaches, problems with mood change, or medication problems.    Review of Systems     Objective:   Physical Exam        Assessment & Plan:  Patient tolerated injection in [place] well without complications. Patient advised to schedule his next injection for 2 weeks from today.

## 2018-05-19 ENCOUNTER — Other Ambulatory Visit: Payer: Self-pay | Admitting: Sports Medicine

## 2018-05-29 ENCOUNTER — Encounter: Payer: Self-pay | Admitting: Sports Medicine

## 2018-05-29 ENCOUNTER — Ambulatory Visit: Payer: Self-pay

## 2018-05-29 ENCOUNTER — Ambulatory Visit (INDEPENDENT_AMBULATORY_CARE_PROVIDER_SITE_OTHER): Payer: Self-pay | Admitting: Sports Medicine

## 2018-05-29 DIAGNOSIS — Z0000 Encounter for general adult medical examination without abnormal findings: Secondary | ICD-10-CM

## 2018-05-29 DIAGNOSIS — E291 Testicular hypofunction: Secondary | ICD-10-CM

## 2018-05-29 DIAGNOSIS — M722 Plantar fascial fibromatosis: Secondary | ICD-10-CM

## 2018-05-29 MED ORDER — TESTOSTERONE CYPIONATE 200 MG/ML IM SOLN
200.0000 mg | Freq: Once | INTRAMUSCULAR | Status: AC
Start: 2018-05-29 — End: 2018-05-29
  Administered 2018-05-29: 200 mg via INTRAMUSCULAR

## 2018-05-29 NOTE — Progress Notes (Signed)
Subjective:    CC: Follow-up  HPI: Richard Beltran is a pleasant 53 year old male, we treated him for severe plantar fasciitis, he has had an injection, custom orthotics, rehab exercises and returns completely pain-free.  I reviewed the past medical history, family history, social history, surgical history, and allergies today and no changes were needed.  Please see the problem list section below in epic for further details.  Past Medical History: Past Medical History:  Diagnosis Date  . Hypertension   . Lumbar degenerative disc disease 10/29/2013   Post L4-L5 fusion.   . Male hypogonadism    Past Surgical History: Past Surgical History:  Procedure Laterality Date  . BACK SURGERY     Social History: Social History   Socioeconomic History  . Marital status: Married    Spouse name: Not on file  . Number of children: 4  . Years of education: 18+  . Highest education level: Not on file  Occupational History  . Occupation: Database administrator  Social Needs  . Financial resource strain: Not on file  . Food insecurity:    Worry: Not on file    Inability: Not on file  . Transportation needs:    Medical: Not on file    Non-medical: Not on file  Tobacco Use  . Smoking status: Never Smoker  . Smokeless tobacco: Never Used  Substance and Sexual Activity  . Alcohol use: No  . Drug use: No  . Sexual activity: Not on file  Lifestyle  . Physical activity:    Days per week: Not on file    Minutes per session: Not on file  . Stress: Not on file  Relationships  . Social connections:    Talks on phone: Not on file    Gets together: Not on file    Attends religious service: Not on file    Active member of club or organization: Not on file    Attends meetings of clubs or organizations: Not on file    Relationship status: Not on file  Other Topics Concern  . Not on file  Social History Narrative   Fun: Golf and read   Denies religious beliefs effecting healthcare.    Family History: Family  History  Problem Relation Age of Onset  . Diabetes Mother   . Hyperlipidemia Mother   . Healthy Father   . Healthy Maternal Grandmother   . Healthy Maternal Grandfather   . Healthy Paternal Grandmother   . Healthy Paternal Grandfather    Allergies: Allergies  Allergen Reactions  . Eggs Or Egg-Derived Products Nausea And Vomiting   Medications: See med rec.  Review of Systems: No fevers, chills, night sweats, weight loss, chest pain, or shortness of breath.   Objective:    General: Well Developed, well nourished, and in no acute distress.  Neuro: Alert and oriented x3, extra-ocular muscles intact, sensation grossly intact.  HEENT: Normocephalic, atraumatic, pupils equal round reactive to light, neck supple, no masses, no lymphadenopathy, thyroid nonpalpable.  Skin: Warm and dry, no rashes. Cardiac: Regular rate and rhythm, no murmurs rubs or gallops, no lower extremity edema.  Respiratory: Clear to auscultation bilaterally. Not using accessory muscles, speaking in full sentences.  Impression and Recommendations:    Plantar fasciitis, left Resolved after injection, custom orthotics, rehab exercises. Return as needed.  Male hypogonadism Testosterone injection today.  ___________________________________________ Richard Beltran. Richard Beltran, M.D., ABFM., CAQSM. Primary Care and Sports Medicine [place] [facility]  Adjunct Professor of [place] [employer] [facility]  of Medicine

## 2018-05-29 NOTE — Assessment & Plan Note (Signed)
Testosterone injection today. °

## 2018-05-29 NOTE — Assessment & Plan Note (Signed)
Resolved after injection, custom orthotics, rehab exercises. Return as needed.

## 2018-05-31 ENCOUNTER — Other Ambulatory Visit: Payer: Self-pay | Admitting: Sports Medicine

## 2018-06-04 ENCOUNTER — Other Ambulatory Visit: Payer: Self-pay | Admitting: Sports Medicine

## 2018-06-07 ENCOUNTER — Other Ambulatory Visit: Payer: Self-pay | Admitting: Sports Medicine

## 2018-06-07 DIAGNOSIS — M5136 Other intervertebral disc degeneration, lumbar region: Secondary | ICD-10-CM

## 2018-06-12 ENCOUNTER — Ambulatory Visit (INDEPENDENT_AMBULATORY_CARE_PROVIDER_SITE_OTHER): Payer: Self-pay | Admitting: Sports Medicine

## 2018-06-12 VITALS — BP 131/87 | HR 65 | Ht 73.0 in | Wt 276.0 lb

## 2018-06-12 DIAGNOSIS — E291 Testicular hypofunction: Secondary | ICD-10-CM

## 2018-06-12 MED ORDER — TESTOSTERONE CYPIONATE 200 MG/ML IM SOLN
200.0000 mg | Freq: Once | INTRAMUSCULAR | Status: AC
Start: 2018-06-12 — End: 2018-06-12
  Administered 2018-06-12: 200 mg via INTRAMUSCULAR

## 2018-06-12 NOTE — Progress Notes (Signed)
   Subjective:    Patient ID: Richard Beltran, male    DOB: 1965-09-08, 53 y.o.   MRN: [id number]  HPI Patient is here for a testosterone injection. Denies chest pain, shortness of breath, headaches, problems with mood change, or medication problems.   Review of Systems     Objective:   Physical Exam        Assessment & Plan:  Patient tolerated injection in [place] well without complications. Patient advised to schedule his next injection for 2 weeks from today.  Initial BP was el;evated at 141/84 and after 10 minutes of rest BP was 131/87.   Pt has not yet heard back about his cologuard- gave him contact info for [employer].

## 2018-06-26 ENCOUNTER — Ambulatory Visit (INDEPENDENT_AMBULATORY_CARE_PROVIDER_SITE_OTHER): Payer: Self-pay | Admitting: Sports Medicine

## 2018-06-26 VITALS — BP 126/80 | HR 60

## 2018-06-26 DIAGNOSIS — E291 Testicular hypofunction: Secondary | ICD-10-CM

## 2018-06-26 MED ORDER — TESTOSTERONE CYPIONATE 200 MG/ML IM SOLN
200.0000 mg | Freq: Once | INTRAMUSCULAR | Status: AC
  Administered 2018-06-26: 200 mg via INTRAMUSCULAR

## 2018-06-26 NOTE — Progress Notes (Signed)
Patient came into office today for testosterone injection. Denies chest pain, shortness of breath, headaches and problems associated with taking this medication. Patient states he has had no abnornal mood swings. Patient tolerated injection in RUOQ well without complications. Patient advised to schedule his next injection for 2 weeks from today.  Pt also given information pamphlet for Cologuard. He will contact insurance and see if it's covered. He will then let us know if we can send over the order form. No further questions.

## 2018-07-10 ENCOUNTER — Ambulatory Visit: Payer: Self-pay

## 2018-07-20 ENCOUNTER — Other Ambulatory Visit: Payer: Self-pay | Admitting: Sports Medicine

## 2018-07-20 DIAGNOSIS — M5136 Other intervertebral disc degeneration, lumbar region: Secondary | ICD-10-CM

## 2018-07-31 ENCOUNTER — Ambulatory Visit (INDEPENDENT_AMBULATORY_CARE_PROVIDER_SITE_OTHER): Payer: Self-pay | Admitting: Sports Medicine

## 2018-07-31 ENCOUNTER — Other Ambulatory Visit: Payer: Self-pay

## 2018-07-31 DIAGNOSIS — E291 Testicular hypofunction: Secondary | ICD-10-CM

## 2018-07-31 MED ORDER — TESTOSTERONE CYPIONATE 200 MG/ML IM SOLN
200.0000 mg | Freq: Once | INTRAMUSCULAR | Status: AC
  Administered 2018-07-31: 200 mg via INTRAMUSCULAR

## 2018-07-31 NOTE — Progress Notes (Signed)
Established Patient Office Visit  Subjective:  Patient ID: Richard Beltran, male    DOB: [birth year]  Age: 53 y.o. MRN: [id number]  CC:  Chief Complaint  Patient presents with  . Hypogonadism    HPI Richard Beltran is here for a testosterone injection. Denies chest pain, shortness of breath, headaches or mood changes.   Past Medical History:  Diagnosis Date  . Hypertension   . Lumbar degenerative disc disease 10/29/2013   Post L4-L5 fusion.   . Male hypogonadism     Past Surgical History:  Procedure Laterality Date  . BACK SURGERY      Family History  Problem Relation Age of Onset  . Diabetes Mother   . Hyperlipidemia Mother   . Healthy Father   . Healthy Maternal Grandmother   . Healthy Maternal Grandfather   . Healthy Paternal Grandmother   . Healthy Paternal Grandfather     Social History   Socioeconomic History  . Marital status: Married    Spouse name: Not on file  . Number of children: 4  . Years of education: 18+  . Highest education level: Not on file  Occupational History  . Occupation: Database administrator  Social Needs  . Financial resource strain: Not on file  . Food insecurity:    Worry: Not on file    Inability: Not on file  . Transportation needs:    Medical: Not on file    Non-medical: Not on file  Tobacco Use  . Smoking status: Never Smoker  . Smokeless tobacco: Never Used  Substance and Sexual Activity  . Alcohol use: No  . Drug use: No  . Sexual activity: Not on file  Lifestyle  . Physical activity:    Days per week: Not on file    Minutes per session: Not on file  . Stress: Not on file  Relationships  . Social connections:    Talks on phone: Not on file    Gets together: Not on file    Attends religious service: Not on file    Active member of club or organization: Not on file    Attends meetings of clubs or organizations: Not on file    Relationship status: Not on file  . Intimate partner violence:    Fear of current or ex  partner: Not on file    Emotionally abused: Not on file    Physically abused: Not on file    Forced sexual activity: Not on file  Other Topics Concern  . Not on file  Social History Narrative   Fun: Golf and read   Denies religious beliefs effecting healthcare.     Outpatient Medications Prior to Visit  Medication Sig Dispense Refill  . amLODipine (NORVASC) 10 MG tablet TAKE 1 TABLET BY MOUTH EVERY DAY 90 tablet 1  . carvedilol (COREG) 25 MG tablet TAKE 1 TABLET BY MOUTH 2 TIMES DAILY WITH A MEAL 180 tablet 0  . lisinopril-hydrochlorothiazide (PRINZIDE,ZESTORETIC) 20-25 MG tablet TAKE 1 TABLET BY MOUTH EVERY DAY 90 tablet 0  . meloxicam (MOBIC) 15 MG tablet Take 1 tablet (15 mg total) by mouth daily as needed for pain. 90 tablet 1  . traMADol (ULTRAM) 50 MG tablet TAKE 2 TABLETS BY MOUTH 3 TIMES A DAY AS NEEDED 180 tablet 0   No facility-administered medications prior to visit.     Allergies  Allergen Reactions  . Eggs Or Egg-Derived Products Nausea And Vomiting    ROS Review of Systems  Objective:    Physical Exam  There were no vitals taken for this visit. Wt Readings from Last 3 Encounters:  06/12/18 276 lb (125.2 kg)  05/29/18 276 lb (125.2 kg)  05/15/18 280 lb (127 kg)     Health Maintenance Due  Topic Date Due  . COLONOSCOPY  11/11/2015    There are no preventive care reminders to display for this patient.  Lab Results  Component Value Date   TSH 1.11 12/19/2017   Lab Results  Component Value Date   WBC 6.3 12/19/2017   HGB 14.4 12/19/2017   HCT 44.2 12/19/2017   MCV 89.3 12/19/2017   PLT 254 12/19/2017   Lab Results  Component Value Date   NA 139 12/19/2017   K 4.2 12/19/2017   CO2 30 12/19/2017   GLUCOSE 104 (H) 12/19/2017   BUN 14 12/19/2017   CREATININE 1.31 12/19/2017   BILITOT 0.7 12/19/2017   ALKPHOS 76 12/08/2016   AST 15 12/19/2017   ALT 21 12/19/2017   PROT 6.9 12/19/2017   ALBUMIN 4.2 12/08/2016   CALCIUM 9.6 12/19/2017    Lab Results  Component Value Date   CHOL 191 12/19/2017   Lab Results  Component Value Date   HDL 38 (L) 12/19/2017   Lab Results  Component Value Date   LDLCALC 131 (H) 12/19/2017   Lab Results  Component Value Date   TRIG 109 12/19/2017   Lab Results  Component Value Date   CHOLHDL 5.0 (H) 12/19/2017   Lab Results  Component Value Date   HGBA1C 5.2 12/19/2017      Assessment & Plan:  Hypogonadism - Patient tolerated injection well without complications. Patient advised to schedule next injection 2 weeks from today.    Problem List Items Addressed This Visit    None    Visit Diagnoses    Hypogonadism male    -  Primary   Relevant Medications   testosterone cypionate (DEPOTESTOSTERONE CYPIONATE) injection 200 mg (Completed) (Start on 07/31/2018 11:15 AM)      Meds ordered this encounter  Medications  . testosterone cypionate (DEPOTESTOSTERONE CYPIONATE) injection 200 mg    Follow-up: Return in about 2 weeks (around 08/14/2018) for testosterone injection. Durene Romans, Monico Blitz, [place]

## 2018-08-14 ENCOUNTER — Ambulatory Visit (INDEPENDENT_AMBULATORY_CARE_PROVIDER_SITE_OTHER): Payer: Self-pay | Admitting: Sports Medicine

## 2018-08-14 VITALS — BP 119/77 | HR 65 | Wt 277.0 lb

## 2018-08-14 DIAGNOSIS — E291 Testicular hypofunction: Secondary | ICD-10-CM

## 2018-08-14 MED ORDER — TESTOSTERONE CYPIONATE 200 MG/ML IM SOLN
200.0000 mg | Freq: Once | INTRAMUSCULAR | Status: AC
  Administered 2018-08-14: 200 mg via INTRAMUSCULAR

## 2018-08-14 NOTE — Progress Notes (Signed)
Established Patient Office Visit  Subjective:  Patient ID: Richard Beltran, male    DOB: 1965/08/03  Age: 53 y.o. MRN: [id number]  CC:  Chief Complaint  Patient presents with  . Hypogonadism    HPI Richard Beltran is here for a testosterone injection. Denies chest pain, shortness of breath, headaches or mood changes. He is also due for a testosterone lab test in 1 week.   Past Medical History:  Diagnosis Date  . Hypertension   . Lumbar degenerative disc disease 10/29/2013   Post L4-L5 fusion.   . Male hypogonadism     Past Surgical History:  Procedure Laterality Date  . BACK SURGERY      Family History  Problem Relation Age of Onset  . Diabetes Mother   . Hyperlipidemia Mother   . Healthy Father   . Healthy Maternal Grandmother   . Healthy Maternal Grandfather   . Healthy Paternal Grandmother   . Healthy Paternal Grandfather     Social History   Socioeconomic History  . Marital status: Married    Spouse name: Not on file  . Number of children: 4  . Years of education: 18+  . Highest education level: Not on file  Occupational History  . Occupation: Database administrator  Social Needs  . Financial resource strain: Not on file  . Food insecurity:    Worry: Not on file    Inability: Not on file  . Transportation needs:    Medical: Not on file    Non-medical: Not on file  Tobacco Use  . Smoking status: Never Smoker  . Smokeless tobacco: Never Used  Substance and Sexual Activity  . Alcohol use: No  . Drug use: No  . Sexual activity: Not on file  Lifestyle  . Physical activity:    Days per week: Not on file    Minutes per session: Not on file  . Stress: Not on file  Relationships  . Social connections:    Talks on phone: Not on file    Gets together: Not on file    Attends religious service: Not on file    Active member of club or organization: Not on file    Attends meetings of clubs or organizations: Not on file    Relationship status: Not on file  .  Intimate partner violence:    Fear of current or ex partner: Not on file    Emotionally abused: Not on file    Physically abused: Not on file    Forced sexual activity: Not on file  Other Topics Concern  . Not on file  Social History Narrative   Fun: Golf and read   Denies religious beliefs effecting healthcare.     Outpatient Medications Prior to Visit  Medication Sig Dispense Refill  . amLODipine (NORVASC) 10 MG tablet TAKE 1 TABLET BY MOUTH EVERY DAY 90 tablet 1  . carvedilol (COREG) 25 MG tablet TAKE 1 TABLET BY MOUTH 2 TIMES DAILY WITH A MEAL 180 tablet 0  . lisinopril-hydrochlorothiazide (PRINZIDE,ZESTORETIC) 20-25 MG tablet TAKE 1 TABLET BY MOUTH EVERY DAY 90 tablet 0  . meloxicam (MOBIC) 15 MG tablet Take 1 tablet (15 mg total) by mouth daily as needed for pain. 90 tablet 1  . traMADol (ULTRAM) 50 MG tablet TAKE 2 TABLETS BY MOUTH 3 TIMES A DAY AS NEEDED 180 tablet 0   No facility-administered medications prior to visit.     Allergies  Allergen Reactions  . Eggs Or Egg-Derived Products Nausea  And Vomiting    ROS Review of Systems    Objective:    Physical Exam  BP 119/77   Pulse 65   Wt 277 lb (125.6 kg)   SpO2 99%   BMI 36.55 kg/m  Wt Readings from Last 3 Encounters:  08/14/18 277 lb (125.6 kg)  06/12/18 276 lb (125.2 kg)  05/29/18 276 lb (125.2 kg)     Health Maintenance Due  Topic Date Due  . COLONOSCOPY  11/11/2015    There are no preventive care reminders to display for this patient.  Lab Results  Component Value Date   TSH 1.11 12/19/2017   Lab Results  Component Value Date   WBC 6.3 12/19/2017   HGB 14.4 12/19/2017   HCT 44.2 12/19/2017   MCV 89.3 12/19/2017   PLT 254 12/19/2017   Lab Results  Component Value Date   NA 139 12/19/2017   K 4.2 12/19/2017   CO2 30 12/19/2017   GLUCOSE 104 (H) 12/19/2017   BUN 14 12/19/2017   CREATININE 1.31 12/19/2017   BILITOT 0.7 12/19/2017   ALKPHOS 76 12/08/2016   AST 15 12/19/2017   ALT  21 12/19/2017   PROT 6.9 12/19/2017   ALBUMIN 4.2 12/08/2016   CALCIUM 9.6 12/19/2017   Lab Results  Component Value Date   CHOL 191 12/19/2017   Lab Results  Component Value Date   HDL 38 (L) 12/19/2017   Lab Results  Component Value Date   LDLCALC 131 (H) 12/19/2017   Lab Results  Component Value Date   TRIG 109 12/19/2017   Lab Results  Component Value Date   CHOLHDL 5.0 (H) 12/19/2017   Lab Results  Component Value Date   HGBA1C 5.2 12/19/2017      Assessment & Plan:  Hypogonadism - Patient tolerated injection well without complications. Patient advised to schedule next injection 14 days from today. Testosterone lab ordered and patient advised to go to the lab next Monday.    Problem List Items Addressed This Visit    None    Visit Diagnoses    Hypogonadism male    -  Primary   Relevant Medications   testosterone cypionate (DEPOTESTOSTERONE CYPIONATE) injection 200 mg (Completed) (Start on 08/14/2018  9:45 AM)   Other Relevant Orders   Testosterone      Meds ordered this encounter  Medications  . testosterone cypionate (DEPOTESTOSTERONE CYPIONATE) injection 200 mg    Follow-up: Return in about 2 weeks (around 08/28/2018) for testosterone injection. Durene Romans, Monico Blitz, [place]

## 2018-08-21 ENCOUNTER — Other Ambulatory Visit: Payer: Self-pay | Admitting: Sports Medicine

## 2018-08-21 DIAGNOSIS — M5136 Other intervertebral disc degeneration, lumbar region: Secondary | ICD-10-CM

## 2018-08-28 ENCOUNTER — Ambulatory Visit (INDEPENDENT_AMBULATORY_CARE_PROVIDER_SITE_OTHER): Payer: Self-pay | Admitting: Sports Medicine

## 2018-08-28 VITALS — BP 145/70 | HR 71 | Wt 280.0 lb

## 2018-08-28 DIAGNOSIS — E291 Testicular hypofunction: Secondary | ICD-10-CM

## 2018-08-28 MED ORDER — TESTOSTERONE CYPIONATE 200 MG/ML IM SOLN
200.0000 mg | Freq: Once | INTRAMUSCULAR | Status: AC
  Administered 2018-08-28: 200 mg via INTRAMUSCULAR

## 2018-08-28 NOTE — Progress Notes (Signed)
Established Patient Office Visit  Subjective:  Patient ID: Richard Beltran, male    DOB: Nov 03, 1965  Age: 53 y.o. MRN: [id number]  CC:  Chief Complaint  Patient presents with  . Hypogonadism    HPI MATIX HENSHAW is here for a testosterone injection. Denies chest pain, shortness of breath, headaches or mood changes.   Past Medical History:  Diagnosis Date  . Hypertension   . Lumbar degenerative disc disease 10/29/2013   Post L4-L5 fusion.   . Male hypogonadism     Past Surgical History:  Procedure Laterality Date  . BACK SURGERY      Family History  Problem Relation Age of Onset  . Diabetes Mother   . Hyperlipidemia Mother   . Healthy Father   . Healthy Maternal Grandmother   . Healthy Maternal Grandfather   . Healthy Paternal Grandmother   . Healthy Paternal Grandfather     Social History   Socioeconomic History  . Marital status: Married    Spouse name: Not on file  . Number of children: 4  . Years of education: 18+  . Highest education level: Not on file  Occupational History  . Occupation: Database administrator  Social Needs  . Financial resource strain: Not on file  . Food insecurity:    Worry: Not on file    Inability: Not on file  . Transportation needs:    Medical: Not on file    Non-medical: Not on file  Tobacco Use  . Smoking status: Never Smoker  . Smokeless tobacco: Never Used  Substance and Sexual Activity  . Alcohol use: No  . Drug use: No  . Sexual activity: Not on file  Lifestyle  . Physical activity:    Days per week: Not on file    Minutes per session: Not on file  . Stress: Not on file  Relationships  . Social connections:    Talks on phone: Not on file    Gets together: Not on file    Attends religious service: Not on file    Active member of club or organization: Not on file    Attends meetings of clubs or organizations: Not on file    Relationship status: Not on file  . Intimate partner violence:    Fear of current or ex  partner: Not on file    Emotionally abused: Not on file    Physically abused: Not on file    Forced sexual activity: Not on file  Other Topics Concern  . Not on file  Social History Narrative   Fun: Golf and read   Denies religious beliefs effecting healthcare.     Outpatient Medications Prior to Visit  Medication Sig Dispense Refill  . amLODipine (NORVASC) 10 MG tablet TAKE 1 TABLET BY MOUTH EVERY DAY 90 tablet 1  . carvedilol (COREG) 25 MG tablet TAKE 1 TABLET BY MOUTH 2 TIMES DAILY WITH A MEAL 180 tablet 0  . lisinopril-hydrochlorothiazide (PRINZIDE,ZESTORETIC) 20-25 MG tablet TAKE 1 TABLET BY MOUTH EVERY DAY 90 tablet 0  . meloxicam (MOBIC) 15 MG tablet Take 1 tablet (15 mg total) by mouth daily as needed for pain. 90 tablet 1  . traMADol (ULTRAM) 50 MG tablet TAKE 2 TABLETS BY MOUTH 3 TIMES A DAY AS NEEDED 180 tablet 0   No facility-administered medications prior to visit.     Allergies  Allergen Reactions  . Eggs Or Egg-Derived Products Nausea And Vomiting    ROS Review of Systems  Objective:    Physical Exam  BP (!) 145/70   Pulse 71   Wt 280 lb (127 kg)   SpO2 100%   BMI 36.94 kg/m  Wt Readings from Last 3 Encounters:  08/28/18 280 lb (127 kg)  08/14/18 277 lb (125.6 kg)  06/12/18 276 lb (125.2 kg)     Health Maintenance Due  Topic Date Due  . COLONOSCOPY  11/11/2015    There are no preventive care reminders to display for this patient.  Lab Results  Component Value Date   TSH 1.11 12/19/2017   Lab Results  Component Value Date   WBC 6.3 12/19/2017   HGB 14.4 12/19/2017   HCT 44.2 12/19/2017   MCV 89.3 12/19/2017   PLT 254 12/19/2017   Lab Results  Component Value Date   NA 139 12/19/2017   K 4.2 12/19/2017   CO2 30 12/19/2017   GLUCOSE 104 (H) 12/19/2017   BUN 14 12/19/2017   CREATININE 1.31 12/19/2017   BILITOT 0.7 12/19/2017   ALKPHOS 76 12/08/2016   AST 15 12/19/2017   ALT 21 12/19/2017   PROT 6.9 12/19/2017   ALBUMIN 4.2  12/08/2016   CALCIUM 9.6 12/19/2017   Lab Results  Component Value Date   CHOL 191 12/19/2017   Lab Results  Component Value Date   HDL 38 (L) 12/19/2017   Lab Results  Component Value Date   LDLCALC 131 (H) 12/19/2017   Lab Results  Component Value Date   TRIG 109 12/19/2017   Lab Results  Component Value Date   CHOLHDL 5.0 (H) 12/19/2017   Lab Results  Component Value Date   HGBA1C 5.2 12/19/2017      Assessment & Plan:  Hypogonadism - Patient tolerated injection well without complications. Patient advised to schedule next injection 14 days from today. Patient advised to go to the lab on Monday morning for Testosterone lab.    Problem List Items Addressed This Visit    None    Visit Diagnoses    Hypogonadism male    -  Primary   Relevant Medications   testosterone cypionate (DEPOTESTOSTERONE CYPIONATE) injection 200 mg (Completed) (Start on 08/28/2018 10:45 AM)      Meds ordered this encounter  Medications  . testosterone cypionate (DEPOTESTOSTERONE CYPIONATE) injection 200 mg    Follow-up: Return in about 2 weeks (around 09/11/2018) for testosterone injection. Durene Romans, Monico Blitz, [place]

## 2018-08-29 ENCOUNTER — Other Ambulatory Visit: Payer: Self-pay | Admitting: Sports Medicine

## 2018-09-11 ENCOUNTER — Ambulatory Visit (INDEPENDENT_AMBULATORY_CARE_PROVIDER_SITE_OTHER): Payer: Self-pay | Admitting: Sports Medicine

## 2018-09-11 VITALS — BP 129/77 | HR 65

## 2018-09-11 DIAGNOSIS — E291 Testicular hypofunction: Secondary | ICD-10-CM

## 2018-09-11 MED ORDER — TESTOSTERONE CYPIONATE 200 MG/ML IM SOLN
200.0000 mg | Freq: Once | INTRAMUSCULAR | Status: AC
  Administered 2018-09-11: 200 mg via INTRAMUSCULAR

## 2018-09-11 NOTE — Progress Notes (Signed)
Patient came into office today for testosterone injection. Denies chest pain, shortness of breath, headaches and problems associated with taking this medication. Patient states he has had no abnornal mood swings. Patient tolerated injection in RUOQ well without complications. Patient advised to schedule his next injection for 2 weeks from today.

## 2018-09-18 ENCOUNTER — Other Ambulatory Visit: Payer: Self-pay | Admitting: Sports Medicine

## 2018-09-18 DIAGNOSIS — M5136 Other intervertebral disc degeneration, lumbar region: Secondary | ICD-10-CM

## 2018-09-26 ENCOUNTER — Other Ambulatory Visit: Payer: Self-pay

## 2018-09-26 ENCOUNTER — Ambulatory Visit (INDEPENDENT_AMBULATORY_CARE_PROVIDER_SITE_OTHER): Payer: Self-pay | Admitting: Sports Medicine

## 2018-09-26 VITALS — BP 118/74 | HR 67 | Wt 279.0 lb

## 2018-09-26 DIAGNOSIS — E291 Testicular hypofunction: Secondary | ICD-10-CM

## 2018-09-26 MED ORDER — TESTOSTERONE CYPIONATE 200 MG/ML IM SOLN
200.0000 mg | Freq: Once | INTRAMUSCULAR | Status: AC
  Administered 2018-09-26: 200 mg via INTRAMUSCULAR

## 2018-09-26 MED ORDER — LISINOPRIL-HYDROCHLOROTHIAZIDE 20-25 MG PO TABS: 1.0000 | ORAL_TABLET | Freq: Every day | ORAL | 1 refills | Status: DC

## 2018-09-26 NOTE — Progress Notes (Signed)
Established Patient Office Visit  Subjective:  Patient ID: Richard Beltran, male    DOB: February 18, 1966  Age: 53 y.o. MRN: [id number]  CC:  Chief Complaint  Patient presents with  . Hypogonadism    HPI KARSON REEDE is here for a testosterone injection. Denies chest pain, shortness of breath, headaches or mood changes.   Past Medical History:  Diagnosis Date  . Hypertension   . Lumbar degenerative disc disease 10/29/2013   Post L4-L5 fusion.   . Male hypogonadism     Past Surgical History:  Procedure Laterality Date  . BACK SURGERY      Family History  Problem Relation Age of Onset  . Diabetes Mother   . Hyperlipidemia Mother   . Healthy Father   . Healthy Maternal Grandmother   . Healthy Maternal Grandfather   . Healthy Paternal Grandmother   . Healthy Paternal Grandfather     Social History   Socioeconomic History  . Marital status: Married    Spouse name: Not on file  . Number of children: 4  . Years of education: 18+  . Highest education level: Not on file  Occupational History  . Occupation: Database administrator  Social Needs  . Financial resource strain: Not on file  . Food insecurity:    Worry: Not on file    Inability: Not on file  . Transportation needs:    Medical: Not on file    Non-medical: Not on file  Tobacco Use  . Smoking status: Never Smoker  . Smokeless tobacco: Never Used  Substance and Sexual Activity  . Alcohol use: No  . Drug use: No  . Sexual activity: Not on file  Lifestyle  . Physical activity:    Days per week: Not on file    Minutes per session: Not on file  . Stress: Not on file  Relationships  . Social connections:    Talks on phone: Not on file    Gets together: Not on file    Attends religious service: Not on file    Active member of club or organization: Not on file    Attends meetings of clubs or organizations: Not on file    Relationship status: Not on file  . Intimate partner violence:    Fear of current or ex  partner: Not on file    Emotionally abused: Not on file    Physically abused: Not on file    Forced sexual activity: Not on file  Other Topics Concern  . Not on file  Social History Narrative   Fun: Golf and read   Denies religious beliefs effecting healthcare.     Outpatient Medications Prior to Visit  Medication Sig Dispense Refill  . amLODipine (NORVASC) 10 MG tablet TAKE 1 TABLET BY MOUTH EVERY DAY 90 tablet 1  . carvedilol (COREG) 25 MG tablet TAKE 1 TABLET BY MOUTH 2 TIMES DAILY WITH A MEAL 180 tablet 0  . meloxicam (MOBIC) 15 MG tablet Take 1 tablet (15 mg total) by mouth daily as needed for pain. 90 tablet 1  . traMADol (ULTRAM) 50 MG tablet TAKE 2 TABLETS BY MOUTH 3 TIMES A DAY AS NEEDED 180 tablet 0  . lisinopril-hydrochlorothiazide (PRINZIDE,ZESTORETIC) 20-25 MG tablet TAKE 1 TABLET BY MOUTH EVERY DAY 90 tablet 0   No facility-administered medications prior to visit.     Allergies  Allergen Reactions  . Eggs Or Egg-Derived Products Nausea And Vomiting    ROS Review of Systems  Objective:    Physical Exam  BP 118/74   Pulse 67   Wt 279 lb (126.6 kg)   SpO2 99%   BMI 36.81 kg/m  Wt Readings from Last 3 Encounters:  09/26/18 279 lb (126.6 kg)  08/28/18 280 lb (127 kg)  08/14/18 277 lb (125.6 kg)     Health Maintenance Due  Topic Date Due  . COLONOSCOPY  11/11/2015    There are no preventive care reminders to display for this patient.  Lab Results  Component Value Date   TSH 1.11 12/19/2017   Lab Results  Component Value Date   WBC 6.3 12/19/2017   HGB 14.4 12/19/2017   HCT 44.2 12/19/2017   MCV 89.3 12/19/2017   PLT 254 12/19/2017   Lab Results  Component Value Date   NA 139 12/19/2017   K 4.2 12/19/2017   CO2 30 12/19/2017   GLUCOSE 104 (H) 12/19/2017   BUN 14 12/19/2017   CREATININE 1.31 12/19/2017   BILITOT 0.7 12/19/2017   ALKPHOS 76 12/08/2016   AST 15 12/19/2017   ALT 21 12/19/2017   PROT 6.9 12/19/2017   ALBUMIN 4.2  12/08/2016   CALCIUM 9.6 12/19/2017   Lab Results  Component Value Date   CHOL 191 12/19/2017   Lab Results  Component Value Date   HDL 38 (L) 12/19/2017   Lab Results  Component Value Date   LDLCALC 131 (H) 12/19/2017   Lab Results  Component Value Date   TRIG 109 12/19/2017   Lab Results  Component Value Date   CHOLHDL 5.0 (H) 12/19/2017   Lab Results  Component Value Date   HGBA1C 5.2 12/19/2017      Assessment & Plan:  Hypogonadism - Patient tolerated injection well without complications. Patient advised to schedule next injection 14 days from today.    Problem List Items Addressed This Visit    None    Visit Diagnoses    Hypogonadism male    -  Primary   Relevant Medications   testosterone cypionate (DEPOTESTOSTERONE CYPIONATE) injection 200 mg (Completed)      Meds ordered this encounter  Medications  . testosterone cypionate (DEPOTESTOSTERONE CYPIONATE) injection 200 mg  . lisinopril-hydrochlorothiazide (ZESTORETIC) 20-25 MG tablet    Sig: Take 1 tablet by mouth daily.    Dispense:  90 tablet    Refill:  1    Follow-up: No follow-ups on file.    Lavell Luster, [place]

## 2018-09-30 ENCOUNTER — Other Ambulatory Visit: Payer: Self-pay | Admitting: Sports Medicine

## 2018-10-09 ENCOUNTER — Ambulatory Visit (INDEPENDENT_AMBULATORY_CARE_PROVIDER_SITE_OTHER): Payer: Self-pay | Admitting: Sports Medicine

## 2018-10-09 VITALS — BP 133/74 | HR 58 | Ht 73.0 in | Wt 279.0 lb

## 2018-10-09 DIAGNOSIS — E291 Testicular hypofunction: Secondary | ICD-10-CM

## 2018-10-09 MED ORDER — TESTOSTERONE CYPIONATE 200 MG/ML IM SOLN
200.0000 mg | Freq: Once | INTRAMUSCULAR | Status: AC
  Administered 2018-10-09: 200 mg via INTRAMUSCULAR

## 2018-10-09 NOTE — Progress Notes (Signed)
   Subjective:    Patient ID: Richard Beltran, male    DOB: 01/18/1966, 53 y.o.   MRN: [id number]  HPI Patient is here for a testosterone injection. Denies chest pain, shortness of breath, headaches, problems with mood change, or medication problems.   Review of Systems     Objective:   Physical Exam        Assessment & Plan:  Patient tolerated injection in RUOQ well without complications. Patient advised to schedule his next injection for 2 weeks from today.

## 2018-10-23 ENCOUNTER — Other Ambulatory Visit: Payer: Self-pay

## 2018-10-23 ENCOUNTER — Ambulatory Visit (INDEPENDENT_AMBULATORY_CARE_PROVIDER_SITE_OTHER): Payer: Self-pay | Admitting: Sports Medicine

## 2018-10-23 VITALS — BP 137/69 | HR 65 | Ht 73.0 in | Wt 277.0 lb

## 2018-10-23 DIAGNOSIS — M5136 Other intervertebral disc degeneration, lumbar region: Secondary | ICD-10-CM

## 2018-10-23 DIAGNOSIS — E291 Testicular hypofunction: Secondary | ICD-10-CM

## 2018-10-23 MED ORDER — TRAMADOL HCL 50 MG PO TABS: ORAL_TABLET | ORAL | 0 refills | Status: DC

## 2018-10-23 MED ORDER — TESTOSTERONE CYPIONATE 200 MG/ML IM SOLN
200.0000 mg | Freq: Once | INTRAMUSCULAR | Status: AC
  Administered 2018-10-23: 200 mg via INTRAMUSCULAR

## 2018-10-23 NOTE — Telephone Encounter (Signed)
Requesting RF on Tramadol   Last written 09/18/18 for #180, 2 tabs TID PRN, no RF  RX pended

## 2018-10-23 NOTE — Progress Notes (Signed)
   Subjective:    Patient ID: Richard Beltran, male    DOB: 15-Oct-1965, 53 y.o.   MRN: [id number]  HPI Patient is here for a testosterone injection. Denies chest pain, shortness of breath, headaches, problems with mood change, or medication problems.    Review of Systems     Objective:   Physical Exam        Assessment & Plan:  Patient tolerated injection in LUOQ well without complications. Patient advised to schedule his next injection for 2 weeks from today.

## 2018-11-06 ENCOUNTER — Ambulatory Visit (INDEPENDENT_AMBULATORY_CARE_PROVIDER_SITE_OTHER): Payer: Self-pay | Admitting: Sports Medicine

## 2018-11-06 ENCOUNTER — Other Ambulatory Visit: Payer: Self-pay

## 2018-11-06 VITALS — BP 150/82 | HR 61 | Wt 275.0 lb

## 2018-11-06 DIAGNOSIS — E291 Testicular hypofunction: Secondary | ICD-10-CM

## 2018-11-06 MED ORDER — TESTOSTERONE CYPIONATE 200 MG/ML IM SOLN
200.0000 mg | Freq: Once | INTRAMUSCULAR | Status: AC
  Administered 2018-11-06: 200 mg via INTRAMUSCULAR

## 2018-11-06 NOTE — Progress Notes (Signed)
   Subjective:    Patient ID: Richard Beltran, male    DOB: Nov 26, 1965, 53 y.o.   MRN: [id number]  HPI Patient is here for a testosterone injection. Denies chest pain, shortness of breath, headaches, problems with mood change, or medication problems.   Review of Systems     Objective:   Physical Exam        Assessment & Plan:  Patient tolerated injection in RUOQ well without complications. Patient advised to schedule his next injection for 2 weeks from today.

## 2018-11-17 ENCOUNTER — Other Ambulatory Visit: Payer: Self-pay

## 2018-11-17 ENCOUNTER — Encounter: Payer: Self-pay | Admitting: Emergency Medicine

## 2018-11-17 ENCOUNTER — Emergency Department
Admission: EM | Admit: 2018-11-17 | Discharge: 2018-11-17 | Disposition: A | Discharge status: Home / Self Care | Payer: Self-pay | Source: Home / Self Care | Attending: Emergency Medicine | Admitting: Emergency Medicine

## 2018-11-17 DIAGNOSIS — Z20822 Contact with and (suspected) exposure to covid-19: Secondary | ICD-10-CM

## 2018-11-17 DIAGNOSIS — U071 COVID-19: Secondary | ICD-10-CM | POA: Diagnosis not present

## 2018-11-17 DIAGNOSIS — Z20828 Contact with and (suspected) exposure to other viral communicable diseases: Secondary | ICD-10-CM

## 2018-11-17 DIAGNOSIS — R6889 Other general symptoms and signs: Secondary | ICD-10-CM

## 2018-11-17 DIAGNOSIS — M791 Myalgia, unspecified site: Secondary | ICD-10-CM

## 2018-11-17 DIAGNOSIS — R51 Headache: Secondary | ICD-10-CM | POA: Diagnosis not present

## 2018-11-17 DIAGNOSIS — R05 Cough: Secondary | ICD-10-CM | POA: Diagnosis not present

## 2018-11-17 DIAGNOSIS — R059 Cough, unspecified: Secondary | ICD-10-CM

## 2018-11-17 MED ORDER — DEXAMETHASONE 4 MG PO TABS: 4.0000 mg | ORAL_TABLET | Freq: Two times a day (BID) | ORAL | 0 refills | Status: AC

## 2018-11-17 NOTE — ED Triage Notes (Signed)
Patient reports feeling miserable for past 3 days; loss of taste, general aches, chills. His wife is ill and was tested yesterday for COVID. They have not travelled past 4 weeks.

## 2018-11-17 NOTE — Discharge Instructions (Addendum)
Based on your history and physical exam, I am very suspicious that you have COVID-19 as cause for your symptoms. No evidence of bacterial infection, so no antibiotic indicated. Your vital signs and blood pressure and oxygen level are all within normal limits and lungs are clear.  No signs of pneumonia. Treatment: I sent a prescription for dexamethasone to your pharmacy.  Take as prescribed.  This should improve your muscle aches and lessen the inflammation if it is COVID-19. Rest at home.  Drink plenty of fluids.  May take Tylenol if needed for fever. In my opinion, you can be treated with self-care at home, but you need to go to the emergency room if any worsening symptoms such as chest pain or shortness of breath or fainting. You need to quarantine for 14 days, (or at least until today's COVID test comes back.) The COVID test we swabbed today, will be sent to the lab and sometimes it takes 7 to 10 days for the results to come back. Please read attached instruction sheets on COVID-19 and preventing the spread of COVID-19 when you are sick.

## 2018-11-17 NOTE — ED Provider Notes (Addendum)
[facility]    CSN: [id number] Arrival date & time: 11/17/18  1421     History   Chief Complaint Chief Complaint  Patient presents with  . Generalized Body Aches  . Cough  . Chills    HPI Richard Beltran is a 53 y.o. male.    Patient reports feeling flu sxs, "miserable for past 3 days"; loss of taste, general aches, fever, chills. Mild cough. His wife is ill ("not as sick as me"), and was tested yesterday for COVID.  Fever to 100 with chills, sweats, severe myalgias, fatigue, nonfocal headache.  Symptoms are stable, not  worsening today.,Trying OTC fever reducing medicine and rest and fluids.  Has decreased appetite, but tolerating some liquids and solids by mouth.  No history of recent tick bite. No history of travel within the last 2 weeks. No history of exposure to anyone with documented COVID-19.  He works as a Theme park manager and in Scientist, research (medical).   Review of Systems  +  Minimal, nonproductive Cough. No chest pain. No shortness of breath No respiratory distress No leg edema. + fatigue + minimal nasal congestion No sore throat No swollen anterior neck glands No nausea or vomiting or abdominal pain. No  diarrhea No  acute vision changes No  stiff neck No  focal weakness No  Syncope.  Denies lightheadedness. No  seizures No  GU symptoms No  new rash   Pertinent items noted in HPI and remainder of comprehensive ROS otherwise negative.   Past Medical History:  Diagnosis Date  . Hypertension   . Lumbar degenerative disc disease 10/29/2013   Post L4-L5 fusion.   . Male hypogonadism     Patient Active Problem List   Diagnosis Date Noted  . Plantar fasciitis, left 04/10/2018  . Insomnia 07/29/2015  . Obesity 11/18/2014  . Obstructive sleep apnea 10/22/2014  . Male hypogonadism 11/06/2013  . Hypertension, essential, benign 10/29/2013  . Lumbar degenerative disc disease 10/29/2013  . Pulmonary nodule 10/29/2013  . Annual physical exam 10/29/2013  .  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 BACK SURGERY         Home Medications    Prior to Admission medications   Medication Sig Start Date End Date Taking? Authorizing Provider  amLODipine (NORVASC) 10 MG tablet TAKE 1 TABLET BY MOUTH EVERY DAY 09/30/18   Silverio Decamp, MD  carvedilol (COREG) 25 MG tablet TAKE 1 TABLET BY MOUTH 2 TIMES DAILY WITH A MEAL 08/29/18   Silverio Decamp, MD  dexamethasone (DECADRON) 4 MG tablet Take 1 tablet (4 mg total) by mouth 2 (two) times daily for 7 days. 11/17/18 11/24/18  Jacqulyn Cane, MD  lisinopril-hydrochlorothiazide (ZESTORETIC) 20-25 MG tablet Take 1 tablet by mouth daily. 09/26/18   Silverio Decamp, MD  meloxicam (MOBIC) 15 MG tablet Take 1 tablet (15 mg total) by mouth daily as needed for pain. 12/05/17   Silverio Decamp, MD  traMADol Veatrice Bourbon) 50 MG tablet Take 2 tablets 3 times daily as needed 10/23/18   Silverio Decamp, MD    Family History Family History  Problem Relation Age of Onset  . Diabetes Mother   . Hyperlipidemia Mother   . Healthy Father   . Healthy Maternal Grandmother   . Healthy Maternal Grandfather   . Healthy Paternal Grandmother   . Healthy Paternal Grandfather     Social History Social History   Tobacco Use  .  Smoking status: Never Smoker  . Smokeless tobacco: Never Used  Substance Use Topics  . Alcohol use: No  . Drug use: No   Non-smoker.  Denies alcohol or drugs.  Allergies   Eggs or egg-derived products   Review of Systems Review of Systems  All other systems reviewed and are negative.  Pertinent items noted in HPI and remainder of comprehensive ROS otherwise negative.   Physical Exam Triage Vital Signs ED Triage Vitals  Enc Vitals Group     BP 11/17/18 1454 133/85     Pulse Rate 11/17/18 1454 77     Resp 11/17/18 1454 18     Temp 11/17/18 1454 98.3 F (36.8 C)     Temp Source 11/17/18 1454  Oral     SpO2 11/17/18 1454 96 %     Weight --      Height --      Head Circumference --      Peak Flow --      Pain Score 11/17/18 1455 3     Pain Loc --      Pain Edu? --      Excl. in [place]? --    No data found.  Updated Vital Signs BP 133/85 (BP Location: Right Arm)   Pulse 77   Temp 98.3 F (36.8 C) (Oral)   Resp 18   SpO2 96%    Physical Exam  Nursing note and vitals reviewed. Constitutional: Appears well-developed and well-nourished. No distress.  Alert, cooperative. Non-toxic appearance.  Appears ill, fatigued, but no cardiorespiratory distress     HENT:  Head: Normocephalic and atraumatic.  Right Ear: Tympanic membrane and external ear normal.  Left Ear: Tympanic membrane and external ear normal.  Nose:  mild rhinorrhea.  Minimal congestion. Mouth/Throat:   Mucous membranes are normal.   Posterior pharynx normal.  No redness.  No oropharyngeal exudate.   Eyes:   Conjunctivae are normal. No scleral icterus.   Right eye ,no discharge.   Left eye ,no discharge.   Neck:   Supple.  No adenopathy.  Heart:  normal rate, regular rhythm and normal heart sounds.  No murmur.  Lungs:    Breath sounds normal. No stridor. No respiratory distress.   No wheezes. No rales.  Breath sounds equal bilaterally.  Abdominal: Soft. There is no tenderness.  No organomegaly or masses.  Musculoskeletal: No edema.  Nontender.  No deformity.  Neurological:   Alert.    Cranial nerves intact.  Motor and sensory normal.  No focal weakness.  Gait normal.  Skin:   Warm and intact. No rash noted.     UC Treatments / Results  Labs (all labs ordered are listed, but only abnormal results are displayed) Labs Reviewed  SARS-COV-2 RNA,(COVID-19) QUALITATIVE NAAT    EKG   Radiology No results found.  Procedures Procedures (including critical care time)  Medications Ordered in UC Medications - No data to display  Initial Impression / Assessment and Plan / UC Course  I  have reviewed the triage vital signs and the nursing notes.  Pertinent labs & imaging results that were available during my care of the patient were reviewed by me and considered in my medical decision making (see chart for details).  Clinical Course as of Nov 21 1030  Tue Nov 21, 2018  1028 SARS CoV2 RNA(!!): Detected [DM]    Clinical Course User Index [DM] Jacqulyn Cane, MD    Constellation of symptoms of fever chills myalgias, mild cough and  decreased smell.  I strongly suspect COVID-19.  He is stable with normal lung exam, stable vital signs and normal pulse ox of 96% room air. In my opinion, he is stable to treat as an outpatient with close follow-up. Consulted with up-to-date and hospitalist at [facility].  He is a candidate for p.o. dexamethasone, which may hasten his improvement inflammation. He requested pain med for severe myalgias, and I explained that in his case, dexamethasone would be the choice, especially because he is already taking tramadol and meloxicam.  Precautions discussed at length. AVS provided and verbal instructions at length, see discharge instructions below. Precautions discussed. Red flags discussed. Questions invited and answered. Patient voiced understanding and agreement.  Final Clinical Impressions(s) / UC Diagnoses   Final diagnoses:  Suspected Covid-19 Virus Infection  Flu-like symptoms  Cough  Myalgia     Discharge Instructions     Based on your history and physical exam, I am very suspicious that you have COVID-19 as cause for your symptoms. No evidence of bacterial infection, so no antibiotic indicated. Your vital signs and blood pressure and oxygen level are all within normal limits and lungs are clear.  No signs of pneumonia. Treatment: I sent a prescription for dexamethasone to your pharmacy.  Take as prescribed.  This should improve your muscle aches and lessen the inflammation if it is COVID-19. Rest at home.  Drink plenty of fluids.   May take Tylenol if needed for fever. In my opinion, you can be treated with self-care at home, but you need to go to the emergency room if any worsening symptoms such as chest pain or shortness of breath or fainting. You need to quarantine for 14 days, (or at least until today's COVID test comes back.) The COVID test we swabbed today, will be sent to the lab and sometimes it takes 7 to 10 days for the results to come back. Please read attached instruction sheets on COVID-19 and preventing the spread of COVID-19 when you are sick.    ED Prescriptions    Medication Sig Dispense Auth. Provider   dexamethasone (DECADRON) 4 MG tablet Take 1 tablet (4 mg total) by mouth 2 (two) times daily for 7 days. 14 tablet Jacqulyn Cane, MD        Jacqulyn Cane, MD 11/18/18 Yevette Edwards    Jacqulyn Cane, MD 11/21/18 [phone]

## 2018-11-19 LAB — SARS-COV-2 RNA,(COVID-19) QUALITATIVE NAAT: SARS CoV2 RNA: DETECTED — CR

## 2018-11-20 ENCOUNTER — Other Ambulatory Visit: Payer: Self-pay

## 2018-11-20 ENCOUNTER — Ambulatory Visit (INDEPENDENT_AMBULATORY_CARE_PROVIDER_SITE_OTHER): Payer: Self-pay | Admitting: Sports Medicine

## 2018-11-20 ENCOUNTER — Telehealth: Payer: Self-pay

## 2018-11-20 VITALS — BP 144/80 | HR 72 | Temp 98.2°F | Wt 280.0 lb

## 2018-11-20 DIAGNOSIS — M5136 Other intervertebral disc degeneration, lumbar region: Secondary | ICD-10-CM

## 2018-11-20 DIAGNOSIS — E291 Testicular hypofunction: Secondary | ICD-10-CM

## 2018-11-20 MED ORDER — TRAMADOL HCL 50 MG PO TABS: ORAL_TABLET | ORAL | 0 refills | Status: DC

## 2018-11-20 MED ORDER — TESTOSTERONE CYPIONATE 200 MG/ML IM SOLN
200.0000 mg | Freq: Once | INTRAMUSCULAR | Status: AC
  Administered 2018-11-20: 200 mg via INTRAMUSCULAR

## 2018-11-20 NOTE — Telephone Encounter (Signed)
Spoke with patient, gave positive results.  Went over COVID restrictions.  What to look for and when to get emergency tx.  Pt voiced understanding.

## 2018-11-20 NOTE — Progress Notes (Signed)
Pt tolerated injection in LUOQ well without complications.  Pt advise to make next appointment in 14 days. Pt also reqeusted a refill of tramadol. -[facility]

## 2018-11-29 ENCOUNTER — Telehealth: Payer: Self-pay | Admitting: *Deleted

## 2018-11-29 NOTE — Telephone Encounter (Signed)
Pt called requesting RTW note. Per Dr Assunta Found can return to work tomorrow. Note written and left at the front desk.

## 2018-12-04 ENCOUNTER — Other Ambulatory Visit: Payer: Self-pay

## 2018-12-04 ENCOUNTER — Ambulatory Visit (INDEPENDENT_AMBULATORY_CARE_PROVIDER_SITE_OTHER): Payer: Self-pay | Admitting: Sports Medicine

## 2018-12-04 VITALS — BP 139/80 | HR 70 | Wt 280.0 lb

## 2018-12-04 DIAGNOSIS — E291 Testicular hypofunction: Secondary | ICD-10-CM

## 2018-12-04 MED ORDER — TESTOSTERONE CYPIONATE 200 MG/ML IM SOLN
200.0000 mg | Freq: Once | INTRAMUSCULAR | Status: AC
  Administered 2018-12-04: 200 mg via INTRAMUSCULAR

## 2018-12-04 NOTE — Progress Notes (Signed)
Established Patient Office Visit  Subjective:  Patient ID: Richard Beltran, male    DOB: 26-Jun-1965  Age: 54 y.o. MRN: [id number]  CC:  Chief Complaint  Patient presents with  . Injections    HPI Richard Beltran is here for a testosterone injection. Denies chest pain, shortness of breath, headaches or mood changes.   Past Medical History:  Diagnosis Date  . Hypertension   . Lumbar degenerative disc disease 10/29/2013   Post L4-L5 fusion.   . Male hypogonadism     Past Surgical History:  Procedure Laterality Date  . BACK SURGERY      Family History  Problem Relation Age of Onset  . Diabetes Mother   . Hyperlipidemia Mother   . Healthy Father   . Healthy Maternal Grandmother   . Healthy Maternal Grandfather   . Healthy Paternal Grandmother   . Healthy Paternal Grandfather     Social History   Socioeconomic History  . Marital status: Married    Spouse name: Not on file  . Number of children: 4  . Years of education: 18+  . Highest education level: Not on file  Occupational History  . Occupation: Database administrator  Social Needs  . Financial resource strain: Not on file  . Food insecurity    Worry: Not on file    Inability: Not on file  . Transportation needs    Medical: Not on file    Non-medical: Not on file  Tobacco Use  . Smoking status: Never Smoker  . Smokeless tobacco: Never Used  Substance and Sexual Activity  . Alcohol use: No  . Drug use: No  . Sexual activity: Not on file  Lifestyle  . Physical activity    Days per week: Not on file    Minutes per session: Not on file  . Stress: Not on file  Relationships  . Social Herbalist on phone: Not on file    Gets together: Not on file    Attends religious service: Not on file    Active member of club or organization: Not on file    Attends meetings of clubs or organizations: Not on file    Relationship status: Not on file  . Intimate partner violence    Fear of current or ex partner: Not  on file    Emotionally abused: Not on file    Physically abused: Not on file    Forced sexual activity: Not on file  Other Topics Concern  . Not on file  Social History Narrative   Fun: Golf and read   Denies religious beliefs effecting healthcare.     Outpatient Medications Prior to Visit  Medication Sig Dispense Refill  . amLODipine (NORVASC) 10 MG tablet TAKE 1 TABLET BY MOUTH EVERY DAY 90 tablet 1  . carvedilol (COREG) 25 MG tablet TAKE 1 TABLET BY MOUTH 2 TIMES DAILY WITH A MEAL 180 tablet 0  . lisinopril-hydrochlorothiazide (ZESTORETIC) 20-25 MG tablet Take 1 tablet by mouth daily. 90 tablet 1  . meloxicam (MOBIC) 15 MG tablet Take 1 tablet (15 mg total) by mouth daily as needed for pain. 90 tablet 1  . traMADol (ULTRAM) 50 MG tablet Take 2 tablets 3 times daily as needed 180 tablet 0   No facility-administered medications prior to visit.     Allergies  Allergen Reactions  . Eggs Or Egg-Derived Products Nausea And Vomiting    ROS Review of Systems    Objective:  Physical Exam  BP 139/80   Pulse 70   Wt 280 lb (127 kg)   SpO2 100%   BMI 36.94 kg/m  Wt Readings from Last 3 Encounters:  12/04/18 280 lb (127 kg)  11/20/18 280 lb (127 kg)  11/06/18 275 lb (124.7 kg)     Health Maintenance Due  Topic Date Due  . COLONOSCOPY  11/11/2015    There are no preventive care reminders to display for this patient.  Lab Results  Component Value Date   TSH 1.11 12/19/2017   Lab Results  Component Value Date   WBC 6.3 12/19/2017   HGB 14.4 12/19/2017   HCT 44.2 12/19/2017   MCV 89.3 12/19/2017   PLT 254 12/19/2017   Lab Results  Component Value Date   NA 139 12/19/2017   K 4.2 12/19/2017   CO2 30 12/19/2017   GLUCOSE 104 (H) 12/19/2017   BUN 14 12/19/2017   CREATININE 1.31 12/19/2017   BILITOT 0.7 12/19/2017   ALKPHOS 76 12/08/2016   AST 15 12/19/2017   ALT 21 12/19/2017   PROT 6.9 12/19/2017   ALBUMIN 4.2 12/08/2016   CALCIUM 9.6 12/19/2017    Lab Results  Component Value Date   CHOL 191 12/19/2017   Lab Results  Component Value Date   HDL 38 (L) 12/19/2017   Lab Results  Component Value Date   LDLCALC 131 (H) 12/19/2017   Lab Results  Component Value Date   TRIG 109 12/19/2017   Lab Results  Component Value Date   CHOLHDL 5.0 (H) 12/19/2017   Lab Results  Component Value Date   HGBA1C 5.2 12/19/2017      Assessment & Plan:  Hypogonadism - Patient tolerated injection well without complications. Patient advised to schedule next injection 14 days from today.    Problem List Items Addressed This Visit    Male hypogonadism - Primary      Meds ordered this encounter  Medications  . testosterone cypionate (DEPOTESTOSTERONE CYPIONATE) injection 200 mg    Follow-up: Return in about 2 weeks (around 12/18/2018) for testosterone injection. Durene Romans, Monico Blitz, [place]

## 2018-12-05 NOTE — Telephone Encounter (Signed)
Pt called for a return to work note.  Stated that he has been sx free since 11/28/2018.  Note printed and left at the front desk.

## 2018-12-18 ENCOUNTER — Other Ambulatory Visit: Payer: Self-pay

## 2018-12-18 ENCOUNTER — Ambulatory Visit (INDEPENDENT_AMBULATORY_CARE_PROVIDER_SITE_OTHER): Payer: Self-pay | Admitting: Sports Medicine

## 2018-12-18 ENCOUNTER — Encounter: Payer: Self-pay | Admitting: Sports Medicine

## 2018-12-18 ENCOUNTER — Ambulatory Visit: Payer: Self-pay

## 2018-12-18 VITALS — BP 109/69 | HR 66 | Ht 73.0 in | Wt 279.0 lb

## 2018-12-18 DIAGNOSIS — Z8619 Personal history of other infectious and parasitic diseases: Secondary | ICD-10-CM

## 2018-12-18 DIAGNOSIS — Z1211 Encounter for screening for malignant neoplasm of colon: Secondary | ICD-10-CM

## 2018-12-18 DIAGNOSIS — I10 Essential (primary) hypertension: Secondary | ICD-10-CM

## 2018-12-18 DIAGNOSIS — E291 Testicular hypofunction: Secondary | ICD-10-CM

## 2018-12-18 DIAGNOSIS — Z0000 Encounter for general adult medical examination without abnormal findings: Secondary | ICD-10-CM

## 2018-12-18 DIAGNOSIS — Z8616 Personal history of COVID-19: Secondary | ICD-10-CM | POA: Insufficient documentation

## 2018-12-18 MED ORDER — TESTOSTERONE CYPIONATE 200 MG/ML IM SOLN
200.0000 mg | Freq: Once | INTRAMUSCULAR | Status: AC
  Administered 2018-12-18: 200 mg via INTRAMUSCULAR

## 2018-12-18 MED ORDER — DEXAMETHASONE 4 MG PO TABS: 4.0000 mg | ORAL_TABLET | Freq: Three times a day (TID) | ORAL | 0 refills | Status: DC

## 2018-12-18 MED ORDER — MELOXICAM 15 MG PO TABS: 15.0000 mg | ORAL_TABLET | Freq: Every day | ORAL | 1 refills | Status: DC | PRN

## 2018-12-18 NOTE — Progress Notes (Signed)
Subjective:    CC: Follow-up  HPI: Richard Beltran returns, he has recovered from COVID-19, diagnosed about a month ago, he feels pretty good with the exception of a bit of fatigue, and residual achiness, he tells me he felt pretty bad during his illness, severe muscle aches, body aches and fatigue.  Otherwise doing well.  He had some labile blood pressures in previous visits and is here for reevaluation.  Male hypogonadism: Due for testosterone injection.  Preventive measures: Due for colon cancer screening.  I reviewed the past medical history, family history, social history, surgical history, and allergies today and no changes were needed.  Please see the problem list section below in epic for further details.  Past Medical History: Past Medical History:  Diagnosis Date  . Hypertension   . Lumbar degenerative disc disease 10/29/2013   Post L4-L5 fusion.   . Male hypogonadism    Past Surgical History: Past Surgical History:  Procedure Laterality Date  . BACK SURGERY     Social History: Social History   Socioeconomic History  . Marital status: Married    Spouse name: Not on file  . Number of children: 4  . Years of education: 18+  . Highest education level: Not on file  Occupational History  . Occupation: Database administrator  Social Needs  . Financial resource strain: Not on file  . Food insecurity    Worry: Not on file    Inability: Not on file  . Transportation needs    Medical: Not on file    Non-medical: Not on file  Tobacco Use  . Smoking status: Never Smoker  . Smokeless tobacco: Never Used  Substance and Sexual Activity  . Alcohol use: No  . Drug use: No  . Sexual activity: Not on file  Lifestyle  . Physical activity    Days per week: Not on file    Minutes per session: Not on file  . Stress: Not on file  Relationships  . Social Herbalist on phone: Not on file    Gets together: Not on file    Attends religious service: Not on file    Active member of club  or organization: Not on file    Attends meetings of clubs or organizations: Not on file    Relationship status: Not on file  Other Topics Concern  . Not on file  Social History Narrative   Fun: Golf and read   Denies religious beliefs effecting healthcare.    Family History: Family History  Problem Relation Age of Onset  . Diabetes Mother   . Hyperlipidemia Mother   . Healthy Father   . Healthy Maternal Grandmother   . Healthy Maternal Grandfather   . Healthy Paternal Grandmother   . Healthy Paternal Grandfather    Allergies: Allergies  Allergen Reactions  . Eggs Or Egg-Derived Products Nausea And Vomiting   Medications: See med rec.  Review of Systems: No fevers, chills, night sweats, weight loss, chest pain, or shortness of breath.   Objective:    General: Well Developed, well nourished, and in no acute distress.  Neuro: Alert and oriented x3, extra-ocular muscles intact, sensation grossly intact.  HEENT: Normocephalic, atraumatic, pupils equal round reactive to light, neck supple, no masses, no lymphadenopathy, thyroid nonpalpable.  Skin: Warm and dry, no rashes. Cardiac: Regular rate and rhythm, no murmurs rubs or gallops, no lower extremity edema.  Respiratory: Clear to auscultation bilaterally. Not using accessory muscles, speaking in full sentences.  Impression and  Recommendations:    History of 2019 novel coronavirus disease (COVID-19) Recovered COVID-19. He had all of the classic symptoms. Adding Decadron, he still has some persistent fatigue and achiness. Also adding COVID-19 antibodies.  Hypertension, essential, benign Well-controlled, no changes.  Annual physical exam 53 years old, due for Cologuard testing.   ___________________________________________ Gwen Her. Dianah Field, M.D., ABFM., CAQSM. Primary Care and Sports Medicine [facility]  Adjunct Professor of [employer] [facility] [employer] [facility] [employer]  [employer]

## 2018-12-18 NOTE — Assessment & Plan Note (Signed)
Well controlled, no changes 

## 2018-12-18 NOTE — Assessment & Plan Note (Signed)
Recovered COVID-19. He had all of the classic symptoms. Adding Decadron, he still has some persistent fatigue and achiness. Also adding COVID-19 antibodies.

## 2018-12-18 NOTE — Assessment & Plan Note (Signed)
53 years old, due for Cologuard testing.

## 2018-12-25 ENCOUNTER — Other Ambulatory Visit: Payer: Self-pay | Admitting: Sports Medicine

## 2018-12-25 DIAGNOSIS — M5136 Other intervertebral disc degeneration, lumbar region: Secondary | ICD-10-CM

## 2019-01-01 ENCOUNTER — Ambulatory Visit (INDEPENDENT_AMBULATORY_CARE_PROVIDER_SITE_OTHER): Payer: Self-pay | Admitting: Sports Medicine

## 2019-01-01 ENCOUNTER — Other Ambulatory Visit: Payer: Self-pay

## 2019-01-01 VITALS — BP 149/78 | HR 65 | Ht 73.0 in | Wt 281.0 lb

## 2019-01-01 DIAGNOSIS — E291 Testicular hypofunction: Secondary | ICD-10-CM

## 2019-01-01 MED ORDER — TESTOSTERONE CYPIONATE 200 MG/ML IM SOLN
200.0000 mg | INTRAMUSCULAR | Status: DC
  Administered 2019-01-01: 200 mg via INTRAMUSCULAR

## 2019-01-01 NOTE — Progress Notes (Signed)
Acute Office Visit  Subjective:    Patient ID: Richard Beltran, male    DOB: 07/01/1965, 53 y.o.   MRN: [id number]  Chief Complaint  Patient presents with  . Hypogonadism    HPI Patient is in today for testosterone injection. He denies chest pain, shortness of breath, headaches, and problems with medication or mood changes.  He stated that he did not take his blood pressure medication today.  Past Medical History:  Diagnosis Date  . Hypertension   . Lumbar degenerative disc disease 10/29/2013   Post L4-L5 fusion.   . Male hypogonadism     Past Surgical History:  Procedure Laterality Date  . BACK SURGERY      Family History  Problem Relation Age of Onset  . Diabetes Mother   . Hyperlipidemia Mother   . Healthy Father   . Healthy Maternal Grandmother   . Healthy Maternal Grandfather   . Healthy Paternal Grandmother   . Healthy Paternal Grandfather     Social History   Socioeconomic History  . Marital status: Married    Spouse name: Not on file  . Number of children: 4  . Years of education: 18+  . Highest education level: Not on file  Occupational History  . Occupation: Database administrator  Social Needs  . Financial resource strain: Not on file  . Food insecurity    Worry: Not on file    Inability: Not on file  . Transportation needs    Medical: Not on file    Non-medical: Not on file  Tobacco Use  . Smoking status: Never Smoker  . Smokeless tobacco: Never Used  Substance and Sexual Activity  . Alcohol use: No  . Drug use: No  . Sexual activity: Not on file  Lifestyle  . Physical activity    Days per week: Not on file    Minutes per session: Not on file  . Stress: Not on file  Relationships  . Social Herbalist on phone: Not on file    Gets together: Not on file    Attends religious service: Not on file    Active member of club or organization: Not on file    Attends meetings of clubs or organizations: Not on file    Relationship status: Not on  file  . Intimate partner violence    Fear of current or ex partner: Not on file    Emotionally abused: Not on file    Physically abused: Not on file    Forced sexual activity: Not on file  Other Topics Concern  . Not on file  Social History Narrative   Fun: Golf and read   Denies religious beliefs effecting healthcare.     Outpatient Medications Prior to Visit  Medication Sig Dispense Refill  . amLODipine (NORVASC) 10 MG tablet TAKE 1 TABLET BY MOUTH EVERY DAY 90 tablet 1  . carvedilol (COREG) 25 MG tablet TAKE 1 TABLET BY MOUTH 2 TIMES DAILY WITH A MEAL 180 tablet 0  . dexamethasone (DECADRON) 4 MG tablet Take 1 tablet (4 mg total) by mouth 3 (three) times daily. 15 tablet 0  . lisinopril-hydrochlorothiazide (ZESTORETIC) 20-25 MG tablet Take 1 tablet by mouth daily. 90 tablet 1  . meloxicam (MOBIC) 15 MG tablet Take 1 tablet (15 mg total) by mouth daily as needed for pain. 90 tablet 1  . traMADol (ULTRAM) 50 MG tablet TAKE 2 TABLETS BY MOUTH 3 TIMES DAILY AS NEEDED 180 tablet 0  No facility-administered medications prior to visit.     Allergies  Allergen Reactions  . Eggs Or Egg-Derived Products Nausea And Vomiting    ROS     Objective:    Physical Exam  BP (!) 149/78   Pulse 65   Ht 6\' 1"  (1.854 m)   Wt 281 lb (127.5 kg)   BMI 37.07 kg/m  Wt Readings from Last 3 Encounters:  01/01/19 281 lb (127.5 kg)  12/18/18 279 lb (126.6 kg)  12/04/18 280 lb (127 kg)    Health Maintenance Due  Topic Date Due  . COLONOSCOPY  11/11/2015    There are no preventive care reminders to display for this patient.   Lab Results  Component Value Date   TSH 1.11 12/19/2017   Lab Results  Component Value Date   WBC 6.3 12/19/2017   HGB 14.4 12/19/2017   HCT 44.2 12/19/2017   MCV 89.3 12/19/2017   PLT 254 12/19/2017   Lab Results  Component Value Date   NA 139 12/19/2017   K 4.2 12/19/2017   CO2 30 12/19/2017   GLUCOSE 104 (H) 12/19/2017   BUN 14 12/19/2017    CREATININE 1.31 12/19/2017   BILITOT 0.7 12/19/2017   ALKPHOS 76 12/08/2016   AST 15 12/19/2017   ALT 21 12/19/2017   PROT 6.9 12/19/2017   ALBUMIN 4.2 12/08/2016   CALCIUM 9.6 12/19/2017   Lab Results  Component Value Date   CHOL 191 12/19/2017   Lab Results  Component Value Date   HDL 38 (L) 12/19/2017   Lab Results  Component Value Date   LDLCALC 131 (H) 12/19/2017   Lab Results  Component Value Date   TRIG 109 12/19/2017   Lab Results  Component Value Date   CHOLHDL 5.0 (H) 12/19/2017   Lab Results  Component Value Date   HGBA1C 5.2 12/19/2017       Assessment & Plan:   Problem List Items Addressed This Visit    None    patient tolerated injection well without any complications. Patient advised to schedule next injection is 14 days.   No orders of the defined types were placed in this encounter.    Bo Mcclintock, [place]

## 2019-01-15 ENCOUNTER — Other Ambulatory Visit: Payer: Self-pay

## 2019-01-15 ENCOUNTER — Ambulatory Visit (INDEPENDENT_AMBULATORY_CARE_PROVIDER_SITE_OTHER): Payer: Self-pay | Admitting: Sports Medicine

## 2019-01-15 VITALS — BP 137/81 | HR 61

## 2019-01-15 DIAGNOSIS — E291 Testicular hypofunction: Secondary | ICD-10-CM

## 2019-01-15 MED ORDER — TESTOSTERONE CYPIONATE 200 MG/ML IM SOLN
200.0000 mg | Freq: Once | INTRAMUSCULAR | Status: AC
  Administered 2019-01-15: 200 mg via INTRAMUSCULAR

## 2019-01-15 NOTE — Progress Notes (Signed)
Patient came into office today for testosterone injection. Denies chest pain, shortness of breath, headaches and problems associated with taking this medication. Patient states he has had no abnornal mood swings. Patient tolerated injection in RUOQ well without complications. Patient advised to schedule his next injection for 2 weeks from today. 

## 2019-01-22 ENCOUNTER — Other Ambulatory Visit: Payer: Self-pay | Admitting: Sports Medicine

## 2019-01-22 DIAGNOSIS — M5136 Other intervertebral disc degeneration, lumbar region: Secondary | ICD-10-CM

## 2019-01-29 ENCOUNTER — Ambulatory Visit (INDEPENDENT_AMBULATORY_CARE_PROVIDER_SITE_OTHER): Payer: Self-pay | Admitting: Sports Medicine

## 2019-01-29 VITALS — BP 134/80 | HR 62 | Temp 98.2°F | Wt 279.1 lb

## 2019-01-29 DIAGNOSIS — E291 Testicular hypofunction: Secondary | ICD-10-CM

## 2019-01-29 MED ORDER — TESTOSTERONE CYPIONATE 200 MG/ML IM SOLN
200.0000 mg | Freq: Once | INTRAMUSCULAR | Status: AC
  Administered 2019-01-29: 200 mg via INTRAMUSCULAR

## 2019-01-29 NOTE — Progress Notes (Signed)
Patient is here for testosterone injection. Denies chest pain, SOB, palpitations, headaches, mood or medication changes. Pt tolerated injection well on LUOQ without complications. Patient advised to schedule next injection in 14 days.

## 2019-02-12 ENCOUNTER — Ambulatory Visit (INDEPENDENT_AMBULATORY_CARE_PROVIDER_SITE_OTHER): Payer: Self-pay | Admitting: Sports Medicine

## 2019-02-12 ENCOUNTER — Other Ambulatory Visit: Payer: Self-pay

## 2019-02-12 VITALS — BP 133/75 | HR 65 | Wt 279.0 lb

## 2019-02-12 DIAGNOSIS — E291 Testicular hypofunction: Secondary | ICD-10-CM

## 2019-02-12 MED ORDER — TESTOSTERONE CYPIONATE 200 MG/ML IM SOLN
200.0000 mg | Freq: Once | INTRAMUSCULAR | Status: AC
  Administered 2019-02-12: 200 mg via INTRAMUSCULAR

## 2019-02-12 NOTE — Progress Notes (Signed)
Patient came into office today for testosterone injection. Denies chest pain, shortness of breath, headaches and problems associated with taking this medication. Patient states he has had no abnornal mood swings. Patient tolerated injection in RUOQ well without complications. Patient advised to schedule his next injection for 2 weeks from today. 

## 2019-02-19 ENCOUNTER — Other Ambulatory Visit: Payer: Self-pay | Admitting: Sports Medicine

## 2019-02-19 DIAGNOSIS — M5136 Other intervertebral disc degeneration, lumbar region: Secondary | ICD-10-CM

## 2019-02-26 ENCOUNTER — Ambulatory Visit (INDEPENDENT_AMBULATORY_CARE_PROVIDER_SITE_OTHER): Payer: Self-pay | Admitting: Sports Medicine

## 2019-02-26 ENCOUNTER — Other Ambulatory Visit: Payer: Self-pay

## 2019-02-26 VITALS — Temp 97.6°F | Ht 73.0 in | Wt 279.0 lb

## 2019-02-26 DIAGNOSIS — E291 Testicular hypofunction: Secondary | ICD-10-CM

## 2019-02-26 MED ORDER — TESTOSTERONE CYPIONATE 200 MG/ML IM SOLN
200.0000 mg | Freq: Once | INTRAMUSCULAR | Status: AC
  Administered 2019-02-26: 200 mg via INTRAMUSCULAR

## 2019-02-26 NOTE — Progress Notes (Signed)
Patient presents to clinic for testosterone injection. Patient denies any chest pain, shortness of breath, headaches or other problems associated with taking this medication. He also states he has not had any abnormal mood swings.  Patient tolerated injection well in [place] with no immediate complications. Patient was advised to schedule in 2 weeks from today and he voices understanding.

## 2019-03-12 ENCOUNTER — Other Ambulatory Visit: Payer: Self-pay

## 2019-03-12 ENCOUNTER — Encounter: Payer: Self-pay | Admitting: Sports Medicine

## 2019-03-12 ENCOUNTER — Ambulatory Visit (INDEPENDENT_AMBULATORY_CARE_PROVIDER_SITE_OTHER): Payer: Self-pay | Admitting: Sports Medicine

## 2019-03-12 VITALS — BP 130/82 | HR 74 | Temp 98.2°F | Wt 280.0 lb

## 2019-03-12 DIAGNOSIS — E291 Testicular hypofunction: Secondary | ICD-10-CM

## 2019-03-12 MED ORDER — TESTOSTERONE CYPIONATE 200 MG/ML IM SOLN
200.0000 mg | Freq: Once | INTRAMUSCULAR | Status: AC
  Administered 2019-03-12: 200 mg via INTRAMUSCULAR

## 2019-03-12 NOTE — Progress Notes (Signed)
Patient presents to clinic for testosterone injection. Patient denies any chest pain, shortness of breath, headaches or other problems associated with taking this medication. He also states he has not had any abnormal mood swings.  Patient tolerated injection well in [place] with no immediate complications. Patient was advised to schedule in 2 weeks from today and he voices understanding.

## 2019-03-26 ENCOUNTER — Other Ambulatory Visit: Payer: Self-pay

## 2019-03-26 ENCOUNTER — Ambulatory Visit (INDEPENDENT_AMBULATORY_CARE_PROVIDER_SITE_OTHER): Payer: Self-pay | Admitting: Osteopathic Medicine

## 2019-03-26 VITALS — BP 144/85 | HR 65 | Temp 98.6°F | Wt 280.0 lb

## 2019-03-26 DIAGNOSIS — E291 Testicular hypofunction: Secondary | ICD-10-CM

## 2019-03-26 MED ORDER — TESTOSTERONE CYPIONATE 200 MG/ML IM SOLN
200.0000 mg | Freq: Once | INTRAMUSCULAR | Status: AC
  Administered 2019-03-26: 200 mg via INTRAMUSCULAR

## 2019-03-26 NOTE — Progress Notes (Signed)
Pt in today for testosterone injection. Medications and allergies reviewed. Pt denies chest pain, shortness of breath, headaches and denies problems with medication or mood changes. Pt received 200mg   testosterone in RUQ . Patient tolerated injection well without complications. Pt advised to schedule next injection in 2 weeks.

## 2019-04-02 ENCOUNTER — Other Ambulatory Visit: Payer: Self-pay | Admitting: Sports Medicine

## 2019-04-03 ENCOUNTER — Other Ambulatory Visit: Payer: Self-pay | Admitting: Sports Medicine

## 2019-04-03 DIAGNOSIS — M5136 Other intervertebral disc degeneration, lumbar region: Secondary | ICD-10-CM

## 2019-04-09 ENCOUNTER — Ambulatory Visit (INDEPENDENT_AMBULATORY_CARE_PROVIDER_SITE_OTHER): Payer: Self-pay | Admitting: Sports Medicine

## 2019-04-09 VITALS — BP 136/78 | HR 67 | Wt 279.0 lb

## 2019-04-09 DIAGNOSIS — E291 Testicular hypofunction: Secondary | ICD-10-CM

## 2019-04-09 MED ORDER — TESTOSTERONE CYPIONATE 200 MG/ML IM SOLN
200.0000 mg | Freq: Once | INTRAMUSCULAR | Status: AC
  Administered 2019-04-09: 200 mg via INTRAMUSCULAR

## 2019-04-09 NOTE — Progress Notes (Signed)
Established Patient Office Visit  Subjective:  Patient ID: Richard Beltran, male    DOB: 1965/08/09  Age: 53 y.o. MRN: [id number]  CC:  Chief Complaint  Patient presents with  . Hypogonadism    HPI Richard Beltran is here for a testosterone injection. Denies chest pain, shortness of breath, headaches or mood changes.   Past Medical History:  Diagnosis Date  . Hypertension   . Lumbar degenerative disc disease 10/29/2013   Post L4-L5 fusion.   . Male hypogonadism     Past Surgical History:  Procedure Laterality Date  . BACK SURGERY      Family History  Problem Relation Age of Onset  . Diabetes Mother   . Hyperlipidemia Mother   . Healthy Father   . Healthy Maternal Grandmother   . Healthy Maternal Grandfather   . Healthy Paternal Grandmother   . Healthy Paternal Grandfather     Social History   Socioeconomic History  . Marital status: Married    Spouse name: Not on file  . Number of children: 4  . Years of education: 18+  . Highest education level: Not on file  Occupational History  . Occupation: Database administrator  Social Needs  . Financial resource strain: Not on file  . Food insecurity    Worry: Not on file    Inability: Not on file  . Transportation needs    Medical: Not on file    Non-medical: Not on file  Tobacco Use  . Smoking status: Never Smoker  . Smokeless tobacco: Never Used  Substance and Sexual Activity  . Alcohol use: No  . Drug use: No  . Sexual activity: Not on file  Lifestyle  . Physical activity    Days per week: Not on file    Minutes per session: Not on file  . Stress: Not on file  Relationships  . Social Herbalist on phone: Not on file    Gets together: Not on file    Attends religious service: Not on file    Active member of club or organization: Not on file    Attends meetings of clubs or organizations: Not on file    Relationship status: Not on file  . Intimate partner violence    Fear of current or ex partner:  Not on file    Emotionally abused: Not on file    Physically abused: Not on file    Forced sexual activity: Not on file  Other Topics Concern  . Not on file  Social History Narrative   Fun: Golf and read   Denies religious beliefs effecting healthcare.     Outpatient Medications Prior to Visit  Medication Sig Dispense Refill  . amLODipine (NORVASC) 10 MG tablet TAKE 1 TABLET BY MOUTH EVERY DAY 90 tablet 1  . carvedilol (COREG) 25 MG tablet TAKE 1 TABLET BY MOUTH 2 TIMES DAILY WITH A MEAL 180 tablet 0  . dexamethasone (DECADRON) 4 MG tablet Take 1 tablet (4 mg total) by mouth 3 (three) times daily. 15 tablet 0  . lisinopril-hydrochlorothiazide (ZESTORETIC) 20-25 MG tablet TAKE 1 TABLET BY MOUTH EVERY DAY 90 tablet 1  . meloxicam (MOBIC) 15 MG tablet Take 1 tablet (15 mg total) by mouth daily as needed for pain. 90 tablet 1  . traMADol (ULTRAM) 50 MG tablet TAKE 2 TABLETS BY MOUTH 3 TIMES DAILY AS NEEDED 180 tablet 0   No facility-administered medications prior to visit.     Allergies  Allergen Reactions  . Eggs Or Egg-Derived Products Nausea And Vomiting    ROS Review of Systems    Objective:    Physical Exam  BP 136/78   Pulse 67   Wt 279 lb (126.6 kg)   SpO2 99%   BMI 36.81 kg/m  Wt Readings from Last 3 Encounters:  04/09/19 279 lb (126.6 kg)  03/26/19 280 lb (127 kg)  03/12/19 280 lb (127 kg)     Health Maintenance Due  Topic Date Due  . COLONOSCOPY  11/11/2015    There are no preventive care reminders to display for this patient.  Lab Results  Component Value Date   TSH 1.11 12/19/2017   Lab Results  Component Value Date   WBC 6.3 12/19/2017   HGB 14.4 12/19/2017   HCT 44.2 12/19/2017   MCV 89.3 12/19/2017   PLT 254 12/19/2017   Lab Results  Component Value Date   NA 139 12/19/2017   K 4.2 12/19/2017   CO2 30 12/19/2017   GLUCOSE 104 (H) 12/19/2017   BUN 14 12/19/2017   CREATININE 1.31 12/19/2017   BILITOT 0.7 12/19/2017   ALKPHOS 76  12/08/2016   AST 15 12/19/2017   ALT 21 12/19/2017   PROT 6.9 12/19/2017   ALBUMIN 4.2 12/08/2016   CALCIUM 9.6 12/19/2017   Lab Results  Component Value Date   CHOL 191 12/19/2017   Lab Results  Component Value Date   HDL 38 (L) 12/19/2017   Lab Results  Component Value Date   LDLCALC 131 (H) 12/19/2017   Lab Results  Component Value Date   TRIG 109 12/19/2017   Lab Results  Component Value Date   CHOLHDL 5.0 (H) 12/19/2017   Lab Results  Component Value Date   HGBA1C 5.2 12/19/2017      Assessment & Plan:  Hypogonadism - Patient tolerated injection well without complications. Patient advised to schedule next injection 14 days from today.    Problem List Items Addressed This Visit    Male hypogonadism - Primary      Meds ordered this encounter  Medications  . testosterone cypionate (DEPOTESTOSTERONE CYPIONATE) injection 200 mg    Follow-up: Return in about 2 weeks (around 04/23/2019) for testosterone injection. Durene Romans, Monico Blitz, [place]

## 2019-04-18 ENCOUNTER — Other Ambulatory Visit: Payer: Self-pay | Admitting: Sports Medicine

## 2019-04-23 ENCOUNTER — Ambulatory Visit (INDEPENDENT_AMBULATORY_CARE_PROVIDER_SITE_OTHER): Payer: Self-pay | Admitting: Sports Medicine

## 2019-04-23 ENCOUNTER — Other Ambulatory Visit: Payer: Self-pay

## 2019-04-23 VITALS — BP 144/92 | HR 74 | Temp 98.4°F | Wt 281.0 lb

## 2019-04-23 DIAGNOSIS — E291 Testicular hypofunction: Secondary | ICD-10-CM

## 2019-04-23 MED ORDER — TESTOSTERONE CYPIONATE 200 MG/ML IM SOLN
200.0000 mg | Freq: Once | INTRAMUSCULAR | Status: AC
  Administered 2019-04-23: 11:00:00 200 mg via INTRAMUSCULAR

## 2019-05-07 ENCOUNTER — Telehealth (INDEPENDENT_AMBULATORY_CARE_PROVIDER_SITE_OTHER): Payer: Self-pay | Admitting: Sports Medicine

## 2019-05-07 ENCOUNTER — Other Ambulatory Visit: Payer: Self-pay

## 2019-05-07 ENCOUNTER — Ambulatory Visit (INDEPENDENT_AMBULATORY_CARE_PROVIDER_SITE_OTHER): Payer: Self-pay | Admitting: Sports Medicine

## 2019-05-07 VITALS — BP 120/75 | HR 67 | Temp 97.8°F | Wt 278.0 lb

## 2019-05-07 DIAGNOSIS — E291 Testicular hypofunction: Secondary | ICD-10-CM

## 2019-05-07 DIAGNOSIS — M51369 Other intervertebral disc degeneration, lumbar region without mention of lumbar back pain or lower extremity pain: Secondary | ICD-10-CM

## 2019-05-07 DIAGNOSIS — M5136 Other intervertebral disc degeneration, lumbar region: Secondary | ICD-10-CM

## 2019-05-07 MED ORDER — MELOXICAM 15 MG PO TABS: ORAL_TABLET | ORAL | 1 refills | Status: DC

## 2019-05-07 MED ORDER — TRAMADOL HCL 50 MG PO TABS: ORAL_TABLET | ORAL | 0 refills | Status: DC

## 2019-05-07 MED ORDER — TESTOSTERONE CYPIONATE 200 MG/ML IM SOLN
200.0000 mg | Freq: Once | INTRAMUSCULAR | Status: AC
  Administered 2019-05-07: 11:00:00 200 mg via INTRAMUSCULAR

## 2019-05-07 NOTE — Telephone Encounter (Signed)
Wanted a refill on Meloxicam and traMADol, forgot to ask about it during nurse visit today

## 2019-05-07 NOTE — Progress Notes (Signed)
Pt in today for testosterone injection. Patient denies chest pain, shortness of breath, headaches and denies problems with medication or mood changes. Patietn received 200 mg  testosterone in LUOQ. Patient tolerated injection well without complications.He was advised to schedule in 2 weeks for another testosterone injection. No other questions.

## 2019-05-07 NOTE — Telephone Encounter (Signed)
Refilled today

## 2019-05-21 ENCOUNTER — Ambulatory Visit: Payer: Self-pay

## 2019-06-26 ENCOUNTER — Other Ambulatory Visit: Payer: Self-pay | Admitting: Sports Medicine

## 2019-06-26 DIAGNOSIS — M5136 Other intervertebral disc degeneration, lumbar region: Secondary | ICD-10-CM

## 2019-07-02 ENCOUNTER — Ambulatory Visit: Payer: Self-pay

## 2019-07-05 ENCOUNTER — Other Ambulatory Visit: Payer: Self-pay

## 2019-07-05 ENCOUNTER — Ambulatory Visit (INDEPENDENT_AMBULATORY_CARE_PROVIDER_SITE_OTHER): Payer: Self-pay | Admitting: Sports Medicine

## 2019-07-05 VITALS — BP 130/75 | HR 64 | Ht 73.0 in | Wt 272.0 lb

## 2019-07-05 DIAGNOSIS — E291 Testicular hypofunction: Secondary | ICD-10-CM

## 2019-07-05 MED ORDER — TESTOSTERONE CYPIONATE 200 MG/ML IM SOLN
200.0000 mg | Freq: Once | INTRAMUSCULAR | Status: AC
  Administered 2019-07-05: 200 mg via INTRAMUSCULAR

## 2019-07-05 NOTE — Progress Notes (Signed)
Patient is here for testosterone injection. Denies chest pain, shortness of breath, headaches, and problems with medication or mood changes.   Patient is late for injection, he states just didn't have time to come in to the office. Last injection 05/07/2019.  Patient tolerated 200 mg injection to RUOQ well without complications. Patient advised to schedule next injection in 2 weeks.

## 2019-07-16 ENCOUNTER — Other Ambulatory Visit: Payer: Self-pay

## 2019-07-16 ENCOUNTER — Ambulatory Visit (INDEPENDENT_AMBULATORY_CARE_PROVIDER_SITE_OTHER): Payer: Self-pay | Admitting: Family Medicine

## 2019-07-16 ENCOUNTER — Encounter: Payer: Self-pay | Admitting: Family Medicine

## 2019-07-16 DIAGNOSIS — M5386 Other specified dorsopathies, lumbar region: Secondary | ICD-10-CM | POA: Insufficient documentation

## 2019-07-16 MED ORDER — METHYLPREDNISOLONE 4 MG PO TBPK: ORAL_TABLET | ORAL | 0 refills | Status: DC

## 2019-07-16 MED ORDER — CYCLOBENZAPRINE HCL 10 MG PO TABS: 10.0000 mg | ORAL_TABLET | Freq: Three times a day (TID) | ORAL | 0 refills | Status: DC | PRN

## 2019-07-16 NOTE — Patient Instructions (Addendum)
Nice to meet you today! Please start steroid taper.  You may use muscle relaxer as needed.  You can also continue tramadol as needed.  Use caution with flexeril especially if taking with tramadol as this can make you drowsy.    Sciatica Rehab Ask your health care provider which exercises are safe for you. Do exercises exactly as told by your health care provider and adjust them as directed. It is normal to feel mild stretching, pulling, tightness, or discomfort as you do these exercises. Stop right away if you feel sudden pain or your pain gets worse. Do not begin these exercises until told by your health care provider. Stretching and range-of-motion exercises These exercises warm up your muscles and joints and improve the movement and flexibility of your hips and back. These exercises also help to relieve pain, numbness, and tingling. Sciatic nerve glide 1. Sit in a chair with your head facing down toward your chest. Place your hands behind your back. Let your shoulders slump forward. 2. Slowly straighten one of your legs while you tilt your head back as if you are looking toward the ceiling. Only straighten your leg as far as you can without making your symptoms worse. 3. Hold this position for __________ seconds. 4. Slowly return to the starting position. 5. Repeat with your other leg. Repeat __________ times. Complete this exercise __________ times a day. Knee to chest with hip adduction and internal rotation  1. Lie on your back on a firm surface with both legs straight. 2. Bend one of your knees and move it up toward your chest until you feel a gentle stretch in your lower back and buttock. Then, move your knee toward the shoulder that is on the opposite side from your leg. This is hip adduction and internal rotation. ? Hold your leg in this position by holding on to the front of your knee. 3. Hold this position for __________ seconds. 4. Slowly return to the starting  position. 5. Repeat with your other leg. Repeat __________ times. Complete this exercise __________ times a day. Prone extension on elbows  1. Lie on your abdomen on a firm surface. A bed may be too soft for this exercise. 2. Prop yourself up on your elbows. 3. Use your arms to help lift your chest up until you feel a gentle stretch in your abdomen and your lower back. ? This will place some of your body weight on your elbows. If this is uncomfortable, try stacking pillows under your chest. ? Your hips should stay down, against the surface that you are lying on. Keep your hip and back muscles relaxed. 4. Hold this position for __________ seconds. 5. Slowly relax your upper body and return to the starting position. Repeat __________ times. Complete this exercise __________ times a day. Strengthening exercises These exercises build strength and endurance in your back. Endurance is the ability to use your muscles for a long time, even after they get tired. Pelvic tilt This exercise strengthens the muscles that lie deep in the abdomen. 1. Lie on your back on a firm surface. Bend your knees and keep your feet flat on the floor. 2. Tense your abdominal muscles. Tip your pelvis up toward the ceiling and flatten your lower back into the floor. ? To help with this exercise, you may place a small towel under your lower back and try to push your back into the towel. 3. Hold this position for __________ seconds. 4. Let your muscles relax completely  before you repeat this exercise. Repeat __________ times. Complete this exercise __________ times a day. Alternating arm and leg raises  1. Get on your hands and knees on a firm surface. If you are on a hard floor, you may want to use padding, such as an exercise mat, to cushion your knees. 2. Line up your arms and legs. Your hands should be directly below your shoulders, and your knees should be directly below your hips. 3. Lift your left leg behind you.  At the same time, raise your right arm and straighten it in front of you. ? Do not lift your leg higher than your hip. ? Do not lift your arm higher than your shoulder. ? Keep your abdominal and back muscles tight. ? Keep your hips facing the ground. ? Do not arch your back. ? Keep your balance carefully, and do not hold your breath. 4. Hold this position for __________ seconds. 5. Slowly return to the starting position. 6. Repeat with your right leg and your left arm. Repeat __________ times. Complete this exercise __________ times a day. Posture and body mechanics Good posture and healthy body mechanics can help to relieve stress in your body's tissues and joints. Body mechanics refers to the movements and positions of your body while you do your daily activities. Posture is part of body mechanics. Good posture means:  Your spine is in its natural S-curve position (neutral).  Your shoulders are pulled back slightly.  Your head is not tipped forward. Follow these guidelines to improve your posture and body mechanics in your everyday activities. Standing   When standing, keep your spine neutral and your feet about hip width apart. Keep a slight bend in your knees. Your ears, shoulders, and hips should line up.  When you do a task in which you stand in one place for a long time, place one foot up on a stable object that is 2-4 inches (5-10 cm) high, such as a footstool. This helps keep your spine neutral. Sitting   When sitting, keep your spine neutral and keep your feet flat on the floor. Use a footrest, if necessary, and keep your thighs parallel to the floor. Avoid rounding your shoulders, and avoid tilting your head forward.  When working at a desk or a computer, keep your desk at a height where your hands are slightly lower than your elbows. Slide your chair under your desk so you are close enough to maintain good posture.  When working at a computer, place your monitor at a  height where you are looking straight ahead and you do not have to tilt your head forward or downward to look at the screen. Resting  When lying down and resting, avoid positions that are most painful for you.  If you have pain with activities such as sitting, bending, stooping, or squatting, lie in a position in which your body does not bend very much. For example, avoid curling up on your side with your arms and knees near your chest (fetal position).  If you have pain with activities such as standing for a long time or reaching with your arms, lie with your spine in a neutral position and bend your knees slightly. Try the following positions: ? Lying on your side with a pillow between your knees. ? Lying on your back with a pillow under your knees. Lifting   When lifting objects, keep your feet at least shoulder width apart and tighten your abdominal muscles.  Bend your  knees and hips and keep your spine neutral. It is important to lift using the strength of your legs, not your back. Do not lock your knees straight out.  Always ask for help to lift heavy or awkward objects. This information is not intended to replace advice given to you by your health care provider. Make sure you discuss any questions you have with your health care provider. Document Revised: 08/11/2018 Document Reviewed: 05/11/2018 Elsevier Patient Education  [place].

## 2019-07-16 NOTE — Progress Notes (Signed)
Richard Beltran - 54 y.o. male MRN [id number]  Date of birth: 05-Dec-1965  Subjective Chief Complaint  Patient presents with  . Back Pain    HPI Richard Beltran is a 54 y.o. male with history of chronic low back pain s/p L4-L5 fusion here today with complaint of R sided back pain.  He reports that pain started 3-4 weeks ago.  It is accompanied by radiation into the back of the R leg.   He has tried to manage at home but pain has persisted.  He does take tramadol daily and this has not relieved his symptoms much.  He denies weakness into the leg, sensation of numbness or tingling.  He has not had any problems with bowels or bladder.   ROS:  A comprehensive ROS was completed and negative except as noted per HPI  Allergies  Allergen Reactions  . Eggs Or Egg-Derived Products Nausea And Vomiting    Past Medical History:  Diagnosis Date  . Hypertension   . Lumbar degenerative disc disease 10/29/2013   Post L4-L5 fusion.   . Male hypogonadism     Past Surgical History:  Procedure Laterality Date  . BACK SURGERY      Social History   Socioeconomic History  . Marital status: Married    Spouse name: Not on file  . Number of children: 4  . Years of education: 18+  . Highest education level: Not on file  Occupational History  . Occupation: Database administrator  Tobacco Use  . Smoking status: Never Smoker  . Smokeless tobacco: Never Used  Substance and Sexual Activity  . Alcohol use: No  . Drug use: No  . Sexual activity: Not on file  Other Topics Concern  . Not on file  Social History Narrative   Fun: Golf and read   Denies religious beliefs effecting healthcare.    Social Determinants of Health   Financial Resource Strain:   . Difficulty of Paying Living Expenses:   Food Insecurity:   . Worried About Charity fundraiser in the Last Year:   . Arboriculturist in the Last Year:   Transportation Needs:   . Film/video editor (Medical):   Marland Kitchen Lack of Transportation (Non-Medical):    Physical Activity:   . Days of Exercise per Week:   . Minutes of Exercise per Session:   Stress:   . Feeling of Stress :   Social Connections:   . Frequency of Communication with Friends and Family:   . Frequency of Social Gatherings with Friends and Family:   . Attends Religious Services:   . Active Member of Clubs or Organizations:   . Attends Archivist Meetings:   Marland Kitchen Marital Status:     Family History  Problem Relation Age of Onset  . Diabetes Mother   . Hyperlipidemia Mother   . Healthy Father   . Healthy Maternal Grandmother   . Healthy Maternal Grandfather   . Healthy Paternal Grandmother   . Healthy Paternal Grandfather     Health Maintenance  Topic Date Due  . COLONOSCOPY  Never done  . TETANUS/TDAP  03/27/2024  . HIV Screening  Completed     ----------------------------------------------------------------------------------------------------------------------------------------------------------------------------------------------------------------- Physical Exam BP 117/67   Pulse 68   Temp 97.6 F (36.4 C) (Oral)   Ht 6\' 1"  (1.854 m)   Wt 272 lb (123.4 kg)   SpO2 98% Comment: on RA  BMI 35.89 kg/m   Physical Exam Constitutional:  Appearance: Normal appearance.  Cardiovascular:     Rate and Rhythm: Normal rate and regular rhythm.  Pulmonary:     Effort: Pulmonary effort is normal.     Breath sounds: Normal breath sounds.  Musculoskeletal:     Comments: ROM of lumbar spine limited in flexion, L sidebending and rotational movement.  He has good strength and normal gait.  Reflexes slightly diminished on R compared to L.  +SLR on R  Skin:    General: Skin is warm and dry.  Neurological:     General: No focal deficit present.     Mental Status: He is alert.  Psychiatric:        Mood and Affect: Mood normal.      ------------------------------------------------------------------------------------------------------------------------------------------------------------------------------------------------------------------- Assessment and Plan  Sciatica associated with disorder of lumbar spine Start course of steroids and flexeril.  He may continue tramadol.  Cautioned about sedation with flexeril and tramadol.  Given handout of stretches to complete at home.  If not improving with above will refer to PT and/or obtain MRI.     Meds ordered this encounter  Medications  . methylPREDNISolone (MEDROL) 4 MG TBPK tablet    Sig: Taper as directed on packaging.    Dispense:  21 tablet    Refill:  0  . cyclobenzaprine (FLEXERIL) 10 MG tablet    Sig: Take 1 tablet (10 mg total) by mouth 3 (three) times daily as needed for muscle spasms.    Dispense:  30 tablet    Refill:  0    No follow-ups on file.    This visit occurred during the SARS-CoV-2 public health emergency.  Safety protocols were in place, including screening questions prior to the visit, additional usage of staff PPE, and extensive cleaning of exam room while observing appropriate contact time as indicated for disinfecting solutions.

## 2019-07-16 NOTE — Assessment & Plan Note (Signed)
Start course of steroids and flexeril.  He may continue tramadol.  Cautioned about sedation with flexeril and tramadol.  Given handout of stretches to complete at home.  If not improving with above will refer to PT and/or obtain MRI.

## 2019-07-19 ENCOUNTER — Ambulatory Visit (INDEPENDENT_AMBULATORY_CARE_PROVIDER_SITE_OTHER): Payer: Self-pay | Admitting: Sports Medicine

## 2019-07-19 ENCOUNTER — Other Ambulatory Visit: Payer: Self-pay

## 2019-07-19 VITALS — BP 131/74

## 2019-07-19 DIAGNOSIS — E291 Testicular hypofunction: Secondary | ICD-10-CM

## 2019-07-19 MED ORDER — TESTOSTERONE CYPIONATE 200 MG/ML IM SOLN
200.0000 mg | Freq: Once | INTRAMUSCULAR | Status: AC
  Administered 2019-07-19: 200 mg via INTRAMUSCULAR

## 2019-07-19 NOTE — Progress Notes (Signed)
HPI:  Patient is here for a testosterone injection.  Denies chest pains, shortness of breath, headaches and problems with medication or mood changes.  Assessment and Plan:  Patient tolerated injection well without complications.  Patient advised to schedule next injection in 14 days.  Charyl Bigger, CMA

## 2019-08-02 ENCOUNTER — Ambulatory Visit (INDEPENDENT_AMBULATORY_CARE_PROVIDER_SITE_OTHER): Payer: Self-pay | Admitting: Sports Medicine

## 2019-08-02 VITALS — BP 142/82 | HR 70

## 2019-08-02 DIAGNOSIS — E291 Testicular hypofunction: Secondary | ICD-10-CM

## 2019-08-02 DIAGNOSIS — M51369 Other intervertebral disc degeneration, lumbar region without mention of lumbar back pain or lower extremity pain: Secondary | ICD-10-CM

## 2019-08-02 DIAGNOSIS — M5136 Other intervertebral disc degeneration, lumbar region: Secondary | ICD-10-CM

## 2019-08-02 MED ORDER — TRAMADOL HCL 50 MG PO TABS: ORAL_TABLET | ORAL | 0 refills | Status: DC

## 2019-08-02 MED ORDER — TESTOSTERONE CYPIONATE 200 MG/ML IM SOLN
200.0000 mg | Freq: Once | INTRAMUSCULAR | Status: AC
  Administered 2019-08-02: 200 mg via INTRAMUSCULAR

## 2019-08-02 NOTE — Progress Notes (Signed)
Patient is here for testosterone injection. Denies chest pain, shortness of breath, headaches, and problems with medication or mood changes.   Patient tolerated 200 mg testosterone injection to LUOQ well without complications. Patient advised to schedule next injection in 14 days.   Patient asking for refill of Tramadol, RX pended.

## 2019-08-03 ENCOUNTER — Other Ambulatory Visit: Payer: Self-pay | Admitting: Sports Medicine

## 2019-08-16 ENCOUNTER — Other Ambulatory Visit: Payer: Self-pay

## 2019-08-16 ENCOUNTER — Ambulatory Visit (INDEPENDENT_AMBULATORY_CARE_PROVIDER_SITE_OTHER): Payer: Self-pay | Admitting: Sports Medicine

## 2019-08-16 VITALS — BP 145/75 | HR 70 | Ht 73.0 in | Wt 275.0 lb

## 2019-08-16 DIAGNOSIS — I10 Essential (primary) hypertension: Secondary | ICD-10-CM

## 2019-08-16 DIAGNOSIS — E291 Testicular hypofunction: Secondary | ICD-10-CM

## 2019-08-16 MED ORDER — TESTOSTERONE CYPIONATE 200 MG/ML IM SOLN
200.0000 mg | Freq: Once | INTRAMUSCULAR | Status: AC
  Administered 2019-08-16: 200 mg via INTRAMUSCULAR

## 2019-08-16 NOTE — Progress Notes (Signed)
Patient made aware. He will have labs before next visit (in a week). Note written on next visit to make sure complete before he gets another testosterone injection.

## 2019-08-16 NOTE — Progress Notes (Signed)
Patient is here for testosterone injection. Denies chest pain, shortness of breath, headaches, and problems with medication or mood changes.   Patient tolerated testosterone 200 mg injection to RUOQ well without complications. Patient advised to schedule next injection in 14 days.

## 2019-08-16 NOTE — Assessment & Plan Note (Signed)
Richard Beltran got a testosterone injection today, he has not gotten labs in a couple of years, at this point I think we should hold off on further treatment until he gets his labs done.

## 2019-08-20 ENCOUNTER — Ambulatory Visit (INDEPENDENT_AMBULATORY_CARE_PROVIDER_SITE_OTHER): Payer: Self-pay | Admitting: Family Medicine

## 2019-08-20 ENCOUNTER — Other Ambulatory Visit: Payer: Self-pay

## 2019-08-20 ENCOUNTER — Encounter: Payer: Self-pay | Admitting: Family Medicine

## 2019-08-20 ENCOUNTER — Ambulatory Visit (INDEPENDENT_AMBULATORY_CARE_PROVIDER_SITE_OTHER): Payer: Self-pay

## 2019-08-20 VITALS — BP 146/86 | HR 67 | Temp 98.1°F | Ht 73.0 in | Wt 272.0 lb

## 2019-08-20 DIAGNOSIS — M545 Low back pain, unspecified: Secondary | ICD-10-CM

## 2019-08-20 DIAGNOSIS — M5136 Other intervertebral disc degeneration, lumbar region: Secondary | ICD-10-CM

## 2019-08-20 LAB — POCT URINALYSIS DIP (CLINITEK)
Bilirubin, UA: NEGATIVE
Blood, UA: NEGATIVE
Glucose, UA: NEGATIVE mg/dL
Leukocytes, UA: NEGATIVE
Nitrite, UA: NEGATIVE
POC PROTEIN,UA: NEGATIVE
Spec Grav, UA: 1.025 (ref 1.010–1.025)
Urobilinogen, UA: 0.2 E.U./dL
pH, UA: 6 (ref 5.0–8.0)

## 2019-08-20 MED ORDER — METHOCARBAMOL 750 MG PO TABS: 750.0000 mg | ORAL_TABLET | Freq: Four times a day (QID) | ORAL | 1 refills | Status: DC

## 2019-08-20 MED ORDER — KETOROLAC TROMETHAMINE 60 MG/2ML IM SOLN
60.0000 mg | Freq: Once | INTRAMUSCULAR | Status: AC
  Administered 2019-08-20: 60 mg via INTRAMUSCULAR

## 2019-08-20 MED ORDER — HYDROCODONE-ACETAMINOPHEN 5-325 MG PO TABS: 1.0000 | ORAL_TABLET | Freq: Four times a day (QID) | ORAL | 0 refills | Status: DC | PRN

## 2019-08-20 NOTE — Progress Notes (Signed)
Richard Beltran - 54 y.o. male MRN [id number]  Date of birth: December 05, 1965  Subjective Chief Complaint  Patient presents with  . Back Pain    HPI Richard Beltran is a 54 y.o. male with history of chronic lumbar back pain s/p L4-L5 fusion here today with complaint of continued back pain.  I saw him about 1 month ago for similar complaint however he had radiation of pain in to the R leg at that time.  Today complaining of pain limited to lower back without radiation. ROM limited due to pain, having difficulty getting dressed and tying shoes  He was treated previously with medrol dosepak and flexeril.  He reports improvement in sciatica but not much improvement in back pain with this.  He is also taking tramadol and meloxicam which has not helped very much with this.  He has not had fever, chills, dysuria, or frequency.     Allergies  Allergen Reactions  . Eggs Or Egg-Derived Products Nausea And Vomiting    Past Medical History:  Diagnosis Date  . Hypertension   . Lumbar degenerative disc disease 10/29/2013   Post L4-L5 fusion.   . Male hypogonadism     Past Surgical History:  Procedure Laterality Date  . BACK SURGERY      Social History   Socioeconomic History  . Marital status: Married    Spouse name: Not on file  . Number of children: 4  . Years of education: 18+  . Highest education level: Not on file  Occupational History  . Occupation: Database administrator  Tobacco Use  . Smoking status: Never Smoker  . Smokeless tobacco: Never Used  Substance and Sexual Activity  . Alcohol use: No  . Drug use: No  . Sexual activity: Not on file  Other Topics Concern  . Not on file  Social History Narrative   Fun: Golf and read   Denies religious beliefs effecting healthcare.    Social Determinants of Health   Financial Resource Strain:   . Difficulty of Paying Living Expenses:   Food Insecurity:   . Worried About Charity fundraiser in the Last Year:   . Arboriculturist in the Last  Year:   Transportation Needs:   . Film/video editor (Medical):   Marland Kitchen Lack of Transportation (Non-Medical):   Physical Activity:   . Days of Exercise per Week:   . Minutes of Exercise per Session:   Stress:   . Feeling of Stress :   Social Connections:   . Frequency of Communication with Friends and Family:   . Frequency of Social Gatherings with Friends and Family:   . Attends Religious Services:   . Active Member of Clubs or Organizations:   . Attends Archivist Meetings:   Marland Kitchen Marital Status:     Family History  Problem Relation Age of Onset  . Diabetes Mother   . Hyperlipidemia Mother   . Healthy Father   . Healthy Maternal Grandmother   . Healthy Maternal Grandfather   . Healthy Paternal Grandmother   . Healthy Paternal Grandfather     Health Maintenance  Topic Date Due  . COLONOSCOPY  Never done  . TETANUS/TDAP  03/27/2024  . COVID-19 Vaccine  Completed  . HIV Screening  Completed     ----------------------------------------------------------------------------------------------------------------------------------------------------------------------------------------------------------------- Physical Exam BP (!) 146/86   Pulse 67   Temp 98.1 F (36.7 C) (Oral)   Ht 6\' 1"  (1.854 m)   Wt 272 lb (123.4 kg)  BMI 35.89 kg/m   Physical Exam Constitutional:      Appearance: Normal appearance.  HENT:     Head: Normocephalic and atraumatic.  Musculoskeletal:     Comments: Walks with slow gait, stiff posture.  ROM limited, pain worse with flexion of L spine    Neurological:     General: No focal deficit present.     Mental Status: He is alert.  Psychiatric:        Mood and Affect: Mood normal.        Behavior: Behavior normal.      ------------------------------------------------------------------------------------------------------------------------------------------------------------------------------------------------------------------- Assessment and Plan  Lumbar degenerative disc disease Acute on chronic back pain.   Xrays of lumbar spine ordered given worsening over the past month.   Toradol 60mg  given today.  Change flexeril to robaxin and add norco 5/325mg .  He is instructed to not use tramadol in combination with this. Referral to PT.  Red flags reviewed.        Meds ordered this encounter  Medications  . methocarbamol (ROBAXIN-750) 750 MG tablet    Sig: Take 1 tablet (750 mg total) by mouth 4 (four) times daily.    Dispense:  90 tablet    Refill:  1  . HYDROcodone-acetaminophen (NORCO) 5-325 MG tablet    Sig: Take 1 tablet by mouth every 6 (six) hours as needed for severe pain.    Dispense:  12 tablet    Refill:  0    No follow-ups on file.    This visit occurred during the SARS-CoV-2 public health emergency.  Safety protocols were in place, including screening questions prior to the visit, additional usage of staff PPE, and extensive cleaning of exam room while observing appropriate contact time as indicated for disinfecting solutions.

## 2019-08-20 NOTE — Assessment & Plan Note (Addendum)
Acute on chronic back pain.   Xrays of lumbar spine ordered given worsening over the past month.   Toradol 60mg  given today.  Change flexeril to robaxin and add norco 5/325mg .  He is instructed to not use tramadol in combination with this. Referral to PT.  Red flags reviewed.

## 2019-08-20 NOTE — Patient Instructions (Addendum)
Have Xray completed.  Start robaxin and hydrocodone as needed for severe pain. Do not take tramadol while taking hydrocodone!  You may continue meloxicam I have ordered course of Physical therapy as well.

## 2019-08-20 NOTE — Addendum Note (Signed)
Addended by: Dessie Coma on: 08/20/2019 12:28 PM   Modules accepted: Orders

## 2019-08-30 ENCOUNTER — Ambulatory Visit (INDEPENDENT_AMBULATORY_CARE_PROVIDER_SITE_OTHER): Payer: Self-pay | Admitting: Sports Medicine

## 2019-08-30 ENCOUNTER — Other Ambulatory Visit: Payer: Self-pay

## 2019-08-30 VITALS — BP 143/73 | HR 64

## 2019-08-30 DIAGNOSIS — E291 Testicular hypofunction: Secondary | ICD-10-CM

## 2019-08-30 MED ORDER — TESTOSTERONE CYPIONATE 200 MG/ML IM SOLN
200.0000 mg | Freq: Once | INTRAMUSCULAR | Status: AC
  Administered 2019-08-30: 200 mg via INTRAMUSCULAR

## 2019-08-30 NOTE — Progress Notes (Signed)
Patient is here for testosterone injection. Denies chest pain, shortness of breath, headaches, and problems with medication or mood changes.   Patient was told after last injection that he needed labs prior to future injections. He still has not had these performed. Patient was given lab requisition and he went downstairs for labs prior to testosterone injection. Made him aware results will be available in mychart or we will call him directly.   Patient tolerated 200 mg testosterone injection to LUOQ well without complications. Patient advised to schedule next injection in 14 days.

## 2019-09-02 LAB — CBC
HCT: 45.2 % (ref 38.5–50.0)
Hemoglobin: 14.6 g/dL (ref 13.2–17.1)
MCH: 29 pg (ref 27.0–33.0)
MCHC: 32.3 g/dL (ref 32.0–36.0)
MCV: 89.9 fL (ref 80.0–100.0)
MPV: 9.9 fL (ref 7.5–12.5)
Platelets: 249 10*3/uL (ref 140–400)
RBC: 5.03 10*6/uL (ref 4.20–5.80)
RDW: 12.3 % (ref 11.0–15.0)
WBC: 6.2 10*3/uL (ref 3.8–10.8)

## 2019-09-02 LAB — COMPLETE METABOLIC PANEL WITH GFR
AG Ratio: 1.8 (calc) (ref 1.0–2.5)
ALT: 16 U/L (ref 9–46)
AST: 17 U/L (ref 10–35)
Albumin: 4.4 g/dL (ref 3.6–5.1)
Alkaline phosphatase (APISO): 56 U/L (ref 35–144)
BUN: 11 mg/dL (ref 7–25)
CO2: 28 mmol/L (ref 20–32)
Calcium: 9.4 mg/dL (ref 8.6–10.3)
Chloride: 103 mmol/L (ref 98–110)
Creat: 1.16 mg/dL (ref 0.70–1.33)
GFR, Est African American: 83 mL/min/{1.73_m2} (ref >=60)
GFR, Est Non African American: 71 mL/min/{1.73_m2} (ref >=60)
Globulin: 2.5 g/dL (calc) (ref 1.9–3.7)
Glucose, Bld: 99 mg/dL (ref 65–139)
Potassium: 4.1 mmol/L (ref 3.5–5.3)
Sodium: 138 mmol/L (ref 135–146)
Total Bilirubin: 0.5 mg/dL (ref 0.2–1.2)
Total Protein: 6.9 g/dL (ref 6.1–8.1)

## 2019-09-02 LAB — LIPID PANEL W/REFLEX DIRECT LDL
Cholesterol: 218 mg/dL — ABNORMAL HIGH (ref <200)
HDL: 37 mg/dL — ABNORMAL LOW (ref >=40)
LDL Cholesterol (Calc): 151 mg/dL (calc) — ABNORMAL HIGH
Non-HDL Cholesterol (Calc): 181 mg/dL (calc) — ABNORMAL HIGH (ref <130)
Total CHOL/HDL Ratio: 5.9 (calc) — ABNORMAL HIGH (ref <5.0)
Triglycerides: 167 mg/dL — ABNORMAL HIGH (ref <150)

## 2019-09-02 LAB — HEMOGLOBIN A1C
Hgb A1c MFr Bld: 4.6 % of total Hgb (ref <5.7)
Mean Plasma Glucose: 85 (calc)
eAG (mmol/L): 4.7 (calc)

## 2019-09-02 LAB — TSH: TSH: 1.31 mIU/L (ref 0.40–4.50)

## 2019-09-02 LAB — TESTOSTERONE, FREE & TOTAL
Free Testosterone: 59.3 pg/mL (ref 35.0–155.0)
Testosterone, Total, LC-MS-MS: 341 ng/dL (ref 250–1100)

## 2019-09-02 LAB — PSA, TOTAL AND FREE
PSA, % Free: 40 % (calc) (ref >25)
PSA, Free: 0.2 ng/mL
PSA, Total: 0.5 ng/mL (ref <=4.0)

## 2019-09-12 ENCOUNTER — Other Ambulatory Visit: Payer: Self-pay | Admitting: Sports Medicine

## 2019-09-12 DIAGNOSIS — M5136 Other intervertebral disc degeneration, lumbar region: Secondary | ICD-10-CM

## 2019-09-13 ENCOUNTER — Encounter: Payer: Self-pay | Admitting: Gastroenterology

## 2019-09-13 ENCOUNTER — Ambulatory Visit (INDEPENDENT_AMBULATORY_CARE_PROVIDER_SITE_OTHER): Payer: Self-pay | Admitting: Sports Medicine

## 2019-09-13 ENCOUNTER — Other Ambulatory Visit: Payer: Self-pay

## 2019-09-13 VITALS — BP 140/85 | HR 90 | Wt 273.0 lb

## 2019-09-13 DIAGNOSIS — E291 Testicular hypofunction: Secondary | ICD-10-CM

## 2019-09-13 DIAGNOSIS — Z1211 Encounter for screening for malignant neoplasm of colon: Secondary | ICD-10-CM

## 2019-09-13 DIAGNOSIS — I10 Essential (primary) hypertension: Secondary | ICD-10-CM

## 2019-09-13 MED ORDER — TESTOSTERONE CYPIONATE 200 MG/ML IM SOLN
200.0000 mg | Freq: Once | INTRAMUSCULAR | Status: AC
  Administered 2019-09-13: 200 mg via INTRAMUSCULAR

## 2019-09-13 NOTE — Progress Notes (Signed)
Established Patient Office Visit  Subjective:  Patient ID: Richard Beltran, male    DOB: 21-Nov-1965  Age: 54 y.o. MRN: [id number]  CC:  Chief Complaint  Patient presents with  . Hypogonadism  . Colon Cancer Screening    HPI  Richard Beltran is here for a testosterone injection. Denies chest pain, shortness of breath, headaches or mood changes.   Colon cancer screening - Placed a referral to GI for a screening colonoscopy.   HTN - First check of blood pressure was elevated. Patient states he has been out of Carvedilol for the last 2 days.   Past Medical History:  Diagnosis Date  . Hypertension   . Lumbar degenerative disc disease 10/29/2013   Post L4-L5 fusion.   . Male hypogonadism     Past Surgical History:  Procedure Laterality Date  . BACK SURGERY      Family History  Problem Relation Age of Onset  . Diabetes Mother   . Hyperlipidemia Mother   . Healthy Father   . Healthy Maternal Grandmother   . Healthy Maternal Grandfather   . Healthy Paternal Grandmother   . Healthy Paternal Grandfather     Social History   Socioeconomic History  . Marital status: Married    Spouse name: Not on file  . Number of children: 4  . Years of education: 18+  . Highest education level: Not on file  Occupational History  . Occupation: Database administrator  Tobacco Use  . Smoking status: Never Smoker  . Smokeless tobacco: Never Used  Substance and Sexual Activity  . Alcohol use: No  . Drug use: No  . Sexual activity: Not on file  Other Topics Concern  . Not on file  Social History Narrative   Fun: Golf and read   Denies religious beliefs effecting healthcare.    Social Determinants of Health   Financial Resource Strain:   . Difficulty of Paying Living Expenses:   Food Insecurity:   . Worried About Charity fundraiser in the Last Year:   . Arboriculturist in the Last Year:   Transportation Needs:   . Film/video editor (Medical):   Marland Kitchen Lack of Transportation  (Non-Medical):   Physical Activity:   . Days of Exercise per Week:   . Minutes of Exercise per Session:   Stress:   . Feeling of Stress :   Social Connections:   . Frequency of Communication with Friends and Family:   . Frequency of Social Gatherings with Friends and Family:   . Attends Religious Services:   . Active Member of Clubs or Organizations:   . Attends Archivist Meetings:   Marland Kitchen Marital Status:   Intimate Partner Violence:   . Fear of Current or Ex-Partner:   . Emotionally Abused:   Marland Kitchen Physically Abused:   . Sexually Abused:     Outpatient Medications Prior to Visit  Medication Sig Dispense Refill  . amLODipine (NORVASC) 10 MG tablet TAKE 1 TABLET BY MOUTH EVERY DAY 90 tablet 1  . HYDROcodone-acetaminophen (NORCO) 5-325 MG tablet Take 1 tablet by mouth every 6 (six) hours as needed for severe pain. 12 tablet 0  . lisinopril-hydrochlorothiazide (ZESTORETIC) 20-25 MG tablet TAKE 1 TABLET BY MOUTH EVERY DAY 90 tablet 1  . meloxicam (MOBIC) 15 MG tablet TAKE 1 TABLET BY MOUTH EVERY DAY AS NEEDED FOR PAIN 90 tablet 1  . methocarbamol (ROBAXIN-750) 750 MG tablet Take 1 tablet (750 mg total) by mouth 4 (  four) times daily. 90 tablet 1  . traMADol (ULTRAM) 50 MG tablet TAKE 1-2 TABLETS BY MOUTH 3 TIMES DAILY AS NEEDED FOR PAIN 180 tablet 0  . carvedilol (COREG) 25 MG tablet TAKE 1 TABLET BY MOUTH 2 TIMES DAILY WITH A MEAL (Patient not taking: Reported on 09/13/2019) 180 tablet 0  . cyclobenzaprine (FLEXERIL) 10 MG tablet Take 1 tablet (10 mg total) by mouth 3 (three) times daily as needed for muscle spasms. 30 tablet 0   No facility-administered medications prior to visit.    Allergies  Allergen Reactions  . Eggs Or Egg-Derived Products Nausea And Vomiting    ROS Review of Systems    Objective:    Physical Exam  BP (!) 154/83   Pulse 90   Wt 273 lb (123.8 kg)   SpO2 100%   BMI 36.02 kg/m  Wt Readings from Last 3 Encounters:  09/13/19 273 lb (123.8 kg)   08/20/19 272 lb (123.4 kg)  08/16/19 275 lb (124.7 kg)     Health Maintenance Due  Topic Date Due  . COLONOSCOPY  Never done    There are no preventive care reminders to display for this patient.  Lab Results  Component Value Date   TSH 1.31 08/30/2019   Lab Results  Component Value Date   WBC 6.2 08/30/2019   HGB 14.6 08/30/2019   HCT 45.2 08/30/2019   MCV 89.9 08/30/2019   PLT 249 08/30/2019   Lab Results  Component Value Date   NA 138 08/30/2019   K 4.1 08/30/2019   CO2 28 08/30/2019   GLUCOSE 99 08/30/2019   BUN 11 08/30/2019   CREATININE 1.16 08/30/2019   BILITOT 0.5 08/30/2019   ALKPHOS 76 12/08/2016   AST 17 08/30/2019   ALT 16 08/30/2019   PROT 6.9 08/30/2019   ALBUMIN 4.2 12/08/2016   CALCIUM 9.4 08/30/2019   Lab Results  Component Value Date   CHOL 218 (H) 08/30/2019   Lab Results  Component Value Date   HDL 37 (L) 08/30/2019   Lab Results  Component Value Date   LDLCALC 151 (H) 08/30/2019   Lab Results  Component Value Date   TRIG 167 (H) 08/30/2019   Lab Results  Component Value Date   CHOLHDL 5.9 (H) 08/30/2019   Lab Results  Component Value Date   HGBA1C 4.6 08/30/2019      Assessment & Plan:  Hypogonadism - Patient tolerated injection well without complications. Patient advised to schedule next injection 14 days from today.   Colon cancer screening - Patient advised a referral has been placed for a colonoscopy to screen for colon cancer.   HTN - Elevated blood pressure on first check. Second check the blood pressure was 140/85. Patient advised to pick up the refill of the Carvedilol today.    Problem List Items Addressed This Visit    Male hypogonadism - Primary    Other Visit Diagnoses    Screening for colon cancer       Relevant Orders   Ambulatory referral to Gastroenterology      Meds ordered this encounter  Medications  . testosterone cypionate (DEPOTESTOSTERONE CYPIONATE) injection 200 mg    Follow-up:  Return in about 2 weeks (around 09/27/2019) for testosterone injection. Durene Romans, Monico Blitz, [place]

## 2019-09-27 ENCOUNTER — Ambulatory Visit (INDEPENDENT_AMBULATORY_CARE_PROVIDER_SITE_OTHER): Payer: Self-pay | Admitting: Sports Medicine

## 2019-09-27 VITALS — BP 130/76 | HR 69 | Ht 73.0 in | Wt 276.0 lb

## 2019-09-27 DIAGNOSIS — E291 Testicular hypofunction: Secondary | ICD-10-CM

## 2019-09-27 MED ORDER — TESTOSTERONE CYPIONATE 200 MG/ML IM SOLN
200.0000 mg | Freq: Once | INTRAMUSCULAR | Status: AC
  Administered 2019-09-27: 200 mg via INTRAMUSCULAR

## 2019-09-27 NOTE — Progress Notes (Signed)
   Subjective:    Patient ID: Richard Beltran, male    DOB: 1966-04-08, 54 y.o.   MRN: [id number]  HPI Patient is here for a testosterone injection. Denies chest pain, shortness of breath, headaches, problems with mood change, or medication problems.   Review of Systems     Objective:   Physical Exam        Assessment & Plan:  Initial blood pressure was elevated at 148/85. Patient was left in room for 10 minutes and blood pressure recheck ws 130/76. Patient tolerated injection in [place] well without complications. Patient advised to schedule his next injection for 2 weeks from today.  HM: Patient was advised he needed colonoscopy and this was ordered at last visit. I gave him information from referral and their contact number. Patient will call and get scheduled.

## 2019-10-11 ENCOUNTER — Ambulatory Visit (INDEPENDENT_AMBULATORY_CARE_PROVIDER_SITE_OTHER): Payer: 59 | Admitting: Sports Medicine

## 2019-10-11 ENCOUNTER — Other Ambulatory Visit: Payer: Self-pay

## 2019-10-11 VITALS — BP 139/79 | HR 68 | Temp 97.0°F | Wt 280.0 lb

## 2019-10-11 DIAGNOSIS — E291 Testicular hypofunction: Secondary | ICD-10-CM

## 2019-10-11 MED ORDER — TESTOSTERONE CYPIONATE 200 MG/ML IM SOLN
200.0000 mg | Freq: Once | INTRAMUSCULAR | Status: AC
  Administered 2019-10-11: 200 mg via INTRAMUSCULAR

## 2019-10-11 NOTE — Progress Notes (Signed)
Pt. Here for testosterone injection, pt tolerated well. Injection given in RUQ. No SOB, chest pain or mood swings.

## 2019-10-15 ENCOUNTER — Other Ambulatory Visit: Payer: Self-pay | Admitting: Sports Medicine

## 2019-10-15 DIAGNOSIS — M5136 Other intervertebral disc degeneration, lumbar region: Secondary | ICD-10-CM

## 2019-10-17 ENCOUNTER — Ambulatory Visit (AMBULATORY_SURGERY_CENTER): Payer: Self-pay | Admitting: *Deleted

## 2019-10-17 ENCOUNTER — Other Ambulatory Visit: Payer: Self-pay

## 2019-10-17 VITALS — Ht 73.0 in | Wt 279.0 lb

## 2019-10-17 DIAGNOSIS — Z1211 Encounter for screening for malignant neoplasm of colon: Secondary | ICD-10-CM

## 2019-10-17 MED ORDER — NA SULFATE-K SULFATE-MG SULF 17.5-3.13-1.6 GM/177ML PO SOLN: ORAL | 0 refills | Status: DC

## 2019-10-17 NOTE — Progress Notes (Signed)
Patient is here in-person for PV. Patient is allergic to EGGS! Patient denies any allergies to soy. Patient denies any problems with anesthesia/sedation. Patient denies any oxygen use at home. Patient denies taking any diet/weight loss medications or blood thinners. Patient is not being treated for MRSA or C-diff. Patient is aware of our care-partner policy and DVIPN-24 safety protocol. EMMI education assisgned to the patient for the procedure, this was explained and instructions given to patient.   Completed covid vaccines per pt on 08/14/19. SuPrep Prescription coupon given to the patient.

## 2019-10-23 ENCOUNTER — Encounter: Payer: Self-pay | Admitting: Gastroenterology

## 2019-10-26 ENCOUNTER — Ambulatory Visit (INDEPENDENT_AMBULATORY_CARE_PROVIDER_SITE_OTHER): Payer: 59 | Admitting: Physician Assistant

## 2019-10-26 VITALS — BP 141/73 | HR 68 | Wt 285.0 lb

## 2019-10-26 DIAGNOSIS — E291 Testicular hypofunction: Secondary | ICD-10-CM

## 2019-10-26 MED ORDER — TESTOSTERONE CYPIONATE 200 MG/ML IM SOLN
200.0000 mg | Freq: Once | INTRAMUSCULAR | Status: AC
  Administered 2019-10-26: 200 mg via INTRAMUSCULAR

## 2019-10-26 NOTE — Progress Notes (Signed)
Patient ID: RIAD WAGLEY, male   DOB: 1965/09/28, 54 y.o.   MRN: [id number] Agree with above plan.

## 2019-10-26 NOTE — Progress Notes (Signed)
Pt here for testosterone injection no SOB,CP or mood swings. Injection tolerated well given in LUOQ. Pt to RTC in 2 weeks for next injection

## 2019-10-29 ENCOUNTER — Other Ambulatory Visit: Payer: Self-pay | Admitting: Sports Medicine

## 2019-10-30 ENCOUNTER — Other Ambulatory Visit: Payer: Self-pay | Admitting: Sports Medicine

## 2019-11-06 ENCOUNTER — Other Ambulatory Visit: Payer: Self-pay | Admitting: Sports Medicine

## 2019-11-08 ENCOUNTER — Ambulatory Visit (INDEPENDENT_AMBULATORY_CARE_PROVIDER_SITE_OTHER): Payer: 59 | Admitting: Sports Medicine

## 2019-11-08 VITALS — BP 137/81 | HR 79

## 2019-11-08 DIAGNOSIS — E291 Testicular hypofunction: Secondary | ICD-10-CM | POA: Diagnosis not present

## 2019-11-08 MED ORDER — TESTOSTERONE CYPIONATE 200 MG/ML IM SOLN
200.0000 mg | Freq: Once | INTRAMUSCULAR | Status: AC
  Administered 2019-11-08: 200 mg via INTRAMUSCULAR

## 2019-11-08 NOTE — Progress Notes (Signed)
Patient is here for testosterone injection. Denies chest pain, shortness of breath, headaches, and problems with medication or mood changes.   Patient tolerated testosterone 200 mg injection to RUOQ well without complications. Patient advised to schedule next injection in 14 days.

## 2019-11-22 ENCOUNTER — Ambulatory Visit (INDEPENDENT_AMBULATORY_CARE_PROVIDER_SITE_OTHER): Payer: 59 | Admitting: Sports Medicine

## 2019-11-22 ENCOUNTER — Other Ambulatory Visit: Payer: Self-pay

## 2019-11-22 VITALS — BP 135/78 | HR 71

## 2019-11-22 DIAGNOSIS — E291 Testicular hypofunction: Secondary | ICD-10-CM | POA: Diagnosis not present

## 2019-11-22 DIAGNOSIS — M5136 Other intervertebral disc degeneration, lumbar region: Secondary | ICD-10-CM

## 2019-11-22 DIAGNOSIS — M51369 Other intervertebral disc degeneration, lumbar region without mention of lumbar back pain or lower extremity pain: Secondary | ICD-10-CM

## 2019-11-22 MED ORDER — TRAMADOL HCL 50 MG PO TABS: 50.0000 mg | ORAL_TABLET | Freq: Three times a day (TID) | ORAL | 0 refills | Status: DC | PRN

## 2019-11-22 MED ORDER — TESTOSTERONE CYPIONATE 200 MG/ML IM SOLN
200.0000 mg | Freq: Once | INTRAMUSCULAR | Status: AC
  Administered 2019-11-22: 200 mg via INTRAMUSCULAR

## 2019-11-22 NOTE — Progress Notes (Signed)
Patient is here for testosterone injection. Denies chest pain, shortness of breath, headaches, and problems with medication or mood changes.   Patient tolerated testosterone 200 mg injection to LUOQ well without complications. Patient advised to schedule next injection in 14 days.   Patient also requested refill of Tramadol. Last filled 10/15/2019, #180 with no refills. Will send to Dr. Darene Lamer for approval.

## 2019-12-03 ENCOUNTER — Encounter: Payer: Self-pay | Admitting: Gastroenterology

## 2019-12-03 ENCOUNTER — Other Ambulatory Visit: Payer: Self-pay

## 2019-12-03 ENCOUNTER — Ambulatory Visit (AMBULATORY_SURGERY_CENTER): Payer: 59 | Admitting: Gastroenterology

## 2019-12-03 VITALS — BP 137/71 | HR 56 | Temp 97.1°F | Resp 16 | Ht 73.0 in | Wt 279.0 lb

## 2019-12-03 DIAGNOSIS — Z1211 Encounter for screening for malignant neoplasm of colon: Secondary | ICD-10-CM

## 2019-12-03 DIAGNOSIS — D123 Benign neoplasm of transverse colon: Secondary | ICD-10-CM | POA: Diagnosis not present

## 2019-12-03 DIAGNOSIS — D122 Benign neoplasm of ascending colon: Secondary | ICD-10-CM

## 2019-12-03 NOTE — Progress Notes (Signed)
Pt's states no medical or surgical changes since previsit or office visit. 

## 2019-12-03 NOTE — Progress Notes (Signed)
Called to room to assist during endoscopic procedure.  Patient ID and intended procedure confirmed with present staff. Received instructions for my participation in the procedure from the performing physician.  

## 2019-12-03 NOTE — Progress Notes (Signed)
To PACU, VSS. Report to [email or web address] 

## 2019-12-03 NOTE — Op Note (Signed)
[facility] Patient Name: Richard Beltran Procedure Date: 12/03/2019 2:13 PM MRN: [id number] Endoscopist: Jackquline Denmark , MD Age: 54 Referring MD:  Date of Birth: [birth year] Gender: Male Account #: [id number] Procedure:                Colonoscopy Indications:              Screening for colorectal malignant neoplasm Medicines:                Monitored Anesthesia Care Procedure:                Pre-Anesthesia Assessment:                           - Prior to the procedure, a History and Physical                            was performed, and patient medications and                            allergies were reviewed. The patient's tolerance of                            previous anesthesia was also reviewed. The risks                            and benefits of the procedure and the sedation                            options and risks were discussed with the patient.                            All questions were answered, and informed consent                            was obtained. Prior Anticoagulants: The patient has                            taken no previous anticoagulant or antiplatelet                            agents. ASA Grade Assessment: II - A patient with                            mild systemic disease. After reviewing the risks                            and benefits, the patient was deemed in                            satisfactory condition to undergo the procedure.                           After obtaining informed consent, the colonoscope  was passed under direct vision. Throughout the                            procedure, the patient's blood pressure, pulse, and                            oxygen saturations were monitored continuously. The                            Colonoscope was introduced through the anus and                            advanced to the the cecum, identified by                            appendiceal orifice and  ileocecal valve. The                            colonoscopy was performed without difficulty. The                            patient tolerated the procedure well. The quality                            of the bowel preparation was good. The ileocecal                            valve, appendiceal orifice, and rectum were                            photographed. Scope In: 2:25:06 PM Scope Out: 2:40:09 PM Scope Withdrawal Time: 0 hours 10 minutes 10 seconds  Total Procedure Duration: 0 hours 15 minutes 3 seconds  Findings:                 Five sessile polyps were found in the proximal                            transverse colon (1), mid transverse colon (2) and                            ascending colon (2). The polyps were 4 to 6 mm in                            size. These polyps were removed with a cold snare.                            Resection and retrieval were complete.                           A few small-mouthed diverticula were found in the                            sigmoid colon.  Non-bleeding internal hemorrhoids were found during                            retroflexion. The hemorrhoids were small.                           The exam was otherwise without abnormality on                            direct and retroflexion views. Complications:            No immediate complications. Estimated Blood Loss:     Estimated blood loss: none. Impression:               - Five 4 to 6 mm polyps in the proximal transverse                            colon, in the mid transverse colon and in the                            ascending colon, removed with a cold snare.                            Resected and retrieved.                           - Mild sigmoid diverticulosis.                           - Non-bleeding internal hemorrhoids.                           - The examination was otherwise normal on direct                            and retroflexion  views. Recommendation:           - Patient has a contact number available for                            emergencies. The signs and symptoms of potential                            delayed complications were discussed with the                            patient. Return to normal activities tomorrow.                            Written discharge instructions were provided to the                            patient.                           - High fiber diet.                           -  Continue present medications.                           - Await pathology results.                           - Repeat colonoscopy for surveillance based on                            pathology results.                           - The findings and recommendations were discussed                            with the patient's family. Jackquline Denmark, MD 12/03/2019 2:48:02 PM This report has been signed electronically.

## 2019-12-03 NOTE — Patient Instructions (Addendum)
YOU HAD AN ENDOSCOPIC PROCEDURE TODAY AT THE  ENDOSCOPY CENTER:   Refer to the procedure report that was given to you for any specific questions about what was found during the examination.  If the procedure report does not answer your questions, please call your gastroenterologist to clarify.  If you requested that your care partner not be given the details of your procedure findings, then the procedure report has been included in a sealed envelope for you to review at your convenience later.  YOU SHOULD EXPECT: Some feelings of bloating in the abdomen. Passage of more gas than usual.  Walking can help get rid of the air that was put into your GI tract during the procedure and reduce the bloating. If you had a lower endoscopy (such as a colonoscopy or flexible sigmoidoscopy) you may notice spotting of blood in your stool or on the toilet paper. If you underwent a bowel prep for your procedure, you may not have a normal bowel movement for a few days.  Please Note:  You might notice some irritation and congestion in your nose or some drainage.  This is from the oxygen used during your procedure.  There is no need for concern and it should clear up in a day or so.  SYMPTOMS TO REPORT IMMEDIATELY:   Following lower endoscopy (colonoscopy or flexible sigmoidoscopy):  Excessive amounts of blood in the stool  Significant tenderness or worsening of abdominal pains  Swelling of the abdomen that is new, acute  Fever of 100F or higher    For urgent or emergent issues, a gastroenterologist can be reached at any hour by calling [phone]. Do not use MyChart messaging for urgent concerns.    DIET:  We do recommend a small meal at first, but then you may proceed to your regular diet.  Drink plenty of fluids but you should avoid alcoholic beverages for 24 hours.  ACTIVITY:  You should plan to take it easy for the rest of today and you should NOT DRIVE or use heavy machinery until tomorrow  (because of the sedation medicines used during the test).    FOLLOW UP: Our staff will call the number listed on your records 48-72 hours following your procedure to check on you and address any questions or concerns that you may have regarding the information given to you following your procedure. If we do not reach you, we will leave a message.  We will attempt to reach you two times.  During this call, we will ask if you have developed any symptoms of COVID 19. If you develop any symptoms (ie: fever, flu-like symptoms, shortness of breath, cough etc.) before then, please call [phone].  If you test positive for Covid 19 in the 2 weeks post procedure, please call and report this information to us.    If any biopsies were taken you will be contacted by phone or by letter within the next 1-3 weeks.  Please call us at [phone] if you have not heard about the biopsies in 3 weeks.    SIGNATURES/CONFIDENTIALITY: You and/or your care partner have signed paperwork which will be entered into your electronic medical record.  These signatures attest to the fact that that the information above on your After Visit Summary has been reviewed and is understood.  Full responsibility of the confidentiality of this discharge information lies with you and/or your care-partner.   Resume medications. Information given on polyps,diverticulosis and high fiber diet. 

## 2019-12-05 ENCOUNTER — Telehealth: Payer: Self-pay

## 2019-12-05 ENCOUNTER — Telehealth: Payer: Self-pay | Admitting: *Deleted

## 2019-12-05 NOTE — Telephone Encounter (Signed)
  Follow up Call-  Call back number 12/03/2019  Post procedure Call Back phone  # [phone]  Permission to leave phone message No  Some recent data might be hidden     Patient questions:  Do you have a fever, pain , or abdominal swelling? No. Pain Score  0 *  Have you tolerated food without any problems? Yes.    Have you been able to return to your normal activities? Yes.    Do you have any questions about your discharge instructions: Diet   No. Medications  No. Follow up visit  No.  Do you have questions or concerns about your Care? No.  Actions: * If pain score is 4 or above: No action needed, pain <4. 1. Have you developed a fever since your procedure? n0  2.   Have you had an respiratory symptoms (SOB or cough) since your procedure? n0  3.   Have you tested positive for COVID 19 since your procedure n0  4.   Have you had any family members/close contacts diagnosed with the COVID 19 since your procedure?  n0   If yes to any of these questions please route to Joylene John, RN and Erenest Rasher, RN

## 2019-12-05 NOTE — Telephone Encounter (Signed)
Follow up call made. No message machine set up.

## 2019-12-06 ENCOUNTER — Other Ambulatory Visit: Payer: Self-pay

## 2019-12-06 ENCOUNTER — Ambulatory Visit (INDEPENDENT_AMBULATORY_CARE_PROVIDER_SITE_OTHER): Payer: 59 | Admitting: Sports Medicine

## 2019-12-06 VITALS — BP 140/81 | HR 62

## 2019-12-06 DIAGNOSIS — E291 Testicular hypofunction: Secondary | ICD-10-CM

## 2019-12-06 MED ORDER — TESTOSTERONE CYPIONATE 200 MG/ML IM SOLN
200.0000 mg | Freq: Once | INTRAMUSCULAR | Status: AC
  Administered 2019-12-06: 200 mg via INTRAMUSCULAR

## 2019-12-06 NOTE — Progress Notes (Signed)
Patient is here for testosterone injection. Denies chest pain, shortness of breath, headaches, and problems with medication or mood changes.   Patient tolerated testosterone 200 mg injection to RUOQ well without complications. Patient advised to schedule next injection in 14 days.

## 2019-12-18 ENCOUNTER — Encounter: Payer: Self-pay | Admitting: Gastroenterology

## 2019-12-20 ENCOUNTER — Ambulatory Visit (INDEPENDENT_AMBULATORY_CARE_PROVIDER_SITE_OTHER): Payer: 59 | Admitting: Sports Medicine

## 2019-12-20 VITALS — BP 122/72 | HR 72 | Temp 98.6°F | Wt 282.0 lb

## 2019-12-20 DIAGNOSIS — E291 Testicular hypofunction: Secondary | ICD-10-CM | POA: Diagnosis not present

## 2019-12-20 MED ORDER — TESTOSTERONE CYPIONATE 200 MG/ML IM SOLN
200.0000 mg | Freq: Once | INTRAMUSCULAR | Status: AC
  Administered 2019-12-20: 200 mg via INTRAMUSCULAR

## 2019-12-20 NOTE — Progress Notes (Signed)
Patient is here for testosterone injection / 200 mg. Denies chest pain, SOB, headaches, and problems with medication or mood changes. Patient tolerated injection well on LUOQ without complications. Patient advised to schedule next injection in 14 days.

## 2019-12-25 ENCOUNTER — Emergency Department
Admission: EM | Admit: 2019-12-25 | Discharge: 2019-12-25 | Disposition: A | Discharge status: Home / Self Care | Payer: 59 | Source: Home / Self Care

## 2019-12-25 ENCOUNTER — Other Ambulatory Visit: Payer: Self-pay

## 2019-12-25 ENCOUNTER — Emergency Department (INDEPENDENT_AMBULATORY_CARE_PROVIDER_SITE_OTHER): Payer: 59

## 2019-12-25 DIAGNOSIS — M25562 Pain in left knee: Secondary | ICD-10-CM

## 2019-12-25 MED ORDER — HYDROCODONE-ACETAMINOPHEN 5-325 MG PO TABS: 2.0000 | ORAL_TABLET | ORAL | 0 refills | Status: DC | PRN

## 2019-12-25 MED ORDER — DICLOFENAC SODIUM 75 MG PO TBEC: 75.0000 mg | DELAYED_RELEASE_TABLET | Freq: Two times a day (BID) | ORAL | 1 refills | Status: DC

## 2019-12-25 NOTE — ED Provider Notes (Signed)
[name]    CSN: [id number] Arrival date & time: 12/25/19  1310      History   Chief Complaint Chief Complaint  Patient presents with  . Knee Pain    HPI Richard Beltran is a 54 y.o. male.   Patient complains of left knee pain without injury.  Knee does make some noise when he goes up and down stairs.  Not especially worse in the morning versus night.  No history of giving way or locking.  HPI  Past Medical History:  Diagnosis Date  . Hypertension   . Lumbar degenerative disc disease 10/29/2013   Post L4-L5 fusion.   . Male hypogonadism     Patient Active Problem List   Diagnosis Date Noted  . Sciatica associated with disorder of lumbar spine 07/16/2019  . History of 2019 novel coronavirus disease (COVID-19) 12/18/2018  . Plantar fasciitis, left 04/10/2018  . Insomnia 07/29/2015  . Obesity 11/18/2014  . Obstructive sleep apnea 10/22/2014  . Male hypogonadism 11/06/2013  . Hypertension, essential, benign 10/29/2013  . Lumbar degenerative disc disease 10/29/2013  . Pulmonary nodule 10/29/2013  . Annual physical exam 10/29/2013  . 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 BACK SURGERY  2005       Home Medications    Prior to Admission medications   Medication Sig Start Date End Date Taking? Authorizing Provider  amLODipine (NORVASC) 10 MG tablet TAKE 1 TABLET BY MOUTH EVERY DAY 10/29/19   Silverio Decamp, MD  carvedilol (COREG) 25 MG tablet TAKE 1 TABLET BY MOUTH 2 TIMES DAILY WITH A MEAL 11/06/19   Silverio Decamp, MD  lisinopril-hydrochlorothiazide (ZESTORETIC) 20-25 MG tablet TAKE 1 TABLET BY MOUTH EVERY DAY 10/30/19   Silverio Decamp, MD  meloxicam (MOBIC) 15 MG tablet TAKE 1 TABLET BY MOUTH EVERY DAY AS NEEDED FOR PAIN 05/07/19   Silverio Decamp, MD  traMADol (ULTRAM) 50 MG tablet Take 1-2 tablets (50-100 mg total) by mouth 3 (three) times daily as needed  (pain). 11/22/19   Silverio Decamp, MD    Family History Family History  Problem Relation Age of Onset  . Diabetes Mother   . Hyperlipidemia Mother   . Healthy Father   . Healthy Maternal Grandmother   . Healthy Maternal Grandfather   . Healthy Paternal Grandmother   . Healthy Paternal Grandfather   . Colon cancer Neg Hx   . Colon polyps Neg Hx   . Esophageal cancer Neg Hx   . Rectal cancer Neg Hx   . Stomach cancer Neg Hx     Social History Social History   Tobacco Use  . Smoking status: Never Smoker  . Smokeless tobacco: Never Used  Vaping Use  . Vaping Use: Never used  Substance Use Topics  . Alcohol use: No  . Drug use: No     Allergies   Eggs or egg-derived products   Review of Systems Review of Systems  Musculoskeletal: Positive for arthralgias and joint swelling.  All other systems reviewed and are negative.    Physical Exam Triage Vital Signs ED Triage Vitals  Enc Vitals Group     BP 12/25/19 1325 (!) 145/87     Pulse Rate 12/25/19 1325 78     Resp 12/25/19 1325 16     Temp 12/25/19 1325 98.6 F (37 C)     Temp Source 12/25/19 1325 Oral     SpO2  12/25/19 1325 96 %     Weight --      Height --      Head Circumference --      Peak Flow --      Pain Score 12/25/19 1324 10     Pain Loc --      Pain Edu? --      Excl. in [place]? --    No data found.  Updated Vital Signs BP (!) 145/87 (BP Location: Right Arm)   Pulse 78   Temp 98.6 F (37 C) (Oral)   Resp 16   SpO2 96%   Visual Acuity Right Eye Distance:   Left Eye Distance:   Bilateral Distance:    Right Eye Near:   Left Eye Near:    Bilateral Near:     Physical Exam Constitutional:      Appearance: Normal appearance. He is obese.  Cardiovascular:     Rate and Rhythm: Normal rate and regular rhythm.  Pulmonary:     Effort: Pulmonary effort is normal.     Breath sounds: Normal breath sounds.  Musculoskeletal:     Comments: Left knee: Medial and lateral tenderness.  Some  crepitance with flexion extension.  Cannot detect effusion  Neurological:     Mental Status: He is alert.      UC Treatments / Results  Labs (all labs ordered are listed, but only abnormal results are displayed) Labs Reviewed - No data to display  EKG   Radiology x-ray within normal limits No results found.  Procedures Procedures (including critical care time)  Medications Ordered in UC Medications - No data to display  Initial Impression / Assessment and Plan / UC Course  I have reviewed the triage vital signs and the nursing notes.  Pertinent labs & imaging results that were available during my care of the patient were reviewed by me and considered in my medical decision making (see chart for details).     Knee pain, suspect early degenerative arthritis.  History or findings did not support acute injury Final Clinical Impressions(s) / UC Diagnoses   Final diagnoses:  None   Discharge Instructions   None    ED Prescriptions    None     PDMP not reviewed this encounter.   Richard Honour, MD 12/25/19 [phone]

## 2019-12-25 NOTE — Discharge Instructions (Signed)
May use cane to help with weightbearing and ambulation

## 2019-12-25 NOTE — ED Triage Notes (Signed)
Patient presents to Urgent Care with complaints of left knee pain since about 4 days ago. Patient reports he has been taking flexeril and rubbing alcohol on the knee. Pt denies injury, states he woke up and it was hurting.

## 2020-01-01 ENCOUNTER — Telehealth: Payer: Self-pay | Admitting: Sports Medicine

## 2020-01-01 NOTE — Telephone Encounter (Signed)
Appointment has been made. No further questions at this time.  

## 2020-01-01 NOTE — Telephone Encounter (Signed)
Patient went to our UC for Acute pain of left knee. States that it has got worse and wanted to know if PCP can work him in today or tomorrow.

## 2020-01-01 NOTE — Telephone Encounter (Signed)
He can see me at my next available slot

## 2020-01-03 ENCOUNTER — Ambulatory Visit (INDEPENDENT_AMBULATORY_CARE_PROVIDER_SITE_OTHER): Payer: 59 | Admitting: Sports Medicine

## 2020-01-03 ENCOUNTER — Other Ambulatory Visit: Payer: Self-pay | Admitting: Sports Medicine

## 2020-01-03 VITALS — BP 140/81 | HR 74 | Wt 282.0 lb

## 2020-01-03 DIAGNOSIS — M5136 Other intervertebral disc degeneration, lumbar region: Secondary | ICD-10-CM

## 2020-01-03 DIAGNOSIS — E291 Testicular hypofunction: Secondary | ICD-10-CM | POA: Diagnosis not present

## 2020-01-03 MED ORDER — TESTOSTERONE CYPIONATE 200 MG/ML IM SOLN
200.0000 mg | Freq: Once | INTRAMUSCULAR | Status: AC
  Administered 2020-01-03: 200 mg via INTRAMUSCULAR

## 2020-01-03 MED ORDER — CARVEDILOL 25 MG PO TABS: 25.0000 mg | ORAL_TABLET | Freq: Two times a day (BID) | ORAL | 1 refills | Status: DC

## 2020-01-03 MED ORDER — MELOXICAM 15 MG PO TABS: ORAL_TABLET | ORAL | 1 refills | Status: DC

## 2020-01-03 NOTE — Progress Notes (Signed)
Established Patient Office Visit  Subjective:  Patient ID: Richard Beltran, Richard Beltran    DOB: Jan 07, 1966  Age: 54 y.o. MRN: [id number]  CC:  Chief Complaint  Patient presents with   Hypogonadism    HPI Richard Beltran is here for a testosterone injection. Denies chest pain, shortness of breath, headaches or mood changes.    Shingles vaccine - Advised Richard Beltran to call his insurance company to find out if they cover the Shingles vaccine.   Flu vaccine - He is allergic to eggs.   Past Medical History:  Diagnosis Date   Hypertension    Lumbar degenerative disc disease 10/29/2013   Post L4-L5 fusion.    Richard Beltran hypogonadism     Past Surgical History:  Procedure Laterality Date   BACK SURGERY  2005    Family History  Problem Relation Age of Onset   Diabetes Mother    Hyperlipidemia Mother    Healthy Father    Healthy Maternal Grandmother    Healthy Maternal Grandfather    Healthy Paternal Grandmother    Healthy Paternal Grandfather    Colon cancer Neg Hx    Colon polyps Neg Hx    Esophageal cancer Neg Hx    Rectal cancer Neg Hx    Stomach cancer Neg Hx     Social History   Socioeconomic History   Marital status: Married    Spouse name: Not on file   Number of children: 4   Years of education: 18+   Highest education level: Not on file  Occupational History   Occupation: Database administrator  Tobacco Use   Smoking status: Never Smoker   Smokeless tobacco: Never Used  Scientific laboratory technician Use: Never used  Substance and Sexual Activity   Alcohol use: No   Drug use: No   Sexual activity: Not on file  Other Topics Concern   Not on file  Social History Narrative   Fun: Golf and read   Denies religious beliefs effecting healthcare.    Social Determinants of Health   Financial Resource Strain:    Difficulty of Paying Living Expenses: Not on file  Food Insecurity:    Worried About Charity fundraiser in the Last Year: Not on file   [employer]  of Food in the Last Year: Not on file  Transportation Needs:    Lack of Transportation (Medical): Not on file   Lack of Transportation (Non-Medical): Not on file  Physical Activity:    Days of Exercise per Week: Not on file   Minutes of Exercise per Session: Not on file  Stress:    Feeling of Stress : Not on file  Social Connections:    Frequency of Communication with Friends and Family: Not on file   Frequency of Social Gatherings with Friends and Family: Not on file   Attends Religious Services: Not on file   Active Member of Clubs or Organizations: Not on file   Attends Archivist Meetings: Not on file   Marital Status: Not on file  Intimate Partner Violence:    Fear of Current or Ex-Partner: Not on file   Emotionally Abused: Not on file   Physically Abused: Not on file   Sexually Abused: Not on file    Outpatient Medications Prior to Visit  Medication Sig Dispense Refill   amLODipine (NORVASC) 10 MG tablet TAKE 1 TABLET BY MOUTH EVERY DAY 90 tablet 1   diclofenac (VOLTAREN) 75 MG EC tablet Take 1 tablet (  75 mg total) by mouth 2 (two) times daily. 20 tablet 1   HYDROcodone-acetaminophen (NORCO/VICODIN) 5-325 MG tablet Take 2 tablets by mouth every 4 (four) hours as needed. 10 tablet 0   lisinopril-hydrochlorothiazide (ZESTORETIC) 20-25 MG tablet TAKE 1 TABLET BY MOUTH EVERY DAY 90 tablet 1   traMADol (ULTRAM) 50 MG tablet Take 1-2 tablets (50-100 mg total) by mouth 3 (three) times daily as needed (pain). 180 tablet 0   carvedilol (COREG) 25 MG tablet TAKE 1 TABLET BY MOUTH 2 TIMES DAILY WITH A MEAL 180 tablet 0   meloxicam (MOBIC) 15 MG tablet TAKE 1 TABLET BY MOUTH EVERY DAY AS NEEDED FOR PAIN 90 tablet 1   No facility-administered medications prior to visit.    Allergies  Allergen Reactions   Eggs Or Egg-Derived Products Nausea And Vomiting    ROS Review of Systems    Objective:    Physical Exam  BP 140/81    Pulse 74    Wt 282 lb  (127.9 kg)    SpO2 99%    BMI 37.21 kg/m  Wt Readings from Last 3 Encounters:  01/03/20 282 lb (127.9 kg)  12/20/19 282 lb (127.9 kg)  12/03/19 279 lb (126.6 kg)     Health Maintenance Due  Topic Date Due   Hepatitis C Screening  Never done    There are no preventive care reminders to display for this patient.  Lab Results  Component Value Date   TSH 1.31 08/30/2019   Lab Results  Component Value Date   WBC 6.2 08/30/2019   HGB 14.6 08/30/2019   HCT 45.2 08/30/2019   MCV 89.9 08/30/2019   PLT 249 08/30/2019   Lab Results  Component Value Date   NA 138 08/30/2019   K 4.1 08/30/2019   CO2 28 08/30/2019   GLUCOSE 99 08/30/2019   BUN 11 08/30/2019   CREATININE 1.16 08/30/2019   BILITOT 0.5 08/30/2019   ALKPHOS 76 12/08/2016   AST 17 08/30/2019   ALT 16 08/30/2019   PROT 6.9 08/30/2019   ALBUMIN 4.2 12/08/2016   CALCIUM 9.4 08/30/2019   Lab Results  Component Value Date   CHOL 218 (H) 08/30/2019   Lab Results  Component Value Date   HDL 37 (L) 08/30/2019   Lab Results  Component Value Date   LDLCALC 151 (H) 08/30/2019   Lab Results  Component Value Date   TRIG 167 (H) 08/30/2019   Lab Results  Component Value Date   CHOLHDL 5.9 (H) 08/30/2019   Lab Results  Component Value Date   HGBA1C 4.6 08/30/2019      Assessment & Plan:  Hypogonadism - Patient tolerated injection well without complications. Patient advised to schedule next injection 14 days from today.   HTN - refilled carvedilol today  Lumbar Degenerative Disc Disease - needs refill on Tramadol.    Problem List Items Addressed This Visit    Lumbar degenerative disc disease   Relevant Medications   meloxicam (MOBIC) 15 MG tablet   Richard Beltran hypogonadism - Primary      Meds ordered this encounter  Medications   carvedilol (COREG) 25 MG tablet    Sig: Take 1 tablet (25 mg total) by mouth 2 (two) times daily with a meal.    Dispense:  180 tablet    Refill:  1   meloxicam (MOBIC)  15 MG tablet    Sig: TAKE 1 TABLET BY MOUTH EVERY DAY AS NEEDED FOR PAIN    Dispense:  90  tablet    Refill:  1   testosterone cypionate (DEPOTESTOSTERONE CYPIONATE) injection 200 mg    Follow-up: Return in about 2 weeks (around 01/17/2020) for testosterone injection. Durene Romans, Monico Blitz, [place]

## 2020-01-18 ENCOUNTER — Ambulatory Visit: Payer: 59

## 2020-01-21 ENCOUNTER — Ambulatory Visit: Payer: 59 | Admitting: Sports Medicine

## 2020-01-22 ENCOUNTER — Other Ambulatory Visit: Payer: Self-pay

## 2020-01-22 ENCOUNTER — Ambulatory Visit (INDEPENDENT_AMBULATORY_CARE_PROVIDER_SITE_OTHER): Payer: 59 | Admitting: Sports Medicine

## 2020-01-22 VITALS — BP 121/74 | HR 69 | Wt 283.0 lb

## 2020-01-22 DIAGNOSIS — E291 Testicular hypofunction: Secondary | ICD-10-CM | POA: Diagnosis not present

## 2020-01-22 MED ORDER — TESTOSTERONE CYPIONATE 200 MG/ML IM SOLN
200.0000 mg | Freq: Once | INTRAMUSCULAR | Status: AC
  Administered 2020-01-22: 200 mg via INTRAMUSCULAR

## 2020-01-22 NOTE — Progress Notes (Signed)
Established Patient Office Visit  Subjective:  Patient ID: Richard Beltran, male    DOB: 04-09-1966  Age: 54 y.o. MRN: [id number]  CC:  Chief Complaint  Patient presents with  . Hypogonadism    HPI Richard Beltran is here for a testosterone injection. Denies chest pain, shortness of breath, headaches or mood changes.   Past Medical History:  Diagnosis Date  . Hypertension   . Lumbar degenerative disc disease 10/29/2013   Post L4-L5 fusion.   . Male hypogonadism     Past Surgical History:  Procedure Laterality Date  . BACK SURGERY  2005    Family History  Problem Relation Age of Onset  . Diabetes Mother   . Hyperlipidemia Mother   . Healthy Father   . Healthy Maternal Grandmother   . Healthy Maternal Grandfather   . Healthy Paternal Grandmother   . Healthy Paternal Grandfather   . Colon cancer Neg Hx   . Colon polyps Neg Hx   . Esophageal cancer Neg Hx   . Rectal cancer Neg Hx   . Stomach cancer Neg Hx     Social History   Socioeconomic History  . Marital status: Married    Spouse name: Not on file  . Number of children: 4  . Years of education: 18+  . Highest education level: Not on file  Occupational History  . Occupation: Database administrator  Tobacco Use  . Smoking status: Never Smoker  . Smokeless tobacco: Never Used  Vaping Use  . Vaping Use: Never used  Substance and Sexual Activity  . Alcohol use: No  . Drug use: No  . Sexual activity: Not on file  Other Topics Concern  . Not on file  Social History Narrative   Fun: Golf and read   Denies religious beliefs effecting healthcare.    Social Determinants of Health   Financial Resource Strain:   . Difficulty of Paying Living Expenses: Not on file  Food Insecurity:   . Worried About Charity fundraiser in the Last Year: Not on file  . Ran Out of Food in the Last Year: Not on file  Transportation Needs:   . Lack of Transportation (Medical): Not on file  . Lack of Transportation (Non-Medical): Not on  file  Physical Activity:   . Days of Exercise per Week: Not on file  . Minutes of Exercise per Session: Not on file  Stress:   . Feeling of Stress : Not on file  Social Connections:   . Frequency of Communication with Friends and Family: Not on file  . Frequency of Social Gatherings with Friends and Family: Not on file  . Attends Religious Services: Not on file  . Active Member of Clubs or Organizations: Not on file  . Attends Archivist Meetings: Not on file  . Marital Status: Not on file  Intimate Partner Violence:   . Fear of Current or Ex-Partner: Not on file  . Emotionally Abused: Not on file  . Physically Abused: Not on file  . Sexually Abused: Not on file    Outpatient Medications Prior to Visit  Medication Sig Dispense Refill  . amLODipine (NORVASC) 10 MG tablet TAKE 1 TABLET BY MOUTH EVERY DAY 90 tablet 1  . carvedilol (COREG) 25 MG tablet Take 1 tablet (25 mg total) by mouth 2 (two) times daily with a meal. 180 tablet 1  . diclofenac (VOLTAREN) 75 MG EC tablet Take 1 tablet (75 mg total) by mouth 2 (two)  times daily. 20 tablet 1  . HYDROcodone-acetaminophen (NORCO/VICODIN) 5-325 MG tablet Take 2 tablets by mouth every 4 (four) hours as needed. 10 tablet 0  . lisinopril-hydrochlorothiazide (ZESTORETIC) 20-25 MG tablet TAKE 1 TABLET BY MOUTH EVERY DAY 90 tablet 1  . meloxicam (MOBIC) 15 MG tablet TAKE 1 TABLET BY MOUTH EVERY DAY AS NEEDED FOR PAIN 90 tablet 1  . traMADol (ULTRAM) 50 MG tablet TAKE 1-2 TABLETS (50-100 MG TOTAL) BY MOUTH 3 (THREE) TIMES DAILY AS NEEDED (PAIN). 180 tablet 0   No facility-administered medications prior to visit.    Allergies  Allergen Reactions  . Eggs Or Egg-Derived Products Nausea And Vomiting    ROS Review of Systems    Objective:    Physical Exam  BP 121/74   Pulse 69   Wt 283 lb (128.4 kg)   SpO2 99%   BMI 37.34 kg/m  Wt Readings from Last 3 Encounters:  01/22/20 283 lb (128.4 kg)  01/03/20 282 lb (127.9 kg)   12/20/19 282 lb (127.9 kg)     Health Maintenance Due  Topic Date Due  . Hepatitis C Screening  Never done    There are no preventive care reminders to display for this patient.  Lab Results  Component Value Date   TSH 1.31 08/30/2019   Lab Results  Component Value Date   WBC 6.2 08/30/2019   HGB 14.6 08/30/2019   HCT 45.2 08/30/2019   MCV 89.9 08/30/2019   PLT 249 08/30/2019   Lab Results  Component Value Date   NA 138 08/30/2019   K 4.1 08/30/2019   CO2 28 08/30/2019   GLUCOSE 99 08/30/2019   BUN 11 08/30/2019   CREATININE 1.16 08/30/2019   BILITOT 0.5 08/30/2019   ALKPHOS 76 12/08/2016   AST 17 08/30/2019   ALT 16 08/30/2019   PROT 6.9 08/30/2019   ALBUMIN 4.2 12/08/2016   CALCIUM 9.4 08/30/2019   Lab Results  Component Value Date   CHOL 218 (H) 08/30/2019   Lab Results  Component Value Date   HDL 37 (L) 08/30/2019   Lab Results  Component Value Date   LDLCALC 151 (H) 08/30/2019   Lab Results  Component Value Date   TRIG 167 (H) 08/30/2019   Lab Results  Component Value Date   CHOLHDL 5.9 (H) 08/30/2019   Lab Results  Component Value Date   HGBA1C 4.6 08/30/2019      Assessment & Plan:  Patient tolerated injection well without complications. Patient advised to schedule next injection 14 days from today.    Problem List Items Addressed This Visit    Male hypogonadism - Primary      Meds ordered this encounter  Medications  . testosterone cypionate (DEPOTESTOSTERONE CYPIONATE) injection 200 mg    Follow-up: Return in about 2 weeks (around 02/05/2020) for testosterone injection. Durene Romans, Monico Blitz, [place]

## 2020-01-24 ENCOUNTER — Ambulatory Visit: Payer: 59 | Admitting: Sports Medicine

## 2020-02-05 ENCOUNTER — Ambulatory Visit: Payer: 59

## 2020-02-08 ENCOUNTER — Other Ambulatory Visit: Payer: Self-pay | Admitting: Sports Medicine

## 2020-02-08 DIAGNOSIS — M5136 Other intervertebral disc degeneration, lumbar region: Secondary | ICD-10-CM

## 2020-02-15 ENCOUNTER — Ambulatory Visit (INDEPENDENT_AMBULATORY_CARE_PROVIDER_SITE_OTHER): Payer: 59 | Admitting: Sports Medicine

## 2020-02-15 VITALS — BP 138/75 | HR 73 | Wt 278.0 lb

## 2020-02-15 DIAGNOSIS — E291 Testicular hypofunction: Secondary | ICD-10-CM | POA: Diagnosis not present

## 2020-02-15 MED ORDER — TESTOSTERONE CYPIONATE 200 MG/ML IM SOLN
200.0000 mg | Freq: Once | INTRAMUSCULAR | Status: AC
  Administered 2020-02-15: 200 mg via INTRAMUSCULAR

## 2020-02-15 NOTE — Progress Notes (Signed)
   Subjective:    Patient ID: Richard Beltran, male    DOB: 1965/10/12, 54 y.o.   MRN: [id number]  HPI Patient here for a testosterone injection.  Denies chest pain, shortness of breath, headaches and problems with medication or mood changes.    Review of Systems     Objective:   Physical Exam        Assessment & Plan:  Patient tolerated injection well without complications.  Patient advised to schedule next injection in 2 weeks.

## 2020-02-29 ENCOUNTER — Ambulatory Visit: Payer: 59

## 2020-03-07 ENCOUNTER — Ambulatory Visit (INDEPENDENT_AMBULATORY_CARE_PROVIDER_SITE_OTHER): Payer: 59 | Admitting: Sports Medicine

## 2020-03-07 ENCOUNTER — Other Ambulatory Visit: Payer: Self-pay

## 2020-03-07 VITALS — BP 148/82 | HR 62

## 2020-03-07 DIAGNOSIS — E291 Testicular hypofunction: Secondary | ICD-10-CM

## 2020-03-07 MED ORDER — TESTOSTERONE CYPIONATE 200 MG/ML IM SOLN
200.0000 mg | Freq: Once | INTRAMUSCULAR | Status: AC
  Administered 2020-03-07: 200 mg via INTRAMUSCULAR

## 2020-03-07 NOTE — Progress Notes (Signed)
   Subjective:    Patient ID: Richard Beltran, male    DOB: May 06, 1965, 54 y.o.   MRN: [id number]  HPI Patient is here for a testosterone injection. Denies chest pain, shortness of breath, headaches, problems with mood change, or medication problems.   Review of Systems     Objective:   Physical Exam        Assessment & Plan:  Patient tolerated injection in LUOQ well without complications. Patient advised to schedule his next injection for 2 weeks from today.  BP elevated on first check at 148/83. Had patient rest in room for 10 minutes then BP was still 148/82. Patient confirms he has taken his amlodipine today, but also states he had a cup of coffee prior to coming into appt. Advised him I would let Dr T know.

## 2020-03-21 ENCOUNTER — Ambulatory Visit (INDEPENDENT_AMBULATORY_CARE_PROVIDER_SITE_OTHER): Payer: 59 | Admitting: Sports Medicine

## 2020-03-21 ENCOUNTER — Other Ambulatory Visit: Payer: Self-pay

## 2020-03-21 VITALS — BP 140/71 | HR 71

## 2020-03-21 DIAGNOSIS — E291 Testicular hypofunction: Secondary | ICD-10-CM

## 2020-03-21 DIAGNOSIS — M5136 Other intervertebral disc degeneration, lumbar region: Secondary | ICD-10-CM

## 2020-03-21 MED ORDER — TRAMADOL HCL 50 MG PO TABS
50.0000 mg | ORAL_TABLET | Freq: Three times a day (TID) | ORAL | 0 refills | Status: DC | PRN
Start: 2020-03-21 — End: 2020-05-06

## 2020-03-21 MED ORDER — TESTOSTERONE CYPIONATE 200 MG/ML IM SOLN
200.0000 mg | Freq: Once | INTRAMUSCULAR | Status: AC
  Administered 2020-03-21: 200 mg via INTRAMUSCULAR

## 2020-03-21 NOTE — Progress Notes (Signed)
   Subjective:    Patient ID: Richard Beltran, male    DOB: [birth year], 54 y.o.   MRN: [id number]  HPI Patient is here for a testosterone injection. Denies chest pain, shortness of breath, headaches, problems with mood change, or medication problems.   Review of Systems     Objective:   Physical Exam        Assessment & Plan:  Patient tolerated injection in RUOQ well without complications. Patient advised to schedule his next injection for 2 weeks from today.  Also requesting RF on Tramadol. Last RX sent 02/08/20 for #180, 1-2 tabs PO TID PRN pain. RX pended

## 2020-04-04 ENCOUNTER — Ambulatory Visit (INDEPENDENT_AMBULATORY_CARE_PROVIDER_SITE_OTHER): Payer: 59 | Admitting: Sports Medicine

## 2020-04-04 ENCOUNTER — Other Ambulatory Visit: Payer: Self-pay | Admitting: Sports Medicine

## 2020-04-04 ENCOUNTER — Other Ambulatory Visit: Payer: Self-pay

## 2020-04-04 VITALS — BP 150/85 | HR 80

## 2020-04-04 DIAGNOSIS — E291 Testicular hypofunction: Secondary | ICD-10-CM

## 2020-04-04 MED ORDER — TESTOSTERONE CYPIONATE 200 MG/ML IM SOLN
200.0000 mg | Freq: Once | INTRAMUSCULAR | Status: AC
  Administered 2020-04-04: 200 mg via INTRAMUSCULAR

## 2020-04-04 NOTE — Progress Notes (Signed)
Established Patient Office Visit  Subjective:  Patient ID: Richard Beltran, male    DOB: [birth year]  Age: 54 y.o. MRN: [id number]  CC:  Chief Complaint  Patient presents with   Hypogonadism    HPI Richard Beltran is here for a testosterone injection. Denies chest pain, shortness of breath, headaches or mood changes.   Past Medical History:  Diagnosis Date   Hypertension    Lumbar degenerative disc disease 10/29/2013   Post L4-L5 fusion.    Male hypogonadism     Past Surgical History:  Procedure Laterality Date   BACK SURGERY  2005    Family History  Problem Relation Age of Onset   Diabetes Mother    Hyperlipidemia Mother    Healthy Father    Healthy Maternal Grandmother    Healthy Maternal Grandfather    Healthy Paternal Grandmother    Healthy Paternal Grandfather    Colon cancer Neg Hx    Colon polyps Neg Hx    Esophageal cancer Neg Hx    Rectal cancer Neg Hx    Stomach cancer Neg Hx     Social History   Socioeconomic History   Marital status: Married    Spouse name: Not on file   Number of children: 4   Years of education: 18+   Highest education level: Not on file  Occupational History   Occupation: Database administrator  Tobacco Use   Smoking status: Never Smoker   Smokeless tobacco: Never Used  Scientific laboratory technician Use: Never used  Substance and Sexual Activity   Alcohol use: No   Drug use: No   Sexual activity: Not on file  Other Topics Concern   Not on file  Social History Narrative   Fun: Golf and read   Denies religious beliefs effecting healthcare.    Social Determinants of Health   Financial Resource Strain:    Difficulty of Paying Living Expenses: Not on file  Food Insecurity:    Worried About Charity fundraiser in the Last Year: Not on file   [employer] of Food in the Last Year: Not on file  Transportation Needs:    Lack of Transportation (Medical): Not on file   Lack of Transportation (Non-Medical): Not on  file  Physical Activity:    Days of Exercise per Week: Not on file   Minutes of Exercise per Session: Not on file  Stress:    Feeling of Stress : Not on file  Social Connections:    Frequency of Communication with Friends and Family: Not on file   Frequency of Social Gatherings with Friends and Family: Not on file   Attends Religious Services: Not on file   Active Member of Clubs or Organizations: Not on file   Attends Archivist Meetings: Not on file   Marital Status: Not on file  Intimate Partner Violence:    Fear of Current or Ex-Partner: Not on file   Emotionally Abused: Not on file   Physically Abused: Not on file   Sexually Abused: Not on file    Outpatient Medications Prior to Visit  Medication Sig Dispense Refill   amLODipine (NORVASC) 10 MG tablet TAKE 1 TABLET BY MOUTH EVERY DAY 90 tablet 1   carvedilol (COREG) 25 MG tablet Take 1 tablet (25 mg total) by mouth 2 (two) times daily with a meal. 180 tablet 1   diclofenac (VOLTAREN) 75 MG EC tablet Take 1 tablet (75 mg total) by mouth 2 (two)  times daily. 20 tablet 1   HYDROcodone-acetaminophen (NORCO/VICODIN) 5-325 MG tablet Take 2 tablets by mouth every 4 (four) hours as needed. 10 tablet 0   lisinopril-hydrochlorothiazide (ZESTORETIC) 20-25 MG tablet TAKE 1 TABLET BY MOUTH EVERY DAY 90 tablet 1   meloxicam (MOBIC) 15 MG tablet TAKE 1 TABLET BY MOUTH EVERY DAY AS NEEDED FOR PAIN 90 tablet 1   traMADol (ULTRAM) 50 MG tablet Take 1-2 tablets (50-100 mg total) by mouth 3 (three) times daily as needed (pain). 180 tablet 0   No facility-administered medications prior to visit.    Allergies  Allergen Reactions   Eggs Or Egg-Derived Products Nausea And Vomiting    ROS Review of Systems    Objective:    Physical Exam  BP (!) 150/85    Pulse 80    SpO2 98%  Wt Readings from Last 3 Encounters:  02/15/20 278 lb (126.1 kg)  01/22/20 283 lb (128.4 kg)  01/03/20 282 lb (127.9 kg)      Health Maintenance Due  Topic Date Due   Hepatitis C Screening  Never done    There are no preventive care reminders to display for this patient.  Lab Results  Component Value Date   TSH 1.31 08/30/2019   Lab Results  Component Value Date   WBC 6.2 08/30/2019   HGB 14.6 08/30/2019   HCT 45.2 08/30/2019   MCV 89.9 08/30/2019   PLT 249 08/30/2019   Lab Results  Component Value Date   NA 138 08/30/2019   K 4.1 08/30/2019   CO2 28 08/30/2019   GLUCOSE 99 08/30/2019   BUN 11 08/30/2019   CREATININE 1.16 08/30/2019   BILITOT 0.5 08/30/2019   ALKPHOS 76 12/08/2016   AST 17 08/30/2019   ALT 16 08/30/2019   PROT 6.9 08/30/2019   ALBUMIN 4.2 12/08/2016   CALCIUM 9.4 08/30/2019   Lab Results  Component Value Date   CHOL 218 (H) 08/30/2019   Lab Results  Component Value Date   HDL 37 (L) 08/30/2019   Lab Results  Component Value Date   LDLCALC 151 (H) 08/30/2019   Lab Results  Component Value Date   TRIG 167 (H) 08/30/2019   Lab Results  Component Value Date   CHOLHDL 5.9 (H) 08/30/2019   Lab Results  Component Value Date   HGBA1C 4.6 08/30/2019      Assessment & Plan:  Patient tolerated injection well without complications. Patient advised to schedule next injection 14 days from today.   Also advised to make sure to take blood pressure medication daily around the same time.    Problem List Items Addressed This Visit    Male hypogonadism - Primary      Meds ordered this encounter  Medications   testosterone cypionate (DEPOTESTOSTERONE CYPIONATE) injection 200 mg    Follow-up: Return in about 2 weeks (around 04/18/2020) for Testosterone injection. Durene Romans, Monico Blitz, [place]

## 2020-04-18 ENCOUNTER — Ambulatory Visit: Payer: 59

## 2020-05-06 ENCOUNTER — Ambulatory Visit (INDEPENDENT_AMBULATORY_CARE_PROVIDER_SITE_OTHER): Payer: Self-pay | Admitting: Sports Medicine

## 2020-05-06 ENCOUNTER — Other Ambulatory Visit: Payer: Self-pay

## 2020-05-06 VITALS — BP 142/82 | HR 64 | Wt 286.0 lb

## 2020-05-06 DIAGNOSIS — E291 Testicular hypofunction: Secondary | ICD-10-CM

## 2020-05-06 DIAGNOSIS — M5136 Other intervertebral disc degeneration, lumbar region: Secondary | ICD-10-CM

## 2020-05-06 MED ORDER — TRAMADOL HCL 50 MG PO TABS: 50.0000 mg | ORAL_TABLET | Freq: Three times a day (TID) | ORAL | 0 refills | Status: DC | PRN

## 2020-05-06 MED ORDER — TESTOSTERONE CYPIONATE 200 MG/ML IM SOLN
200.0000 mg | INTRAMUSCULAR | Status: DC
  Administered 2020-05-06 – 2020-07-30 (×2): 200 mg via INTRAMUSCULAR

## 2020-05-06 NOTE — Progress Notes (Signed)
Patient is here for a testosterone injection of 200mg.   Given in RUOQ.  Denies chest pain, shortness of breath, headaches and problems with medication or mood changes.   Tolerated injection well without complications.   Patient advised to schedule next injection in 14 days.  

## 2020-05-19 ENCOUNTER — Ambulatory Visit: Payer: Self-pay

## 2020-05-21 ENCOUNTER — Other Ambulatory Visit: Payer: Self-pay

## 2020-05-21 ENCOUNTER — Ambulatory Visit (INDEPENDENT_AMBULATORY_CARE_PROVIDER_SITE_OTHER): Payer: Self-pay | Admitting: Sports Medicine

## 2020-05-21 VITALS — BP 134/76 | HR 68 | Wt 286.0 lb

## 2020-05-21 DIAGNOSIS — E291 Testicular hypofunction: Secondary | ICD-10-CM

## 2020-05-21 MED ORDER — TESTOSTERONE CYPIONATE 200 MG/ML IM SOLN
200.0000 mg | Freq: Once | INTRAMUSCULAR | Status: AC
  Administered 2020-05-21: 200 mg via INTRAMUSCULAR

## 2020-05-21 NOTE — Progress Notes (Signed)
   Subjective:    Patient ID: Richard Beltran, male    DOB: 1965/12/25, 55 y.o.   MRN: [id number]  HPI  Patient is here for a testosterone injection. Denies chest pain, shortness of breath, headaches and problems with medication or mood changes.     Review of Systems     Objective:   Physical Exam        Assessment & Plan:  Patient tolerated injection well without complications. Patient advised to schedule next injection in 14 days.

## 2020-06-04 ENCOUNTER — Ambulatory Visit (INDEPENDENT_AMBULATORY_CARE_PROVIDER_SITE_OTHER): Payer: Self-pay | Admitting: Sports Medicine

## 2020-06-04 ENCOUNTER — Telehealth: Payer: Self-pay | Admitting: Sports Medicine

## 2020-06-04 ENCOUNTER — Other Ambulatory Visit: Payer: Self-pay

## 2020-06-04 ENCOUNTER — Other Ambulatory Visit: Payer: Self-pay | Admitting: Sports Medicine

## 2020-06-04 VITALS — BP 139/84 | HR 70

## 2020-06-04 DIAGNOSIS — E291 Testicular hypofunction: Secondary | ICD-10-CM

## 2020-06-04 MED ORDER — TESTOSTERONE CYPIONATE 200 MG/ML IM SOLN
200.0000 mg | Freq: Once | INTRAMUSCULAR | Status: AC
  Administered 2020-06-04: 200 mg via INTRAMUSCULAR

## 2020-06-04 NOTE — Telephone Encounter (Signed)
Pt called. He needs a refill on his Meloxicam and Lisinopril.  Thank you.

## 2020-06-04 NOTE — Progress Notes (Signed)
Established Patient Office Visit  Subjective:  Patient ID: Richard Beltran, male    DOB: [birth year]  Age: 55 y.o. MRN: [id number]  CC:  Chief Complaint  Patient presents with  . Hypogonadism    HPI Richard Beltran is here for a testosterone injection. Denies chest pain, shortness of breath, headaches or mood changes.   HTN - Borderline blood pressure. He states he was out of the Lisinopril today. He is going by the pharmacy today.   Past Medical History:  Diagnosis Date  . Hypertension   . Lumbar degenerative disc disease 10/29/2013   Post L4-L5 fusion.   . Male hypogonadism     Past Surgical History:  Procedure Laterality Date  . BACK SURGERY  2005    Family History  Problem Relation Age of Onset  . Diabetes Mother   . Hyperlipidemia Mother   . Healthy Father   . Healthy Maternal Grandmother   . Healthy Maternal Grandfather   . Healthy Paternal Grandmother   . Healthy Paternal Grandfather   . Colon cancer Neg Hx   . Colon polyps Neg Hx   . Esophageal cancer Neg Hx   . Rectal cancer Neg Hx   . Stomach cancer Neg Hx     Social History   Socioeconomic History  . Marital status: Married    Spouse name: Not on file  . Number of children: 4  . Years of education: 18+  . Highest education level: Not on file  Occupational History  . Occupation: Database administrator  Tobacco Use  . Smoking status: Never Smoker  . Smokeless tobacco: Never Used  Vaping Use  . Vaping Use: Never used  Substance and Sexual Activity  . Alcohol use: No  . Drug use: No  . Sexual activity: Not on file  Other Topics Concern  . Not on file  Social History Narrative   Fun: Golf and read   Denies religious beliefs effecting healthcare.    Social Determinants of Health   Financial Resource Strain: Not on file  Food Insecurity: Not on file  Transportation Needs: Not on file  Physical Activity: Not on file  Stress: Not on file  Social Connections: Not on file  Intimate Partner Violence:  Not on file    Outpatient Medications Prior to Visit  Medication Sig Dispense Refill  . amLODipine (NORVASC) 10 MG tablet TAKE 1 TABLET BY MOUTH EVERY DAY 90 tablet 1  . carvedilol (COREG) 25 MG tablet Take 1 tablet (25 mg total) by mouth 2 (two) times daily with a meal. 180 tablet 1  . diclofenac (VOLTAREN) 75 MG EC tablet Take 1 tablet (75 mg total) by mouth 2 (two) times daily. 20 tablet 1  . HYDROcodone-acetaminophen (NORCO/VICODIN) 5-325 MG tablet Take 2 tablets by mouth every 4 (four) hours as needed. 10 tablet 0  . lisinopril-hydrochlorothiazide (ZESTORETIC) 20-25 MG tablet TAKE 1 TABLET BY MOUTH EVERY DAY 90 tablet 1  . meloxicam (MOBIC) 15 MG tablet TAKE 1 TABLET BY MOUTH EVERY DAY AS NEEDED FOR PAIN 90 tablet 1  . traMADol (ULTRAM) 50 MG tablet Take 1-2 tablets (50-100 mg total) by mouth 3 (three) times daily as needed (pain). 180 tablet 0   Facility-Administered Medications Prior to Visit  Medication Dose Route Frequency Provider Last Rate Last Admin  . testosterone cypionate (DEPOTESTOSTERONE CYPIONATE) injection 200 mg  200 mg Intramuscular Q14 Days Silverio Decamp, MD   200 mg at 05/06/20 0845    Allergies  Allergen Reactions  .  Eggs Or Egg-Derived Products Nausea And Vomiting    ROS Review of Systems    Objective:    Physical Exam  BP 139/84   Pulse 70   SpO2 98%  Wt Readings from Last 3 Encounters:  05/21/20 286 lb (129.7 kg)  05/06/20 286 lb (129.7 kg)  02/15/20 278 lb (126.1 kg)     Health Maintenance Due  Topic Date Due  . Hepatitis C Screening  Never done  . COVID-19 Vaccine (3 - Booster for Pfizer series) 02/13/2020    There are no preventive care reminders to display for this patient.  Lab Results  Component Value Date   TSH 1.31 08/30/2019   Lab Results  Component Value Date   WBC 6.2 08/30/2019   HGB 14.6 08/30/2019   HCT 45.2 08/30/2019   MCV 89.9 08/30/2019   PLT 249 08/30/2019   Lab Results  Component Value Date   NA 138  08/30/2019   K 4.1 08/30/2019   CO2 28 08/30/2019   GLUCOSE 99 08/30/2019   BUN 11 08/30/2019   CREATININE 1.16 08/30/2019   BILITOT 0.5 08/30/2019   ALKPHOS 76 12/08/2016   AST 17 08/30/2019   ALT 16 08/30/2019   PROT 6.9 08/30/2019   ALBUMIN 4.2 12/08/2016   CALCIUM 9.4 08/30/2019   Lab Results  Component Value Date   CHOL 218 (H) 08/30/2019   Lab Results  Component Value Date   HDL 37 (L) 08/30/2019   Lab Results  Component Value Date   LDLCALC 151 (H) 08/30/2019   Lab Results  Component Value Date   TRIG 167 (H) 08/30/2019   Lab Results  Component Value Date   CHOLHDL 5.9 (H) 08/30/2019   Lab Results  Component Value Date   HGBA1C 4.6 08/30/2019      Assessment & Plan:  Hypogonadism - Patient tolerated injection well without complications. Patient advised to schedule next injection 14 days from today.   HTN - Advised patient to pick up his Lisinopril today.   Problem List Items Addressed This Visit    Male hypogonadism - Primary      Meds ordered this encounter  Medications  . testosterone cypionate (DEPOTESTOSTERONE CYPIONATE) injection 200 mg    Follow-up: Return in about 2 weeks (around 06/18/2020) for testosterone injection. Durene Romans, Monico Blitz, [place]

## 2020-06-04 NOTE — Telephone Encounter (Signed)
Last time patient actually saw Dr. Darene Lamer was 12/2018. He needs an OV.

## 2020-06-04 NOTE — Telephone Encounter (Signed)
Okay, an appointment has been scheduled.  Thank you

## 2020-06-06 ENCOUNTER — Other Ambulatory Visit: Payer: Self-pay

## 2020-06-06 ENCOUNTER — Encounter: Payer: Self-pay | Admitting: Sports Medicine

## 2020-06-06 ENCOUNTER — Ambulatory Visit (INDEPENDENT_AMBULATORY_CARE_PROVIDER_SITE_OTHER): Payer: Self-pay | Admitting: Sports Medicine

## 2020-06-06 DIAGNOSIS — Z0000 Encounter for general adult medical examination without abnormal findings: Secondary | ICD-10-CM

## 2020-06-06 DIAGNOSIS — E6609 Other obesity due to excess calories: Secondary | ICD-10-CM

## 2020-06-06 DIAGNOSIS — G4733 Obstructive sleep apnea (adult) (pediatric): Secondary | ICD-10-CM

## 2020-06-06 DIAGNOSIS — E291 Testicular hypofunction: Secondary | ICD-10-CM

## 2020-06-06 DIAGNOSIS — M5136 Other intervertebral disc degeneration, lumbar region: Secondary | ICD-10-CM

## 2020-06-06 DIAGNOSIS — M51369 Other intervertebral disc degeneration, lumbar region without mention of lumbar back pain or lower extremity pain: Secondary | ICD-10-CM

## 2020-06-06 DIAGNOSIS — I10 Essential (primary) hypertension: Secondary | ICD-10-CM

## 2020-06-06 MED ORDER — AMLODIPINE BESYLATE 10 MG PO TABS
10.0000 mg | ORAL_TABLET | Freq: Every day | ORAL | 3 refills | Status: DC
Start: 2020-06-06 — End: 2020-08-27

## 2020-06-06 MED ORDER — TRAMADOL HCL 50 MG PO TABS: 50.0000 mg | ORAL_TABLET | Freq: Three times a day (TID) | ORAL | 0 refills | Status: DC | PRN

## 2020-06-06 MED ORDER — CARVEDILOL 25 MG PO TABS: 25.0000 mg | ORAL_TABLET | Freq: Two times a day (BID) | ORAL | 1 refills | Status: DC

## 2020-06-06 MED ORDER — LISINOPRIL-HYDROCHLOROTHIAZIDE 20-25 MG PO TABS
1.0000 | ORAL_TABLET | Freq: Every day | ORAL | 3 refills | Status: DC
Start: 2020-06-06 — End: 2020-09-11

## 2020-06-06 NOTE — Assessment & Plan Note (Signed)
Rechecking testosterone and PSA. Labs need to be obtained approximately 1 week after his injection.

## 2020-06-06 NOTE — Assessment & Plan Note (Signed)
Complaining of fatigue but weaned himself off of his CPAP. I did explain to him that without aggressive weight loss he would continue to have obstructive sleep apnea, which would likely continue to precipitate his fatigue. We will do lab work-up looking for other possible causes but I do not think will reveal anything else. I doubt long Covid is at play here, we have more well-defined obvious causes for his fatigue.

## 2020-06-06 NOTE — Assessment & Plan Note (Signed)
This is likely potentiating his fatigue and sleep apnea. Referral to the healthy [facility] wellness center. I have however recommended bariatric surgery as well.

## 2020-06-06 NOTE — Assessment & Plan Note (Signed)
Due for a few things, he is going to think about Shingrix vaccination.

## 2020-06-06 NOTE — Patient Instructions (Signed)
Zoster Vaccine, Recombinant injection What is this medicine? ZOSTER VACCINE (ZOS ter vak SEEN) is a vaccine used to reduce the risk of getting shingles. This vaccine is not used to treat shingles or nerve pain from shingles. This medicine may be used for other purposes; ask your health care provider or pharmacist if you have questions. COMMON BRAND NAME(S): [facility] What should I tell my health care provider before I take this medicine? They need to know if you have any of these conditions:  cancer  immune system problems  an unusual or allergic reaction to Zoster vaccine, other medications, foods, dyes, or preservatives  pregnant or trying to get pregnant  breast-feeding How should I use this medicine? This vaccine is injected into a muscle. It is given by a health care provider. A copy of Vaccine Information Statements will be given before each vaccination. Be sure to read this information carefully each time. This sheet may change often. Talk to your health care provider about the use of this vaccine in children. This vaccine is not approved for use in children. Overdosage: If you think you have taken too much of this medicine contact a poison control center or emergency room at once. NOTE: This medicine is only for you. Do not share this medicine with others. What if I miss a dose? Keep appointments for follow-up (booster) doses. It is important not to miss your dose. Call your health care provider if you are unable to keep an appointment. What may interact with this medicine?  medicines that suppress your immune system  medicines to treat cancer  steroid medicines like prednisone or cortisone This list may not describe all possible interactions. Give your health care provider a list of all the medicines, herbs, non-prescription drugs, or dietary supplements you use. Also tell them if you smoke, drink alcohol, or use illegal drugs. Some items may interact with your medicine. What  should I watch for while using this medicine? Visit your health care provider regularly. This vaccine, like all vaccines, may not fully protect everyone. What side effects may I notice from receiving this medicine? Side effects that you should report to your doctor or health care professional as soon as possible:  allergic reactions (skin rash, itching or hives; swelling of the face, lips, or tongue)  trouble breathing Side effects that usually do not require medical attention (report these to your doctor or health care professional if they continue or are bothersome):  chills  headache  fever  nausea  pain, redness, or irritation at site where injected  tiredness  vomiting This list may not describe all possible side effects. Call your doctor for medical advice about side effects. You may report side effects to FDA at 1-800-FDA-1088. Where should I keep my medicine? This vaccine is only given by a health care provider. It will not be stored at home. NOTE: This sheet is a summary. It may not cover all possible information. If you have questions about this medicine, talk to your doctor, pharmacist, or health care provider.  2021 Elsevier/Gold Standard (2019-05-25 16:23:07)

## 2020-06-06 NOTE — Assessment & Plan Note (Signed)
Has been out of blood pressure medicine for a few days, refilling.

## 2020-06-06 NOTE — Progress Notes (Signed)
    Procedures performed today:    None.  Independent interpretation of notes and tests performed by another provider:   None.  Brief History, Exam, Impression, and Recommendations:    Annual physical exam Due for a few things, he is going to think about Shingrix vaccination.  Hypertension, essential, benign Has been out of blood pressure medicine for a few days, refilling.  Male hypogonadism Rechecking testosterone and PSA. Labs need to be obtained approximately 1 week after his injection.  Obesity This is likely potentiating his fatigue and sleep apnea. Referral to the healthy weight and wellness center. I have however recommended bariatric surgery as well.  Obstructive sleep apnea Complaining of fatigue but weaned himself off of his CPAP. I did explain to him that without aggressive weight loss he would continue to have obstructive sleep apnea, which would likely continue to precipitate his fatigue. We will do lab work-up looking for other possible causes but I do not think will reveal anything else. I doubt long Covid is at play here, we have more well-defined obvious causes for his fatigue.    ___________________________________________ Gwen Her. Dianah Field, M.D., ABFM., CAQSM. Primary Care and [place] Instructor of [place] of [facility]

## 2020-06-19 ENCOUNTER — Ambulatory Visit: Payer: Self-pay

## 2020-07-02 ENCOUNTER — Ambulatory Visit (INDEPENDENT_AMBULATORY_CARE_PROVIDER_SITE_OTHER): Payer: Self-pay | Admitting: Sports Medicine

## 2020-07-02 ENCOUNTER — Other Ambulatory Visit: Payer: Self-pay

## 2020-07-02 VITALS — BP 140/70 | HR 81 | Temp 98.8°F | Resp 20 | Ht 73.0 in | Wt 284.0 lb

## 2020-07-02 DIAGNOSIS — E291 Testicular hypofunction: Secondary | ICD-10-CM

## 2020-07-02 MED ORDER — TESTOSTERONE CYPIONATE 200 MG/ML IM SOLN
200.0000 mg | Freq: Once | INTRAMUSCULAR | Status: AC
  Administered 2020-07-02: 200 mg via INTRAMUSCULAR

## 2020-07-02 NOTE — Progress Notes (Signed)
Established Patient Office Visit  Subjective:  Patient ID: Richard Beltran, male    DOB: [birth year]  Age: 55 y.o. MRN: [id number]  CC:  Chief Complaint  Patient presents with  . Hypogonadism    HPI Richard Beltran presents for a testosterone injection. Reports no chest pain, shortness of breath or mood changes.   Past Medical History:  Diagnosis Date  . Hypertension   . Lumbar degenerative disc disease 10/29/2013   Post L4-L5 fusion.   . Male hypogonadism     Past Surgical History:  Procedure Laterality Date  . BACK SURGERY  2005    Family History  Problem Relation Age of Onset  . Diabetes Mother   . Hyperlipidemia Mother   . Healthy Father   . Healthy Maternal Grandmother   . Healthy Maternal Grandfather   . Healthy Paternal Grandmother   . Healthy Paternal Grandfather   . Colon cancer Neg Hx   . Colon polyps Neg Hx   . Esophageal cancer Neg Hx   . Rectal cancer Neg Hx   . Stomach cancer Neg Hx     Social History   Socioeconomic History  . Marital status: Married    Spouse name: Not on file  . Number of children: 4  . Years of education: 18+  . Highest education level: Not on file  Occupational History  . Occupation: Database administrator  Tobacco Use  . Smoking status: Never Smoker  . Smokeless tobacco: Never Used  Vaping Use  . Vaping Use: Never used  Substance and Sexual Activity  . Alcohol use: No  . Drug use: No  . Sexual activity: Not on file  Other Topics Concern  . Not on file  Social History Narrative   Fun: Golf and read   Denies religious beliefs effecting healthcare.    Social Determinants of Health   Financial Resource Strain: Not on file  Food Insecurity: Not on file  Transportation Needs: Not on file  Physical Activity: Not on file  Stress: Not on file  Social Connections: Not on file  Intimate Partner Violence: Not on file    Outpatient Medications Prior to Visit  Medication Sig Dispense Refill  . amLODipine (NORVASC) 10 MG  tablet Take 1 tablet (10 mg total) by mouth daily. 90 tablet 3  . carvedilol (COREG) 25 MG tablet Take 1 tablet (25 mg total) by mouth 2 (two) times daily with a meal. 180 tablet 1  . lisinopril-hydrochlorothiazide (ZESTORETIC) 20-25 MG tablet Take 1 tablet by mouth daily. 90 tablet 3  . meloxicam (MOBIC) 15 MG tablet TAKE 1 TABLET BY MOUTH EVERY DAY AS NEEDED FOR PAIN 90 tablet 1  . traMADol (ULTRAM) 50 MG tablet Take 1-2 tablets (50-100 mg total) by mouth 3 (three) times daily as needed (pain). 180 tablet 0   Facility-Administered Medications Prior to Visit  Medication Dose Route Frequency Provider Last Rate Last Admin  . testosterone cypionate (DEPOTESTOSTERONE CYPIONATE) injection 200 mg  200 mg Intramuscular Q14 Days Silverio Decamp, MD   200 mg at 05/06/20 0845    Allergies  Allergen Reactions  . Eggs Or Egg-Derived Products Nausea And Vomiting    ROS Review of Systems    Objective:    Physical Exam  BP 140/70 (BP Location: Right Arm, Patient Position: Sitting, Cuff Size: Large)   Pulse 81   Temp 98.8 F (37.1 C) (Oral)   Resp 20   Ht 6\' 1"  (1.854 m)   Wt 284 lb (128.8 kg)  SpO2 99%   BMI 37.47 kg/m  Wt Readings from Last 3 Encounters:  07/02/20 284 lb (128.8 kg)  05/21/20 286 lb (129.7 kg)  05/06/20 286 lb (129.7 kg)     Health Maintenance Due  Topic Date Due  . COVID-19 Vaccine (3 - Booster for Pfizer series) 02/13/2020    There are no preventive care reminders to display for this patient.  Lab Results  Component Value Date   TSH 1.31 08/30/2019   Lab Results  Component Value Date   WBC 6.2 08/30/2019   HGB 14.6 08/30/2019   HCT 45.2 08/30/2019   MCV 89.9 08/30/2019   PLT 249 08/30/2019   Lab Results  Component Value Date   NA 138 08/30/2019   K 4.1 08/30/2019   CO2 28 08/30/2019   GLUCOSE 99 08/30/2019   BUN 11 08/30/2019   CREATININE 1.16 08/30/2019   BILITOT 0.5 08/30/2019   ALKPHOS 76 12/08/2016   AST 17 08/30/2019   ALT 16  08/30/2019   PROT 6.9 08/30/2019   ALBUMIN 4.2 12/08/2016   CALCIUM 9.4 08/30/2019   Lab Results  Component Value Date   CHOL 218 (H) 08/30/2019   Lab Results  Component Value Date   HDL 37 (L) 08/30/2019   Lab Results  Component Value Date   LDLCALC 151 (H) 08/30/2019   Lab Results  Component Value Date   TRIG 167 (H) 08/30/2019   Lab Results  Component Value Date   CHOLHDL 5.9 (H) 08/30/2019   Lab Results  Component Value Date   HGBA1C 4.6 08/30/2019      Assessment & Plan:  Pt tolerated testosterone injection well without any complications. Pt will return in 14 days for next testosterone injection.  Problem List Items Addressed This Visit      Endocrine   Male hypogonadism - Primary      Meds ordered this encounter  Medications  . testosterone cypionate (DEPOTESTOSTERONE CYPIONATE) injection 200 mg    Follow-up: Return in about 2 weeks (around 07/16/2020) for Testosterone Injection .    Ninfa Meeker, CMA

## 2020-07-16 ENCOUNTER — Ambulatory Visit: Payer: Self-pay

## 2020-07-30 ENCOUNTER — Ambulatory Visit (INDEPENDENT_AMBULATORY_CARE_PROVIDER_SITE_OTHER): Payer: Self-pay | Admitting: Sports Medicine

## 2020-07-30 ENCOUNTER — Other Ambulatory Visit: Payer: Self-pay

## 2020-07-30 DIAGNOSIS — E291 Testicular hypofunction: Secondary | ICD-10-CM

## 2020-07-30 DIAGNOSIS — M5136 Other intervertebral disc degeneration, lumbar region: Secondary | ICD-10-CM

## 2020-07-30 MED ORDER — TRAMADOL HCL 50 MG PO TABS: 50.0000 mg | ORAL_TABLET | Freq: Three times a day (TID) | ORAL | 0 refills | Status: DC | PRN

## 2020-07-30 NOTE — Progress Notes (Signed)
Pt here for testosterone injection no SOB,CP or mood swings. Injection tolerated well given in LUOQ  Pt to rtc in 14 days for next injection.  He asked for a refill on Tramadol to be sent to his pharmacy

## 2020-08-13 ENCOUNTER — Ambulatory Visit (INDEPENDENT_AMBULATORY_CARE_PROVIDER_SITE_OTHER): Payer: Self-pay | Admitting: Sports Medicine

## 2020-08-13 ENCOUNTER — Other Ambulatory Visit: Payer: Self-pay

## 2020-08-13 VITALS — BP 137/73 | HR 69 | Temp 98.6°F | Resp 20 | Ht 73.0 in | Wt 287.0 lb

## 2020-08-13 DIAGNOSIS — E291 Testicular hypofunction: Secondary | ICD-10-CM

## 2020-08-13 MED ORDER — TESTOSTERONE CYPIONATE 200 MG/ML IM SOLN
200.0000 mg | Freq: Once | INTRAMUSCULAR | Status: AC
  Administered 2020-08-13: 200 mg via INTRAMUSCULAR

## 2020-08-13 NOTE — Progress Notes (Signed)
Established Patient Office Visit  Subjective:  Patient ID: Richard Beltran, male    DOB: Aug 26, 1965  Age: 55 y.o. MRN: [id number]  CC:  Chief Complaint  Patient presents with  . Male Hypogonadism    HPI Richard Beltran presents for a testosterone injection. Denies chest pain, shortness of breath, headaches, and problems with medication or mood changes.    Patient tolerated testosterone 200 mg injection to RUOQ well without complications. Patient advised to schedule next injection in 14 days.   Past Medical History:  Diagnosis Date  . Hypertension   . Lumbar degenerative disc disease 10/29/2013   Post L4-L5 fusion.   . Male hypogonadism     Past Surgical History:  Procedure Laterality Date  . BACK SURGERY  2005    Family History  Problem Relation Age of Onset  . Diabetes Mother   . Hyperlipidemia Mother   . Healthy Father   . Healthy Maternal Grandmother   . Healthy Maternal Grandfather   . Healthy Paternal Grandmother   . Healthy Paternal Grandfather   . Colon cancer Neg Hx   . Colon polyps Neg Hx   . Esophageal cancer Neg Hx   . Rectal cancer Neg Hx   . Stomach cancer Neg Hx     Social History   Socioeconomic History  . Marital status: Married    Spouse name: Not on file  . Number of children: 4  . Years of education: 18+  . Highest education level: Not on file  Occupational History  . Occupation: Database administrator  Tobacco Use  . Smoking status: Never Smoker  . Smokeless tobacco: Never Used  Vaping Use  . Vaping Use: Never used  Substance and Sexual Activity  . Alcohol use: No  . Drug use: No  . Sexual activity: Not on file  Other Topics Concern  . Not on file  Social History Narrative   Fun: Golf and read   Denies religious beliefs effecting healthcare.    Social Determinants of Health   Financial Resource Strain: Not on file  Food Insecurity: Not on file  Transportation Needs: Not on file  Physical Activity: Not on file  Stress: Not on file   Social Connections: Not on file  Intimate Partner Violence: Not on file    Outpatient Medications Prior to Visit  Medication Sig Dispense Refill  . amLODipine (NORVASC) 10 MG tablet Take 1 tablet (10 mg total) by mouth daily. 90 tablet 3  . carvedilol (COREG) 25 MG tablet Take 1 tablet (25 mg total) by mouth 2 (two) times daily with a meal. 180 tablet 1  . lisinopril-hydrochlorothiazide (ZESTORETIC) 20-25 MG tablet Take 1 tablet by mouth daily. 90 tablet 3  . meloxicam (MOBIC) 15 MG tablet TAKE 1 TABLET BY MOUTH EVERY DAY AS NEEDED FOR PAIN 90 tablet 1  . traMADol (ULTRAM) 50 MG tablet Take 1-2 tablets (50-100 mg total) by mouth 3 (three) times daily as needed (pain). 180 tablet 0   Facility-Administered Medications Prior to Visit  Medication Dose Route Frequency Provider Last Rate Last Admin  . testosterone cypionate (DEPOTESTOSTERONE CYPIONATE) injection 200 mg  200 mg Intramuscular Q14 Days Richard Decamp, MD   200 mg at 07/30/20 1093    Allergies  Allergen Reactions  . Eggs Or Egg-Derived Products Nausea And Vomiting    ROS Review of Systems    Objective:    Physical Exam  BP 137/73 (BP Location: Left Arm, Patient Position: Sitting, Cuff Size: Large)  Pulse 69   Temp 98.6 F (37 C) (Oral)   Resp 20   Ht 6\' 1"  (1.854 m)   Wt 287 lb (130.2 kg)   SpO2 97%   BMI 37.87 kg/m  Wt Readings from Last 3 Encounters:  08/13/20 287 lb (130.2 kg)  07/02/20 284 lb (128.8 kg)  05/21/20 286 lb (129.7 kg)     Health Maintenance Due  Topic Date Due  . COVID-19 Vaccine (3 - Booster for Pfizer series) 02/13/2020    There are no preventive care reminders to display for this patient.  Lab Results  Component Value Date   TSH 1.31 08/30/2019   Lab Results  Component Value Date   WBC 6.2 08/30/2019   HGB 14.6 08/30/2019   HCT 45.2 08/30/2019   MCV 89.9 08/30/2019   PLT 249 08/30/2019   Lab Results  Component Value Date   NA 138 08/30/2019   K 4.1 08/30/2019    CO2 28 08/30/2019   GLUCOSE 99 08/30/2019   BUN 11 08/30/2019   CREATININE 1.16 08/30/2019   BILITOT 0.5 08/30/2019   ALKPHOS 76 12/08/2016   AST 17 08/30/2019   ALT 16 08/30/2019   PROT 6.9 08/30/2019   ALBUMIN 4.2 12/08/2016   CALCIUM 9.4 08/30/2019   Lab Results  Component Value Date   CHOL 218 (H) 08/30/2019   Lab Results  Component Value Date   HDL 37 (L) 08/30/2019   Lab Results  Component Value Date   LDLCALC 151 (H) 08/30/2019   Lab Results  Component Value Date   TRIG 167 (H) 08/30/2019   Lab Results  Component Value Date   CHOLHDL 5.9 (H) 08/30/2019   Lab Results  Component Value Date   HGBA1C 4.6 08/30/2019      Assessment & Plan:  Patient tolerated testosterone 200 mg injection to RUOQ well without complications. Patient advised to schedule next injection in 14 days.  Problem List Items Addressed This Visit      Endocrine   Male hypogonadism - Primary      Meds ordered this encounter  Medications  . testosterone cypionate (DEPOTESTOSTERONE CYPIONATE) injection 200 mg    Follow-up: Return in about 2 weeks (around 08/27/2020) for Testosterone Injection.    Richard Beltran, CMA

## 2020-08-27 ENCOUNTER — Ambulatory Visit (INDEPENDENT_AMBULATORY_CARE_PROVIDER_SITE_OTHER): Payer: Self-pay | Admitting: Sports Medicine

## 2020-08-27 ENCOUNTER — Other Ambulatory Visit: Payer: Self-pay

## 2020-08-27 VITALS — BP 140/81 | HR 74

## 2020-08-27 DIAGNOSIS — E291 Testicular hypofunction: Secondary | ICD-10-CM

## 2020-08-27 DIAGNOSIS — M5136 Other intervertebral disc degeneration, lumbar region: Secondary | ICD-10-CM

## 2020-08-27 MED ORDER — MELOXICAM 15 MG PO TABS: 15.0000 mg | ORAL_TABLET | Freq: Every day | ORAL | 1 refills | Status: DC

## 2020-08-27 MED ORDER — TESTOSTERONE CYPIONATE 200 MG/ML IM SOLN
200.0000 mg | Freq: Once | INTRAMUSCULAR | Status: AC
  Administered 2020-08-27: 200 mg via INTRAMUSCULAR

## 2020-08-27 MED ORDER — TRAMADOL HCL 50 MG PO TABS: 50.0000 mg | ORAL_TABLET | Freq: Three times a day (TID) | ORAL | 0 refills | Status: DC | PRN

## 2020-08-27 MED ORDER — AMLODIPINE BESYLATE 10 MG PO TABS: 10.0000 mg | ORAL_TABLET | Freq: Every day | ORAL | 3 refills | Status: DC

## 2020-08-27 NOTE — Progress Notes (Signed)
Patient advised to have fasting/testosterone labs done next Wednesday.

## 2020-08-27 NOTE — Progress Notes (Signed)
Established Patient Office Visit  Subjective:  Patient ID: Richard Beltran, male    DOB: March 15, 1966  Age: 55 y.o. MRN: [id number]  CC:  Chief Complaint  Patient presents with  . Hypogonadism    HPI Richard Beltran is here for a testosterone injection. Denies chest pain, shortness of breath, headaches or mood changes.   Past Medical History:  Diagnosis Date  . Hypertension   . Lumbar degenerative disc disease 10/29/2013   Post L4-L5 fusion.   . Male hypogonadism     Past Surgical History:  Procedure Laterality Date  . BACK SURGERY  2005    Family History  Problem Relation Age of Onset  . Diabetes Mother   . Hyperlipidemia Mother   . Healthy Father   . Healthy Maternal Grandmother   . Healthy Maternal Grandfather   . Healthy Paternal Grandmother   . Healthy Paternal Grandfather   . Colon cancer Neg Hx   . Colon polyps Neg Hx   . Esophageal cancer Neg Hx   . Rectal cancer Neg Hx   . Stomach cancer Neg Hx     Social History   Socioeconomic History  . Marital status: Married    Spouse name: Not on file  . Number of children: 4  . Years of education: 18+  . Highest education level: Not on file  Occupational History  . Occupation: Database administrator  Tobacco Use  . Smoking status: Never Smoker  . Smokeless tobacco: Never Used  Vaping Use  . Vaping Use: Never used  Substance and Sexual Activity  . Alcohol use: No  . Drug use: No  . Sexual activity: Not on file  Other Topics Concern  . Not on file  Social History Narrative   Fun: Golf and read   Denies religious beliefs effecting healthcare.    Social Determinants of Health   Financial Resource Strain: Not on file  Food Insecurity: Not on file  Transportation Needs: Not on file  Physical Activity: Not on file  Stress: Not on file  Social Connections: Not on file  Intimate Partner Violence: Not on file    Outpatient Medications Prior to Visit  Medication Sig Dispense Refill  . carvedilol (COREG) 25 MG  tablet Take 1 tablet (25 mg total) by mouth 2 (two) times daily with a meal. 180 tablet 1  . lisinopril-hydrochlorothiazide (ZESTORETIC) 20-25 MG tablet Take 1 tablet by mouth daily. 90 tablet 3  . traMADol (ULTRAM) 50 MG tablet Take 1-2 tablets (50-100 mg total) by mouth 3 (three) times daily as needed (pain). 180 tablet 0  . amLODipine (NORVASC) 10 MG tablet Take 1 tablet (10 mg total) by mouth daily. 90 tablet 3  . meloxicam (MOBIC) 15 MG tablet TAKE 1 TABLET BY MOUTH EVERY DAY AS NEEDED FOR PAIN 90 tablet 1   Facility-Administered Medications Prior to Visit  Medication Dose Route Frequency Provider Last Rate Last Admin  . testosterone cypionate (DEPOTESTOSTERONE CYPIONATE) injection 200 mg  200 mg Intramuscular Q14 Days Silverio Decamp, MD   200 mg at 07/30/20 7858    Allergies  Allergen Reactions  . Eggs Or Egg-Derived Products Nausea And Vomiting    ROS Review of Systems    Objective:    Physical Exam  BP 140/81   Pulse 74   SpO2 98%  Wt Readings from Last 3 Encounters:  08/13/20 287 lb (130.2 kg)  07/02/20 284 lb (128.8 kg)  05/21/20 286 lb (129.7 kg)     Health Maintenance  Due  Topic Date Due  . COVID-19 Vaccine (3 - Booster for Pfizer series) 02/13/2020    There are no preventive care reminders to display for this patient.  Lab Results  Component Value Date   TSH 1.31 08/30/2019   Lab Results  Component Value Date   WBC 6.2 08/30/2019   HGB 14.6 08/30/2019   HCT 45.2 08/30/2019   MCV 89.9 08/30/2019   PLT 249 08/30/2019   Lab Results  Component Value Date   NA 138 08/30/2019   K 4.1 08/30/2019   CO2 28 08/30/2019   GLUCOSE 99 08/30/2019   BUN 11 08/30/2019   CREATININE 1.16 08/30/2019   BILITOT 0.5 08/30/2019   ALKPHOS 76 12/08/2016   AST 17 08/30/2019   ALT 16 08/30/2019   PROT 6.9 08/30/2019   ALBUMIN 4.2 12/08/2016   CALCIUM 9.4 08/30/2019   Lab Results  Component Value Date   CHOL 218 (H) 08/30/2019   Lab Results  Component  Value Date   HDL 37 (L) 08/30/2019   Lab Results  Component Value Date   LDLCALC 151 (H) 08/30/2019   Lab Results  Component Value Date   TRIG 167 (H) 08/30/2019   Lab Results  Component Value Date   CHOLHDL 5.9 (H) 08/30/2019   Lab Results  Component Value Date   HGBA1C 4.6 08/30/2019      Assessment & Plan:  Hypogonadism - Patient tolerated injection well without complications. Patient advised to schedule next injection 14 days from today.   Lumbar degenerative disc disease - He is needing a refill on Tramadol.   Problem List Items Addressed This Visit    Lumbar degenerative disc disease   Relevant Medications   meloxicam (MOBIC) 15 MG tablet   Male hypogonadism - Primary      Meds ordered this encounter  Medications  . testosterone cypionate (DEPOTESTOSTERONE CYPIONATE) injection 200 mg  . amLODipine (NORVASC) 10 MG tablet    Sig: Take 1 tablet (10 mg total) by mouth daily.    Dispense:  90 tablet    Refill:  3  . meloxicam (MOBIC) 15 MG tablet    Sig: Take 1 tablet (15 mg total) by mouth daily.    Dispense:  90 tablet    Refill:  1    Follow-up: Return in about 2 weeks (around 09/10/2020) for testosterone injection. Durene Romans, Monico Blitz, [place]

## 2020-09-02 ENCOUNTER — Ambulatory Visit (INDEPENDENT_AMBULATORY_CARE_PROVIDER_SITE_OTHER): Payer: Self-pay | Admitting: Family Medicine

## 2020-09-02 DIAGNOSIS — Z1331 Encounter for screening for depression: Secondary | ICD-10-CM

## 2020-09-02 DIAGNOSIS — R5383 Other fatigue: Secondary | ICD-10-CM

## 2020-09-11 ENCOUNTER — Ambulatory Visit (INDEPENDENT_AMBULATORY_CARE_PROVIDER_SITE_OTHER): Payer: Self-pay | Admitting: Family Medicine

## 2020-09-11 ENCOUNTER — Other Ambulatory Visit: Payer: Self-pay

## 2020-09-11 ENCOUNTER — Other Ambulatory Visit: Payer: Self-pay | Admitting: Sports Medicine

## 2020-09-11 VITALS — BP 140/80 | HR 63 | Temp 98.0°F | Resp 20 | Ht 73.0 in

## 2020-09-11 DIAGNOSIS — E291 Testicular hypofunction: Secondary | ICD-10-CM

## 2020-09-11 MED ORDER — TESTOSTERONE CYPIONATE 200 MG/ML IM SOLN
200.0000 mg | Freq: Once | INTRAMUSCULAR | Status: AC
  Administered 2020-09-11: 200 mg via INTRAMUSCULAR

## 2020-09-11 NOTE — Progress Notes (Signed)
Medical screening examination/treatment was performed by qualified clinical staff member and as supervising physician I was immediately available for consultation/collaboration. I have reviewed documentation and agree with assessment and plan.  Hang Ammon, DO  

## 2020-09-11 NOTE — Progress Notes (Signed)
Established Patient Office Visit  Subjective:  Patient ID: Richard Beltran, male    DOB: October 22, 1965  Age: 55 y.o. MRN: [id number]  CC:  Chief Complaint  Patient presents with  . Male Hypogonadism    HPI RAHIL PASSEY presents for a testosterone injection. Denies chest pain, shortness of breath, headaches, and problems with medication or mood changes.    Patient tolerated testosterone 200 mg injection to RUOQ well without complications. Patient advised to schedule next injection in 14 days.   Past Medical History:  Diagnosis Date  . Hypertension   . Lumbar degenerative disc disease 10/29/2013   Post L4-L5 fusion.   . Male hypogonadism     Past Surgical History:  Procedure Laterality Date  . BACK SURGERY  2005    Family History  Problem Relation Age of Onset  . Diabetes Mother   . Hyperlipidemia Mother   . Healthy Father   . Healthy Maternal Grandmother   . Healthy Maternal Grandfather   . Healthy Paternal Grandmother   . Healthy Paternal Grandfather   . Colon cancer Neg Hx   . Colon polyps Neg Hx   . Esophageal cancer Neg Hx   . Rectal cancer Neg Hx   . Stomach cancer Neg Hx     Social History   Socioeconomic History  . Marital status: Married    Spouse name: Not on file  . Number of children: 4  . Years of education: 18+  . Highest education level: Not on file  Occupational History  . Occupation: Database administrator  Tobacco Use  . Smoking status: Never Smoker  . Smokeless tobacco: Never Used  Vaping Use  . Vaping Use: Never used  Substance and Sexual Activity  . Alcohol use: No  . Drug use: No  . Sexual activity: Not on file  Other Topics Concern  . Not on file  Social History Narrative   Fun: Golf and read   Denies religious beliefs effecting healthcare.    Social Determinants of Health   Financial Resource Strain: Not on file  Food Insecurity: Not on file  Transportation Needs: Not on file  Physical Activity: Not on file  Stress: Not on file   Social Connections: Not on file  Intimate Partner Violence: Not on file    Outpatient Medications Prior to Visit  Medication Sig Dispense Refill  . amLODipine (NORVASC) 10 MG tablet Take 1 tablet (10 mg total) by mouth daily. 90 tablet 3  . carvedilol (COREG) 25 MG tablet Take 1 tablet (25 mg total) by mouth 2 (two) times daily with a meal. 180 tablet 1  . lisinopril-hydrochlorothiazide (ZESTORETIC) 20-25 MG tablet Take 1 tablet by mouth daily. 90 tablet 3  . meloxicam (MOBIC) 15 MG tablet Take 1 tablet (15 mg total) by mouth daily. 90 tablet 1  . traMADol (ULTRAM) 50 MG tablet Take 1-2 tablets (50-100 mg total) by mouth 3 (three) times daily as needed (pain). 180 tablet 0   Facility-Administered Medications Prior to Visit  Medication Dose Route Frequency Provider Last Rate Last Admin  . testosterone cypionate (DEPOTESTOSTERONE CYPIONATE) injection 200 mg  200 mg Intramuscular Q14 Days Silverio Decamp, MD   200 mg at 07/30/20 9983    Allergies  Allergen Reactions  . Eggs Or Egg-Derived Products Nausea And Vomiting    ROS Review of Systems    Objective:    Physical Exam  BP 140/80 (BP Location: Left Arm, Patient Position: Sitting, Cuff Size: Normal)   Pulse 63  Temp 98 F (36.7 C) (Oral)   Resp 20   Ht 6\' 1"  (1.854 m)   SpO2 99%   BMI 37.87 kg/m  Wt Readings from Last 3 Encounters:  08/13/20 287 lb (130.2 kg)  07/02/20 284 lb (128.8 kg)  05/21/20 286 lb (129.7 kg)     Health Maintenance Due  Topic Date Due  . COVID-19 Vaccine (3 - Booster for Pfizer series) 01/14/2020    There are no preventive care reminders to display for this patient.  Lab Results  Component Value Date   TSH 1.31 08/30/2019   Lab Results  Component Value Date   WBC 6.2 08/30/2019   HGB 14.6 08/30/2019   HCT 45.2 08/30/2019   MCV 89.9 08/30/2019   PLT 249 08/30/2019   Lab Results  Component Value Date   NA 138 08/30/2019   K 4.1 08/30/2019   CO2 28 08/30/2019   GLUCOSE  99 08/30/2019   BUN 11 08/30/2019   CREATININE 1.16 08/30/2019   BILITOT 0.5 08/30/2019   ALKPHOS 76 12/08/2016   AST 17 08/30/2019   ALT 16 08/30/2019   PROT 6.9 08/30/2019   ALBUMIN 4.2 12/08/2016   CALCIUM 9.4 08/30/2019   Lab Results  Component Value Date   CHOL 218 (H) 08/30/2019   Lab Results  Component Value Date   HDL 37 (L) 08/30/2019   Lab Results  Component Value Date   LDLCALC 151 (H) 08/30/2019   Lab Results  Component Value Date   TRIG 167 (H) 08/30/2019   Lab Results  Component Value Date   CHOLHDL 5.9 (H) 08/30/2019   Lab Results  Component Value Date   HGBA1C 4.6 08/30/2019      Assessment & Plan:  Patient tolerated testosterone 200 mg injection to RUOQ well without complications. Patient advised to schedule next injection in 14 days.  Problem List Items Addressed This Visit      Endocrine   Male hypogonadism - Primary      Meds ordered this encounter  Medications  . testosterone cypionate (DEPOTESTOSTERONE CYPIONATE) injection 200 mg    Follow-up: Return in about 2 weeks (around 09/25/2020) for Testosterone Injection.    Ninfa Meeker, CMA

## 2020-09-16 ENCOUNTER — Ambulatory Visit (INDEPENDENT_AMBULATORY_CARE_PROVIDER_SITE_OTHER): Payer: Self-pay | Admitting: Family Medicine

## 2020-09-25 ENCOUNTER — Ambulatory Visit: Payer: Self-pay

## 2020-10-02 ENCOUNTER — Encounter (INDEPENDENT_AMBULATORY_CARE_PROVIDER_SITE_OTHER): Payer: Self-pay | Admitting: Family Medicine

## 2020-10-02 ENCOUNTER — Other Ambulatory Visit: Payer: Self-pay

## 2020-10-02 ENCOUNTER — Ambulatory Visit (INDEPENDENT_AMBULATORY_CARE_PROVIDER_SITE_OTHER): Payer: 59 | Admitting: Family Medicine

## 2020-10-02 VITALS — BP 121/69 | HR 59 | Temp 98.1°F | Ht 72.0 in | Wt 275.0 lb

## 2020-10-02 DIAGNOSIS — Z0289 Encounter for other administrative examinations: Secondary | ICD-10-CM

## 2020-10-02 DIAGNOSIS — Z1331 Encounter for screening for depression: Secondary | ICD-10-CM | POA: Diagnosis not present

## 2020-10-02 DIAGNOSIS — R0602 Shortness of breath: Secondary | ICD-10-CM | POA: Diagnosis not present

## 2020-10-02 DIAGNOSIS — E782 Mixed hyperlipidemia: Secondary | ICD-10-CM | POA: Diagnosis not present

## 2020-10-02 DIAGNOSIS — G4733 Obstructive sleep apnea (adult) (pediatric): Secondary | ICD-10-CM

## 2020-10-02 DIAGNOSIS — Z9189 Other specified personal risk factors, not elsewhere classified: Secondary | ICD-10-CM

## 2020-10-02 DIAGNOSIS — E66812 Obesity, class 2: Secondary | ICD-10-CM

## 2020-10-02 DIAGNOSIS — Z6837 Body mass index (BMI) 37.0-37.9, adult: Secondary | ICD-10-CM

## 2020-10-02 DIAGNOSIS — I10 Essential (primary) hypertension: Secondary | ICD-10-CM

## 2020-10-02 DIAGNOSIS — R5383 Other fatigue: Secondary | ICD-10-CM

## 2020-10-03 LAB — CBC WITH DIFFERENTIAL/PLATELET
Basophils Absolute: 0 10*3/uL (ref 0.0–0.2)
Basos: 1 %
EOS (ABSOLUTE): 0.2 10*3/uL (ref 0.0–0.4)
Eos: 4 %
Hematocrit: 45.6 % (ref 37.5–51.0)
Hemoglobin: 14.6 g/dL (ref 13.0–17.7)
Immature Grans (Abs): 0 10*3/uL (ref 0.0–0.1)
Immature Granulocytes: 0 %
Lymphocytes Absolute: 2.3 10*3/uL (ref 0.7–3.1)
Lymphs: 38 %
MCH: 29.4 pg (ref 26.6–33.0)
MCHC: 32 g/dL (ref 31.5–35.7)
MCV: 92 fL (ref 79–97)
Monocytes Absolute: 0.4 10*3/uL (ref 0.1–0.9)
Monocytes: 7 %
Neutrophils Absolute: 3.1 10*3/uL (ref 1.4–7.0)
Neutrophils: 50 %
Platelets: 226 10*3/uL (ref 150–450)
RBC: 4.97 x10E6/uL (ref 4.14–5.80)
RDW: 12.8 % (ref 11.6–15.4)
WBC: 6.1 10*3/uL (ref 3.4–10.8)

## 2020-10-03 LAB — COMPREHENSIVE METABOLIC PANEL
ALT: 22 IU/L (ref 0–44)
AST: 19 IU/L (ref 0–40)
Albumin/Globulin Ratio: 1.4 (ref 1.2–2.2)
Albumin: 4.2 g/dL (ref 3.8–4.9)
Alkaline Phosphatase: 60 IU/L (ref 44–121)
BUN/Creatinine Ratio: 15 (ref 9–20)
BUN: 19 mg/dL (ref 6–24)
Bilirubin Total: 0.6 mg/dL (ref 0.0–1.2)
CO2: 25 mmol/L (ref 20–29)
Calcium: 9.8 mg/dL (ref 8.7–10.2)
Chloride: 101 mmol/L (ref 96–106)
Creatinine, Ser: 1.28 mg/dL — ABNORMAL HIGH (ref 0.76–1.27)
Globulin, Total: 3.1 g/dL (ref 1.5–4.5)
Glucose: 81 mg/dL (ref 65–99)
Potassium: 4.2 mmol/L (ref 3.5–5.2)
Sodium: 141 mmol/L (ref 134–144)
Total Protein: 7.3 g/dL (ref 6.0–8.5)
eGFR: 67 mL/min/{1.73_m2} (ref >59)

## 2020-10-03 LAB — LIPID PANEL WITH LDL/HDL RATIO
Cholesterol, Total: 209 mg/dL — ABNORMAL HIGH (ref 100–199)
HDL: 38 mg/dL — ABNORMAL LOW (ref >39)
LDL Chol Calc (NIH): 145 mg/dL — ABNORMAL HIGH (ref 0–99)
LDL/HDL Ratio: 3.8 ratio — ABNORMAL HIGH (ref 0.0–3.6)
Triglycerides: 143 mg/dL (ref 0–149)
VLDL Cholesterol Cal: 26 mg/dL (ref 5–40)

## 2020-10-03 LAB — INSULIN, RANDOM: INSULIN: 22.7 u[IU]/mL (ref 2.6–24.9)

## 2020-10-03 LAB — HEMOGLOBIN A1C
Est. average glucose Bld gHb Est-mCnc: 100 mg/dL
Hgb A1c MFr Bld: 5.1 % (ref 4.8–5.6)

## 2020-10-03 LAB — VITAMIN D 25 HYDROXY (VIT D DEFICIENCY, FRACTURES): Vit D, 25-Hydroxy: 14.5 ng/mL — ABNORMAL LOW (ref 30.0–100.0)

## 2020-10-03 LAB — FOLATE: Folate: 6.2 ng/mL (ref >3.0)

## 2020-10-03 LAB — T4: T4, Total: 7.2 ug/dL (ref 4.5–12.0)

## 2020-10-03 LAB — TSH: TSH: 1.5 u[IU]/mL (ref 0.450–4.500)

## 2020-10-03 LAB — T3: T3, Total: 160 ng/dL (ref 71–180)

## 2020-10-03 LAB — VITAMIN B12: Vitamin B-12: 322 pg/mL (ref 232–1245)

## 2020-10-07 ENCOUNTER — Other Ambulatory Visit: Payer: Self-pay

## 2020-10-07 ENCOUNTER — Ambulatory Visit (INDEPENDENT_AMBULATORY_CARE_PROVIDER_SITE_OTHER): Payer: Self-pay | Admitting: Sports Medicine

## 2020-10-07 VITALS — BP 139/71 | HR 60

## 2020-10-07 DIAGNOSIS — E291 Testicular hypofunction: Secondary | ICD-10-CM

## 2020-10-07 DIAGNOSIS — M5136 Other intervertebral disc degeneration, lumbar region: Secondary | ICD-10-CM

## 2020-10-07 DIAGNOSIS — M51369 Other intervertebral disc degeneration, lumbar region without mention of lumbar back pain or lower extremity pain: Secondary | ICD-10-CM

## 2020-10-07 MED ORDER — TRAMADOL HCL 50 MG PO TABS: 50.0000 mg | ORAL_TABLET | Freq: Three times a day (TID) | ORAL | 0 refills | Status: DC | PRN

## 2020-10-07 MED ORDER — TESTOSTERONE CYPIONATE 200 MG/ML IM SOLN
200.0000 mg | Freq: Once | INTRAMUSCULAR | Status: AC
  Administered 2020-10-07: 200 mg via INTRAMUSCULAR

## 2020-10-07 NOTE — Progress Notes (Signed)
Testosterone given LUOQ without immediate complications.  Pt requested a refill of Tramadol.  Medication pended to correct pharmacy.

## 2020-10-13 ENCOUNTER — Other Ambulatory Visit: Payer: Self-pay

## 2020-10-13 ENCOUNTER — Emergency Department
Admission: EM | Admit: 2020-10-13 | Discharge: 2020-10-13 | Disposition: A | Discharge status: Home / Self Care | Payer: Self-pay | Source: Home / Self Care

## 2020-10-13 ENCOUNTER — Emergency Department (INDEPENDENT_AMBULATORY_CARE_PROVIDER_SITE_OTHER): Payer: 59

## 2020-10-13 ENCOUNTER — Encounter: Payer: Self-pay | Admitting: Emergency Medicine

## 2020-10-13 DIAGNOSIS — M25462 Effusion, left knee: Secondary | ICD-10-CM

## 2020-10-13 DIAGNOSIS — M25562 Pain in left knee: Secondary | ICD-10-CM | POA: Diagnosis not present

## 2020-10-13 DIAGNOSIS — M1712 Unilateral primary osteoarthritis, left knee: Secondary | ICD-10-CM | POA: Diagnosis not present

## 2020-10-13 MED ORDER — DEXAMETHASONE SODIUM PHOSPHATE 10 MG/ML IJ SOLN
10.0000 mg | Freq: Once | INTRAMUSCULAR | Status: AC
  Administered 2020-10-13: 10 mg via INTRAMUSCULAR

## 2020-10-13 MED ORDER — KETOROLAC TROMETHAMINE 30 MG/ML IJ SOLN
30.0000 mg | Freq: Once | INTRAMUSCULAR | Status: AC
  Administered 2020-10-13: 30 mg via INTRAMUSCULAR

## 2020-10-13 NOTE — Progress Notes (Signed)
Dear Dr. Dianah Beltran,   Thank you for referring Richard Beltran to our clinic. The following note includes my evaluation and treatment recommendations.  Chief Complaint:   OBESITY Richard Beltran (MR# [id number]) is a 55 y.o. male who presents for evaluation and treatment of obesity and related comorbidities. Current BMI is Body mass index is 37.3 kg/m. Richard Beltran has been struggling with his weight for many years and has been unsuccessful in either losing weight, maintaining weight loss, or reaching his healthy weight goal.  Richard Beltran is currently in the action stage of change and ready to dedicate time achieving and maintaining a healthier weight. Richard Beltran is interested in becoming our patient and working on intensive lifestyle modifications including (but not limited to) diet and exercise for weight loss.  Richard Beltran was referred by Dr. Dianah Beltran. He is Scientist, water quality in [employer]. He eats quite a bit of fried food and take out. He skips breakfast and sometimes lunch. He drinks coffee in the morning with little cream; 12-1 PM- Quarter Pounder with cheese, fries, and coke cola from [employer] (satisfied); 6-8 PM eats out- grilled chicken on salad. He travels frequently for work.  Richard Beltran's habits were reviewed today and are as follows: His family eats meals together, he thinks his family will eat healthier with him, his desired weight loss is 25 lbs, he has been heavy most of his life, his heaviest weight ever was 290 pounds, he is a picky eater and doesn't like to eat healthier foods, he snacks frequently in the evenings, he skips meals frequently, he frequently makes poor food choices, and he struggles with emotional eating.  Depression Screen Richard Beltran's Food and Mood (modified PHQ-9) score was 6.  Depression screen [facility] 2/9 10/02/2020  Decreased Interest 3  Down, Depressed, Hopeless 0  PHQ - 2 Score 3  Altered sleeping 1  Tired, decreased energy 2  Change in appetite 0  Feeling  bad or failure about yourself  0  Trouble concentrating 0  Moving slowly or fidgety/restless 0  Suicidal thoughts 0  PHQ-9 Score 6  Difficult doing work/chores Somewhat difficult  Some recent data might be hidden   Subjective:   1. Other fatigue Richard Beltran admits to daytime somnolence and admits to waking up still tired. Patent has a history of symptoms of daytime fatigue and morning fatigue. Richard Beltran generally gets 6 hours of sleep per night, and states that he has generally restful sleep. Snoring is present. Apneic episodes are not present. Epworth Sleepiness Score is 6. EKG normal sinus rhythm at 60 bpm.  2. Shortness of breath on exertion Richard Beltran notes increasing shortness of breath with exercising and seems to be worsening over time with weight gain. He notes getting out of breath sooner with activity than he used to. This has gotten worse recently. Richard Beltran denies shortness of breath at rest or orthopnea. EKG normal sinus rhythm at 60 bpm.  3. Hypertension, essential, benign Diagnosed about 10 years ago. Richard Beltran is on amlodipine, lisinopril HCTZ, and carvedilol. His BP is well controlled today. Pt denies chest pain/chest pressure/headache.  4. Mixed hyperlipidemia Richard Beltran's last LDL was 151, HDL 37, and triglycerides 167. He is not on statin therapy.  5. Obstructive sleep apnea History of OSA. Richard Beltran previously wore a CPAP but no longer wearing one.  6. At risk for heart disease Richard Beltran is at a higher than average risk for cardiovascular disease due to obesity.   Assessment/Plan:   1. Other fatigue Richard Beltran does feel that his weight is  causing his energy to be lower than it should be. Fatigue may be related to obesity, depression or many other causes. Labs will be ordered, and in the meanwhile, Richard Beltran will focus on self care including making healthy food choices, increasing physical activity and focusing on stress reduction.  - Vitamin B12 - EKG 12-Lead - Folate -  Hemoglobin A1c - Insulin, random - T3 - T4 - TSH - VITAMIN D 25 Hydroxy (Vit-D Deficiency, Fractures)  2. Shortness of breath on exertion Richard Beltran does feel that he gets out of breath more easily that he used to when he exercises. Richard Beltran's shortness of breath appears to be obesity related and exercise induced. He has agreed to work on weight loss and gradually increase exercise to treat his exercise induced shortness of breath. Will continue to monitor closely.  - CBC with Differential/Platelet  3. Hypertension, essential, benign Richard Beltran will continue to work on weight loss, exercise, and decreasing simple carbohydrates to help decrease the risk of diabetes. Richard Beltran agreed to follow-up with [place] as directed to closely monitor his progress.  - Comprehensive metabolic panel  4. Mixed hyperlipidemia Cardiovascular risk and specific lipid/LDL goals reviewed.  We discussed several lifestyle modifications today and Richard Beltran will continue to work on diet, exercise and weight loss efforts. Orders and follow up as documented in patient record.   Counseling Intensive lifestyle modifications are the first line treatment for this issue. Dietary changes: Increase soluble fiber. Decrease simple carbohydrates. Exercise changes: Moderate to vigorous-intensity aerobic activity 150 minutes per week if tolerated. Lipid-lowering medications: see documented in medical record.  - Lipid Panel With LDL/HDL Ratio  5. Obstructive sleep apnea Intensive lifestyle modifications are the first line treatment for this issue. We discussed several lifestyle modifications today and he will continue to work on diet, exercise and weight loss efforts. We will continue to monitor. Orders and follow up as documented in patient record.  Follow up at subsequent appts.  6. Screening for depression Richard Beltran had a positive depression screening. Depression is commonly associated with obesity and often results in emotional  eating behaviors. We will monitor this closely and work on CBT to help improve the non-hunger eating patterns. Referral to Psychology may be required if no improvement is seen as he continues in our clinic.  7. At risk for heart disease Richard Beltran was given approximately 15 minutes of coronary artery disease prevention counseling today. He is 55 y.o. male and has risk factors for heart disease including obesity. We discussed intensive lifestyle modifications today with an emphasis on specific weight loss instructions and strategies.   Repetitive spaced learning was employed today to elicit superior memory formation and behavioral change.  8. Class 2 severe obesity with serious comorbidity and body mass index (BMI) of 37.0 to 37.9 in adult, unspecified obesity type [facility])  Richard Beltran is currently in the action stage of change and his goal is to continue with weight loss efforts. I recommend Richard Beltran begin the structured treatment plan as follows:  He has agreed to the Category 4 Plan.  Exercise goals: No exercise has been prescribed at this time.   Behavioral modification strategies: increasing lean protein intake, meal planning and cooking strategies, keeping healthy foods in the home, and planning for success.  He was informed of the importance of frequent follow-up visits to maximize his success with intensive lifestyle modifications for his multiple health conditions. He was informed we would discuss his lab results at his next visit unless there is a critical issue that needs to  be addressed sooner. Richard Beltran agreed to keep his next visit at the agreed upon time to discuss these results.  Objective:   Blood pressure 121/69, pulse (!) 59, temperature 98.1 F (36.7 C), height 6' (1.829 m), weight 275 lb (124.7 kg), SpO2 95 %. Body mass index is 37.3 kg/m.  EKG: Normal sinus rhythm, rate 60.  Indirect Calorimeter completed today shows a VO2 of 346 and a REE of 2405.  His calculated basal  metabolic rate is 7425 thus his basal metabolic rate is worse than expected.  General: Cooperative, alert, well developed, in no acute distress. HEENT: Conjunctivae and lids unremarkable. Cardiovascular: Regular rhythm.  Lungs: Normal work of breathing. Neurologic: No focal deficits.   Lab Results  Component Value Date   CREATININE 1.28 (H) 10/02/2020   BUN 19 10/02/2020   NA 141 10/02/2020   K 4.2 10/02/2020   CL 101 10/02/2020   CO2 25 10/02/2020   Lab Results  Component Value Date   ALT 22 10/02/2020   AST 19 10/02/2020   ALKPHOS 60 10/02/2020   BILITOT 0.6 10/02/2020   Lab Results  Component Value Date   HGBA1C 5.1 10/02/2020   HGBA1C 4.6 08/30/2019   HGBA1C 5.2 12/19/2017   HGBA1C 5.1 12/08/2016   HGBA1C 5.5 11/05/2013   Lab Results  Component Value Date   INSULIN 22.7 10/02/2020   Lab Results  Component Value Date   TSH 1.500 10/02/2020   Lab Results  Component Value Date   CHOL 209 (H) 10/02/2020   HDL 38 (L) 10/02/2020   LDLCALC 145 (H) 10/02/2020   TRIG 143 10/02/2020   CHOLHDL 5.9 (H) 08/30/2019   Lab Results  Component Value Date   WBC 6.1 10/02/2020   HGB 14.6 10/02/2020   HCT 45.6 10/02/2020   MCV 92 10/02/2020   PLT 226 10/02/2020   No results found for: IRON, TIBC, FERRITIN  Attestation Statements:   Reviewed by clinician on day of visit: allergies, medications, problem list, medical history, surgical history, family history, social history, and previous encounter notes.  Coral Ceo, CMA, am acting as transcriptionist for Coralie Common, MD.  This is the patient's first visit at [facility]. The patient's NEW PATIENT PACKET was reviewed at length. Included in the packet: current and past health history, medications, allergies, ROS, gynecologic history (women only), surgical history, family history, social history, weight history, weight loss surgery history (for those that have had weight loss surgery),  nutritional evaluation, mood and food questionnaire, PHQ9, Epworth questionnaire, sleep habits questionnaire, patient life and health improvement goals questionnaire. These will all be scanned into the patient's chart under media.   During the visit, I independently reviewed the patient's EKG, bioimpedance scale results, and indirect calorimeter results. I used this information to tailor a meal plan for the patient that will help him to lose weight and will improve his obesity-related conditions going forward. I performed a medically necessary appropriate examination and/or evaluation. I discussed the assessment and treatment plan with the patient. The patient was provided an opportunity to ask questions and all were answered. The patient agreed with the plan and demonstrated an understanding of the instructions. Labs were ordered at this visit and will be reviewed at the next visit unless more critical results need to be addressed immediately. Clinical information was updated and documented in the EMR.   Time spent on visit including pre-visit chart review and post-visit care was 45 minutes.   A separate 15 minutes was spent  on risk counseling (see above).   I have reviewed the above documentation for accuracy and completeness, and I agree with the above. - Jinny Blossom, MD

## 2020-10-13 NOTE — ED Triage Notes (Signed)
LT knee pain, denies injury x 2 weeks, getting worse

## 2020-10-13 NOTE — ED Provider Notes (Signed)
[place]   [id number] 10/13/20 Arrival Time: 0943  IO:NGEXB PAIN  SUBJECTIVE: History from: patient. Richard Beltran is a 55 y.o. male complains of left knee pain that began 2 weeks ago. Reports that he got out of bed one morning, the left knee "gave out" and he fell in the floor.  Localizes the pain to the medial aspect of the anterior left knee. Describes the pain as constant and achy in character with intermittent sharp pain with activity. Has tried OTC medications without relief. Symptoms are made worse with activity.  Denies similar symptoms in the past. Denies fever, chills, erythema, ecchymosis, weakness, numbness and tingling, saddle paresthesias, loss of bowel or bladder function.      ROS: As per HPI.  All other pertinent ROS negative.     Past Medical History:  Diagnosis Date   Back pain    Hyperlipidemia    Hypertension    Hypertension    Joint pain    Lumbar degenerative disc disease 10/29/2013   Post L4-L5 fusion.    Male hypogonadism    Other fatigue    Shortness of breath on exertion    Past Surgical History:  Procedure Laterality Date   BACK SURGERY  2005   Allergies  Allergen Reactions   Eggs Or Egg-Derived Products Nausea And Vomiting   No current facility-administered medications on file prior to encounter.   Current Outpatient Medications on File Prior to Encounter  Medication Sig Dispense Refill   amLODipine (NORVASC) 10 MG tablet Take 1 tablet (10 mg total) by mouth daily. 90 tablet 3   carvedilol (COREG) 25 MG tablet Take 1 tablet (25 mg total) by mouth 2 (two) times daily with a meal. 180 tablet 1   lisinopril-hydrochlorothiazide (ZESTORETIC) 20-25 MG tablet TAKE 1 TABLET BY MOUTH EVERY DAY 90 tablet 1   meloxicam (MOBIC) 15 MG tablet Take 1 tablet (15 mg total) by mouth daily. 90 tablet 1   testosterone cypionate (DEPOTESTOTERONE CYPIONATE) 100 MG/ML injection Inject 200 mg into the muscle every 14 (fourteen) days. Inject 80mL (200mg   total) into the muscle once every 14 days.     traMADol (ULTRAM) 50 MG tablet Take 1-2 tablets (50-100 mg total) by mouth 3 (three) times daily as needed (pain). 180 tablet 0   Social History   Socioeconomic History   Marital status: Married    Spouse name: [name]   Number of children: 4   Years of education: 18+   Highest education level: Not on file  Occupational History   Occupation: Database administrator  Tobacco Use   Smoking status: Never   Smokeless tobacco: Never  Vaping Use   Vaping Use: Never used  Substance and Sexual Activity   Alcohol use: No   Drug use: No   Sexual activity: Yes    Partners: Female    Birth control/protection: None  Other Topics Concern   Not on file  Social History Narrative   Fun: Golf and read   Denies religious beliefs effecting healthcare.    Social Determinants of Health   Financial Resource Strain: Not on file  Food Insecurity: Not on file  Transportation Needs: Not on file  Physical Activity: Not on file  Stress: Not on file  Social Connections: Not on file  Intimate Partner Violence: Not on file   Family History  Problem Relation Age of Onset   Diabetes Mother    Hyperlipidemia Mother    Hypertension Mother    Healthy Father  Hypertension Father    Healthy Maternal Grandmother    Healthy Maternal Grandfather    Healthy Paternal Grandmother    Healthy Paternal Grandfather    Colon cancer Neg Hx    Colon polyps Neg Hx    Esophageal cancer Neg Hx    Rectal cancer Neg Hx    Stomach cancer Neg Hx     OBJECTIVE:  Vitals:   10/13/20 1003 10/13/20 1005  BP: 132/84   Pulse: 71   Resp: 20   Temp: 98.7 F (37.1 C)   TempSrc: Oral   SpO2: 96%   Weight:  275 lb (124.7 kg)  Height:  6' (1.829 m)    General appearance: ALERT; in no acute distress.  Head: NCAT Lungs: Normal respiratory effort CV: pulses 2+ bilaterally. Cap refill < 2 seconds Musculoskeletal:  Inspection: Skin warm, dry, clear and intact No erythema  noted Effusion to anterior medial left knee Palpation: Anterior medial left knee tender to palpation ROM: Limited ROM active and passive to left knee Skin: warm and dry Neurologic: Ambulates without difficulty; Sensation intact about the upper/ lower extremities Psychological: alert and cooperative; normal mood and affect  DIAGNOSTIC STUDIES:  DG Knee Complete 4 Views Left  Result Date: 10/13/2020 CLINICAL DATA:  Pain EXAM: LEFT KNEE - COMPLETE 4+ VIEW COMPARISON:  None. FINDINGS: Frontal, lateral, and bilateral oblique views were obtained. No fracture or dislocation. There is a fairly small joint effusion. There is narrowing medially and in the patellofemoral joint regions. There is spurring medially and in the patellofemoral joint regions. No erosions. IMPRESSION: Fairly small joint effusion. No fracture or dislocation. Osteoarthritic change noted medially and in the patellofemoral joint regions. Electronically Signed   By: Lowella Grip III M.D.   On: 10/13/2020 10:41     ASSESSMENT & PLAN:  1. Acute pain of left knee   2. Effusion of left knee   3. Primary osteoarthritis of left knee     Meds ordered this encounter  Medications   ketorolac (TORADOL) 30 MG/ML injection 30 mg   dexamethasone (DECADRON) injection 10 mg   Toradol 30mg  IM in office today Decadron 10mg  IM in office today Continue home Mobic and Tramadol Ace wrap to the left knee Wear this when up and active Continue conservative management of rest, ice, and gentle stretches Follow up with Dr Dianah Field Return or go to the ER if you have any new or worsening symptoms (fever, chills, chest pain, abdominal pain, changes in bowel or bladder habits, pain radiating into lower legs)   [place] Controlled Substances Registry consulted for this patient. I feel the risk/benefit ratio today is favorable for proceeding with this prescription for a controlled substance. Medication sedation precautions given.  Reviewed  expectations re: course of current medical issues. Questions answered. Outlined signs and symptoms indicating need for more acute intervention. Patient verbalized understanding. After Visit Summary given.        Faustino Congress, NP 10/13/20 1158

## 2020-10-13 NOTE — Discharge Instructions (Signed)
You have received toradol for pain and decadron for inflammation in the office today  Xray negative for fracture or misalignment  Follow up with Dr Darene Lamer

## 2020-10-14 ENCOUNTER — Ambulatory Visit (INDEPENDENT_AMBULATORY_CARE_PROVIDER_SITE_OTHER): Payer: 59

## 2020-10-14 ENCOUNTER — Ambulatory Visit (INDEPENDENT_AMBULATORY_CARE_PROVIDER_SITE_OTHER): Payer: 59 | Admitting: Sports Medicine

## 2020-10-14 DIAGNOSIS — M1712 Unilateral primary osteoarthritis, left knee: Secondary | ICD-10-CM | POA: Insufficient documentation

## 2020-10-14 DIAGNOSIS — M23204 Derangement of unspecified medial meniscus due to old tear or injury, left knee: Secondary | ICD-10-CM

## 2020-10-14 DIAGNOSIS — M23209 Derangement of unspecified meniscus due to old tear or injury, unspecified knee: Secondary | ICD-10-CM | POA: Insufficient documentation

## 2020-10-14 NOTE — Assessment & Plan Note (Signed)
This is a pleasant 55 year old male, he several months has had increasing pain in his left knee, medial joint line, no mechanical symptoms, he was seen in urgent care, x-rays showed arthritis. He is referred to me, on exam he has a mild effusion, tenderness at the medial joint line and a positive McMurray's sign. He also has some swelling in his lower leg but a negative Homans' sign. Today we did an aspiration and injection, adding formal physical therapy. Return to see me in 6 weeks, MRI for surgical intervention if not better.

## 2020-10-14 NOTE — Progress Notes (Addendum)
    Procedures performed today:    Procedure: Real-time Ultrasound Guided aspiration/injection of left knee Device: Samsung HS60  Verbal informed consent obtained.  Time-out conducted.  Noted no overlying erythema, induration, or other signs of local infection.  Skin prepped in a sterile fashion.  Local anesthesia: Topical Ethyl chloride.  With sterile technique and under real time ultrasound guidance: Noted effusion, using an 18-gauge needle I drained approximately 44 mL of clear, straw-colored fluid, syringe switched and 1 cc Kenalog 40, 2 cc lidocaine, 2 cc bupivacaine injected easily Completed without difficulty  Advised to call if fevers/chills, erythema, induration, drainage, or persistent bleeding.  Images permanently stored and available for review in PACS.  Impression: Technically successful ultrasound guided injection.  Independent interpretation of notes and tests performed by another provider:   None.  Brief History, Exam, Impression, and Recommendations:    Chronic meniscal tear of left knee This is a pleasant 55 year old male, he several months has had increasing pain in his left knee, medial joint line, no mechanical symptoms, he was seen in urgent care, x-rays showed arthritis. He is referred to me, on exam he has a mild effusion, tenderness at the medial joint line and a positive McMurray's sign. He also has some swelling in his lower leg but a negative Homans' sign. Today we did an aspiration and injection, adding formal physical therapy. Return to see me in 6 weeks, MRI for surgical intervention if not better.    ___________________________________________ Gwen Her. Dianah Field, M.D., ABFM., CAQSM. Primary Care and [place] Instructor of [place] [facility]

## 2020-10-16 ENCOUNTER — Ambulatory Visit (INDEPENDENT_AMBULATORY_CARE_PROVIDER_SITE_OTHER): Payer: Self-pay | Admitting: Family Medicine

## 2020-10-21 ENCOUNTER — Ambulatory Visit (INDEPENDENT_AMBULATORY_CARE_PROVIDER_SITE_OTHER): Payer: 59 | Admitting: Sports Medicine

## 2020-10-21 ENCOUNTER — Other Ambulatory Visit: Payer: Self-pay

## 2020-10-21 VITALS — BP 143/76 | HR 70 | Resp 20 | Ht 72.0 in | Wt 275.0 lb

## 2020-10-21 DIAGNOSIS — E291 Testicular hypofunction: Secondary | ICD-10-CM

## 2020-10-21 MED ORDER — TESTOSTERONE CYPIONATE 200 MG/ML IM SOLN
200.0000 mg | Freq: Once | INTRAMUSCULAR | Status: AC
  Administered 2020-10-21: 200 mg via INTRAMUSCULAR

## 2020-10-21 NOTE — Progress Notes (Signed)
Established Patient Office Visit  Subjective:  Patient ID: Richard Beltran, male    DOB: 07-12-1965  Age: 55 y.o. MRN: [id number]  CC:  Chief Complaint  Patient presents with   Male hypogonadism    HPI Richard Beltran presents for a testosterone injection. Denies chest pain, shortness of breath, headaches, and problems with medication or mood changes.    Patient tolerated testosterone 200 mg injection to RUOQ well without complications. Patient advised to schedule next injection in 14 days.    Past Medical History:  Diagnosis Date   Back pain    Hyperlipidemia    Hypertension    Hypertension    Joint pain    Lumbar degenerative disc disease 10/29/2013   Post L4-L5 fusion.    Male hypogonadism    Other fatigue    Shortness of breath on exertion     Past Surgical History:  Procedure Laterality Date   BACK SURGERY  2005    Family History  Problem Relation Age of Onset   Diabetes Mother    Hyperlipidemia Mother    Hypertension Mother    Healthy Father    Hypertension Father    Healthy Maternal Grandmother    Healthy Maternal Grandfather    Healthy Paternal Grandmother    Healthy Paternal Grandfather    Colon cancer Neg Hx    Colon polyps Neg Hx    Esophageal cancer Neg Hx    Rectal cancer Neg Hx    Stomach cancer Neg Hx     Social History   Socioeconomic History   Marital status: Married    Spouse name: [name]   Number of children: 4   Years of education: 18+   Highest education level: Not on file  Occupational History   Occupation: Database administrator  Tobacco Use   Smoking status: Never   Smokeless tobacco: Never  Vaping Use   Vaping Use: Never used  Substance and Sexual Activity   Alcohol use: No   Drug use: No   Sexual activity: Yes    Partners: Female    Birth control/protection: None  Other Topics Concern   Not on file  Social History Narrative   Fun: Golf and read   Denies religious beliefs effecting healthcare.    Social Determinants of  Health   Financial Resource Strain: Not on file  Food Insecurity: Not on file  Transportation Needs: Not on file  Physical Activity: Not on file  Stress: Not on file  Social Connections: Not on file  Intimate Partner Violence: Not on file    Outpatient Medications Prior to Visit  Medication Sig Dispense Refill   amLODipine (NORVASC) 10 MG tablet Take 1 tablet (10 mg total) by mouth daily. 90 tablet 3   carvedilol (COREG) 25 MG tablet Take 1 tablet (25 mg total) by mouth 2 (two) times daily with a meal. 180 tablet 1   lisinopril-hydrochlorothiazide (ZESTORETIC) 20-25 MG tablet TAKE 1 TABLET BY MOUTH EVERY DAY 90 tablet 1   meloxicam (MOBIC) 15 MG tablet Take 1 tablet (15 mg total) by mouth daily. 90 tablet 1   testosterone cypionate (DEPOTESTOTERONE CYPIONATE) 100 MG/ML injection Inject 200 mg into the muscle every 14 (fourteen) days. Inject 21m (2026mtotal) into the muscle once every 14 days.     traMADol (ULTRAM) 50 MG tablet Take 1-2 tablets (50-100 mg total) by mouth 3 (three) times daily as needed (pain). 180 tablet 0   No facility-administered medications prior to visit.    Allergies  Allergen Reactions   Eggs Or Egg-Derived Products Nausea And Vomiting    ROS Review of Systems    Objective:    Physical Exam  BP (!) 143/76 (BP Location: Left Arm, Patient Position: Sitting, Cuff Size: Large)   Pulse 70   Resp 20   Ht 6' (1.829 m)   Wt 275 lb (124.7 kg)   SpO2 99%   BMI 37.30 kg/m  Wt Readings from Last 3 Encounters:  10/21/20 275 lb (124.7 kg)  10/13/20 275 lb (124.7 kg)  10/02/20 275 lb (124.7 kg)     Health Maintenance Due  Topic Date Due   Zoster Vaccines- Shingrix (1 of 2) Never done   COVID-19 Vaccine (3 - Booster for Pfizer series) 01/14/2020    There are no preventive care reminders to display for this patient.  Lab Results  Component Value Date   TSH 1.500 10/02/2020   Lab Results  Component Value Date   WBC 6.1 10/02/2020   HGB 14.6  10/02/2020   HCT 45.6 10/02/2020   MCV 92 10/02/2020   PLT 226 10/02/2020   Lab Results  Component Value Date   NA 141 10/02/2020   K 4.2 10/02/2020   CO2 25 10/02/2020   GLUCOSE 81 10/02/2020   BUN 19 10/02/2020   CREATININE 1.28 (H) 10/02/2020   BILITOT 0.6 10/02/2020   ALKPHOS 60 10/02/2020   AST 19 10/02/2020   ALT 22 10/02/2020   PROT 7.3 10/02/2020   ALBUMIN 4.2 10/02/2020   CALCIUM 9.8 10/02/2020   EGFR 67 10/02/2020   Lab Results  Component Value Date   CHOL 209 (H) 10/02/2020   Lab Results  Component Value Date   HDL 38 (L) 10/02/2020   Lab Results  Component Value Date   LDLCALC 145 (H) 10/02/2020   Lab Results  Component Value Date   TRIG 143 10/02/2020   Lab Results  Component Value Date   CHOLHDL 5.9 (H) 08/30/2019   Lab Results  Component Value Date   HGBA1C 5.1 10/02/2020      Assessment & Plan:  Patient tolerated testosterone 200 mg injection to RUOQ well without complications. Patient advised to schedule next injection in 14 days.   Problem List Items Addressed This Visit       Endocrine   Male hypogonadism - Primary    Meds ordered this encounter  Medications   testosterone cypionate (DEPOTESTOSTERONE CYPIONATE) injection 200 mg    Follow-up: Return in about 2 weeks (around 11/04/2020) for Testosterone Injection.    Ninfa Meeker, CMA

## 2020-10-24 ENCOUNTER — Ambulatory Visit: Payer: 59 | Admitting: Physical Therapy

## 2020-11-04 ENCOUNTER — Ambulatory Visit: Payer: 59

## 2020-11-07 ENCOUNTER — Ambulatory Visit: Payer: 59

## 2020-11-07 ENCOUNTER — Other Ambulatory Visit: Payer: Self-pay

## 2020-11-07 ENCOUNTER — Ambulatory Visit (INDEPENDENT_AMBULATORY_CARE_PROVIDER_SITE_OTHER): Payer: 59 | Admitting: Sports Medicine

## 2020-11-07 VITALS — BP 141/81 | HR 83

## 2020-11-07 DIAGNOSIS — E291 Testicular hypofunction: Secondary | ICD-10-CM

## 2020-11-07 MED ORDER — TESTOSTERONE CYPIONATE 200 MG/ML IM SOLN
200.0000 mg | Freq: Once | INTRAMUSCULAR | Status: AC
  Administered 2020-11-07: 200 mg via INTRAMUSCULAR

## 2020-11-07 NOTE — Progress Notes (Signed)
Established Patient Office Visit  Subjective:  Patient ID: Richard Beltran, male    DOB: [birth year]  Age: 55 y.o. MRN: [id number]  CC:  Chief Complaint  Patient presents with   Hypogonadism    HPI Richard Beltran is here for a testosterone injection. Denies chest pain, shortness of breath, headaches or mood changes.    Past Medical History:  Diagnosis Date   Back pain    Hyperlipidemia    Hypertension    Hypertension    Joint pain    Lumbar degenerative disc disease 10/29/2013   Post L4-L5 fusion.    Male hypogonadism    Other fatigue    Shortness of breath on exertion     Past Surgical History:  Procedure Laterality Date   BACK SURGERY  2005    Family History  Problem Relation Age of Onset   Diabetes Mother    Hyperlipidemia Mother    Hypertension Mother    Healthy Father    Hypertension Father    Healthy Maternal Grandmother    Healthy Maternal Grandfather    Healthy Paternal Grandmother    Healthy Paternal Grandfather    Colon cancer Neg Hx    Colon polyps Neg Hx    Esophageal cancer Neg Hx    Rectal cancer Neg Hx    Stomach cancer Neg Hx     Social History   Socioeconomic History   Marital status: Married    Spouse name: [name]   Number of children: 4   Years of education: 18+   Highest education level: Not on file  Occupational History   Occupation: Database administrator  Tobacco Use   Smoking status: Never   Smokeless tobacco: Never  Vaping Use   Vaping Use: Never used  Substance and Sexual Activity   Alcohol use: No   Drug use: No   Sexual activity: Yes    Partners: Female    Birth control/protection: None  Other Topics Concern   Not on file  Social History Narrative   Fun: Golf and read   Denies religious beliefs effecting healthcare.    Social Determinants of Health   Financial Resource Strain: Not on file  Food Insecurity: Not on file  Transportation Needs: Not on file  Physical Activity: Not on file  Stress: Not on file  Social  Connections: Not on file  Intimate Partner Violence: Not on file    Outpatient Medications Prior to Visit  Medication Sig Dispense Refill   amLODipine (NORVASC) 10 MG tablet Take 1 tablet (10 mg total) by mouth daily. 90 tablet 3   carvedilol (COREG) 25 MG tablet Take 1 tablet (25 mg total) by mouth 2 (two) times daily with a meal. 180 tablet 1   lisinopril-hydrochlorothiazide (ZESTORETIC) 20-25 MG tablet TAKE 1 TABLET BY MOUTH EVERY DAY 90 tablet 1   meloxicam (MOBIC) 15 MG tablet Take 1 tablet (15 mg total) by mouth daily. 90 tablet 1   testosterone cypionate (DEPOTESTOTERONE CYPIONATE) 100 MG/ML injection Inject 200 mg into the muscle every 14 (fourteen) days. Inject 52m (2056mtotal) into the muscle once every 14 days.     traMADol (ULTRAM) 50 MG tablet Take 1-2 tablets (50-100 mg total) by mouth 3 (three) times daily as needed (pain). 180 tablet 0   No facility-administered medications prior to visit.    Allergies  Allergen Reactions   Eggs Or Egg-Derived Products Nausea And Vomiting    ROS Review of Systems    Objective:    Physical  Exam  BP (!) 141/81   Pulse 83   SpO2 98%  Wt Readings from Last 3 Encounters:  10/21/20 275 lb (124.7 kg)  10/13/20 275 lb (124.7 kg)  10/02/20 275 lb (124.7 kg)     Health Maintenance Due  Topic Date Due   Zoster Vaccines- Shingrix (1 of 2) Never done   COVID-19 Vaccine (3 - Booster for Pfizer series) 01/14/2020    There are no preventive care reminders to display for this patient.  Lab Results  Component Value Date   TSH 1.500 10/02/2020   Lab Results  Component Value Date   WBC 6.1 10/02/2020   HGB 14.6 10/02/2020   HCT 45.6 10/02/2020   MCV 92 10/02/2020   PLT 226 10/02/2020   Lab Results  Component Value Date   NA 141 10/02/2020   K 4.2 10/02/2020   CO2 25 10/02/2020   GLUCOSE 81 10/02/2020   BUN 19 10/02/2020   CREATININE 1.28 (H) 10/02/2020   BILITOT 0.6 10/02/2020   ALKPHOS 60 10/02/2020   AST 19  10/02/2020   ALT 22 10/02/2020   PROT 7.3 10/02/2020   ALBUMIN 4.2 10/02/2020   CALCIUM 9.8 10/02/2020   EGFR 67 10/02/2020   Lab Results  Component Value Date   CHOL 209 (H) 10/02/2020   Lab Results  Component Value Date   HDL 38 (L) 10/02/2020   Lab Results  Component Value Date   LDLCALC 145 (H) 10/02/2020   Lab Results  Component Value Date   TRIG 143 10/02/2020   Lab Results  Component Value Date   CHOLHDL 5.9 (H) 08/30/2019   Lab Results  Component Value Date   HGBA1C 5.1 10/02/2020      Assessment & Plan:  Hypergonadism - Patient tolerated injection well without complications. Patient advised to schedule next injection 14 days from today.    Problem List Items Addressed This Visit     Male hypogonadism - Primary    Meds ordered this encounter  Medications   testosterone cypionate (DEPOTESTOSTERONE CYPIONATE) injection 200 mg    Follow-up: Return in about 2 weeks (around 11/21/2020) for testosterone injection. Durene Romans, Monico Blitz, [place]

## 2020-11-10 ENCOUNTER — Telehealth: Payer: Self-pay | Admitting: Sports Medicine

## 2020-11-10 ENCOUNTER — Other Ambulatory Visit: Payer: Self-pay

## 2020-11-10 ENCOUNTER — Encounter: Payer: Self-pay | Admitting: Sports Medicine

## 2020-11-10 ENCOUNTER — Ambulatory Visit (INDEPENDENT_AMBULATORY_CARE_PROVIDER_SITE_OTHER): Payer: 59 | Admitting: Sports Medicine

## 2020-11-10 DIAGNOSIS — M23204 Derangement of unspecified medial meniscus due to old tear or injury, left knee: Secondary | ICD-10-CM | POA: Diagnosis not present

## 2020-11-10 MED ORDER — HYDROCODONE-ACETAMINOPHEN 5-325 MG PO TABS: 1.0000 | ORAL_TABLET | Freq: Three times a day (TID) | ORAL | 0 refills | Status: DC | PRN

## 2020-11-10 NOTE — Progress Notes (Signed)
    Procedures performed today:    None.  Independent interpretation of notes and tests performed by another provider:   None.  Brief History, Exam, Impression, and Recommendations:    Chronic meniscal tear of left knee This is a pleasant 55 year old male, he has a several month history of increasing pain in the left knee, medial joint line, x-rays have historically showed osteoarthritis, he had a positive McMurray's sign at the last visit we did an aspiration and injection, he noted good initial improvement from the injection with a recurrence of pain. We do need an MRI at this juncture, if he has significant meniscal tearing and minimal osteoarthritis he will need an arthroscopy, if he has significant osteoarthritis plus or minus meniscal tearing we will proceed with viscosupplementation, I am going to go ahead and get him approved. Return to see me to go over MRI results, tramadol ineffective, switching to hydrocodone. He understands not to use tramadol and hydrocodone together.    ___________________________________________ Gwen Her. Dianah Field, M.D., ABFM., CAQSM. Primary Care and [place] Instructor of [place] of [facility] of Medicine

## 2020-11-10 NOTE — Telephone Encounter (Signed)
Please work on [employer], left knee, x-ray confirmed osteoarthritis.  Failed steroid injections and oral medications.

## 2020-11-10 NOTE — Assessment & Plan Note (Addendum)
This is a pleasant 55 year old male, he has a several month history of increasing pain in the left knee, medial joint line, x-rays have historically showed osteoarthritis, he had a positive McMurray's sign at the last visit we did an aspiration and injection, he noted good initial improvement from the injection with a recurrence of pain. We do need an MRI at this juncture, if he has significant meniscal tearing and minimal osteoarthritis he will need an arthroscopy, if he has significant osteoarthritis plus or minus meniscal tearing we will proceed with viscosupplementation, I am going to go ahead and get him approved. Return to see me to go over MRI results, tramadol ineffective, switching to hydrocodone. He understands not to use tramadol and hydrocodone together.

## 2020-11-11 ENCOUNTER — Ambulatory Visit: Payer: 59 | Admitting: Sports Medicine

## 2020-11-12 NOTE — Telephone Encounter (Signed)
Request submitted through [employer].  Waiting for BID and prior authorization.

## 2020-11-21 ENCOUNTER — Ambulatory Visit (INDEPENDENT_AMBULATORY_CARE_PROVIDER_SITE_OTHER): Payer: 59 | Admitting: Sports Medicine

## 2020-11-21 ENCOUNTER — Other Ambulatory Visit: Payer: Self-pay

## 2020-11-21 ENCOUNTER — Other Ambulatory Visit: Payer: Self-pay | Admitting: Sports Medicine

## 2020-11-21 VITALS — BP 147/82 | HR 69 | Resp 20 | Wt 275.0 lb

## 2020-11-21 DIAGNOSIS — E291 Testicular hypofunction: Secondary | ICD-10-CM | POA: Diagnosis not present

## 2020-11-21 DIAGNOSIS — M5136 Other intervertebral disc degeneration, lumbar region: Secondary | ICD-10-CM

## 2020-11-21 DIAGNOSIS — M51369 Other intervertebral disc degeneration, lumbar region without mention of lumbar back pain or lower extremity pain: Secondary | ICD-10-CM

## 2020-11-21 MED ORDER — MELOXICAM 15 MG PO TABS: 15.0000 mg | ORAL_TABLET | Freq: Every day | ORAL | 1 refills | Status: DC

## 2020-11-21 MED ORDER — TRAMADOL HCL 50 MG PO TABS: 50.0000 mg | ORAL_TABLET | Freq: Three times a day (TID) | ORAL | 0 refills | Status: DC | PRN

## 2020-11-21 MED ORDER — TESTOSTERONE CYPIONATE 200 MG/ML IM SOLN
200.0000 mg | Freq: Once | INTRAMUSCULAR | Status: AC
  Administered 2020-11-21: 200 mg via INTRAMUSCULAR

## 2020-11-21 NOTE — Progress Notes (Signed)
Established Patient Office Visit  Subjective:  Patient ID: Richard Beltran, male    DOB: 1966/01/06  Age: 55 y.o. MRN: [id number]  CC:  Chief Complaint  Patient presents with   Hypogonadism    HPI Richard Beltran presents for a testosterone injection. Denies chest pain, shortness of breath, headaches, and problems with medication or mood changes.    Patient tolerated testosterone 200 mg injection to RUOQ well without complications. Patient advised to schedule next injection in 14 days.    Pt also requests refill on Meloxicam and Tramadol, refills pended.  Past Medical History:  Diagnosis Date   Back pain    Hyperlipidemia    Hypertension    Hypertension    Joint pain    Lumbar degenerative disc disease 10/29/2013   Post L4-L5 fusion.    Male hypogonadism    Other fatigue    Shortness of breath on exertion     Past Surgical History:  Procedure Laterality Date   BACK SURGERY  2005    Family History  Problem Relation Age of Onset   Diabetes Mother    Hyperlipidemia Mother    Hypertension Mother    Healthy Father    Hypertension Father    Healthy Maternal Grandmother    Healthy Maternal Grandfather    Healthy Paternal Grandmother    Healthy Paternal Grandfather    Colon cancer Neg Hx    Colon polyps Neg Hx    Esophageal cancer Neg Hx    Rectal cancer Neg Hx    Stomach cancer Neg Hx     Social History   Socioeconomic History   Marital status: Married    Spouse name: [name]   Number of children: 4   Years of education: 18+   Highest education level: Not on file  Occupational History   Occupation: Database administrator  Tobacco Use   Smoking status: Never   Smokeless tobacco: Never  Vaping Use   Vaping Use: Never used  Substance and Sexual Activity   Alcohol use: No   Drug use: No   Sexual activity: Yes    Partners: Female    Birth control/protection: None  Other Topics Concern   Not on file  Social History Narrative   Fun: Golf and read   Denies  religious beliefs effecting healthcare.    Social Determinants of Health   Financial Resource Strain: Not on file  Food Insecurity: Not on file  Transportation Needs: Not on file  Physical Activity: Not on file  Stress: Not on file  Social Connections: Not on file  Intimate Partner Violence: Not on file    Outpatient Medications Prior to Visit  Medication Sig Dispense Refill   amLODipine (NORVASC) 10 MG tablet Take 1 tablet (10 mg total) by mouth daily. 90 tablet 3   carvedilol (COREG) 25 MG tablet Take 1 tablet (25 mg total) by mouth 2 (two) times daily with a meal. 180 tablet 1   HYDROcodone-acetaminophen (NORCO/VICODIN) 5-325 MG tablet Take 1 tablet by mouth every 8 (eight) hours as needed for moderate pain. 15 tablet 0   lisinopril-hydrochlorothiazide (ZESTORETIC) 20-25 MG tablet TAKE 1 TABLET BY MOUTH EVERY DAY 90 tablet 1   meloxicam (MOBIC) 15 MG tablet Take 1 tablet (15 mg total) by mouth daily. 90 tablet 1   testosterone cypionate (DEPOTESTOTERONE CYPIONATE) 100 MG/ML injection Inject 200 mg into the muscle every 14 (fourteen) days. Inject 9m (2073mtotal) into the muscle once every 14 days.     traMADol (  ULTRAM) 50 MG tablet Take 1-2 tablets (50-100 mg total) by mouth 3 (three) times daily as needed (pain). 180 tablet 0   No facility-administered medications prior to visit.    Allergies  Allergen Reactions   Eggs Or Egg-Derived Products Nausea And Vomiting    ROS Review of Systems    Objective:    Physical Exam  BP (!) 147/82 (BP Location: Left Arm, Patient Position: Sitting, Cuff Size: Large)   Pulse 69   Resp 20   Wt 275 lb (124.7 kg)   SpO2 99%   BMI 37.30 kg/m  Wt Readings from Last 3 Encounters:  11/21/20 275 lb (124.7 kg)  10/21/20 275 lb (124.7 kg)  10/13/20 275 lb (124.7 kg)     Health Maintenance Due  Topic Date Due   Zoster Vaccines- Shingrix (1 of 2) Never done   COVID-19 Vaccine (3 - Booster for Pfizer series) 01/14/2020    There are  no preventive care reminders to display for this patient.  Lab Results  Component Value Date   TSH 1.500 10/02/2020   Lab Results  Component Value Date   WBC 6.1 10/02/2020   HGB 14.6 10/02/2020   HCT 45.6 10/02/2020   MCV 92 10/02/2020   PLT 226 10/02/2020   Lab Results  Component Value Date   NA 141 10/02/2020   K 4.2 10/02/2020   CO2 25 10/02/2020   GLUCOSE 81 10/02/2020   BUN 19 10/02/2020   CREATININE 1.28 (H) 10/02/2020   BILITOT 0.6 10/02/2020   ALKPHOS 60 10/02/2020   AST 19 10/02/2020   ALT 22 10/02/2020   PROT 7.3 10/02/2020   ALBUMIN 4.2 10/02/2020   CALCIUM 9.8 10/02/2020   EGFR 67 10/02/2020   Lab Results  Component Value Date   CHOL 209 (H) 10/02/2020   Lab Results  Component Value Date   HDL 38 (L) 10/02/2020   Lab Results  Component Value Date   LDLCALC 145 (H) 10/02/2020   Lab Results  Component Value Date   TRIG 143 10/02/2020   Lab Results  Component Value Date   CHOLHDL 5.9 (H) 08/30/2019   Lab Results  Component Value Date   HGBA1C 5.1 10/02/2020      Assessment & Plan:  Patient tolerated testosterone 200 mg injection to RUOQ well without complications. Patient advised to schedule next injection in 14 days.   Problem List Items Addressed This Visit       Endocrine   Male hypogonadism - Primary    Meds ordered this encounter  Medications   testosterone cypionate (DEPOTESTOSTERONE CYPIONATE) injection 200 mg    Follow-up: Return in about 2 weeks (around 12/05/2020) for Testosterone injection.    Ninfa Meeker, CMA

## 2020-11-24 ENCOUNTER — Other Ambulatory Visit: Payer: 59

## 2020-11-25 NOTE — Telephone Encounter (Signed)
Submitted PA and clinicals to [facility].  Waiting on determination.

## 2020-12-05 ENCOUNTER — Other Ambulatory Visit: Payer: Self-pay

## 2020-12-05 ENCOUNTER — Ambulatory Visit (INDEPENDENT_AMBULATORY_CARE_PROVIDER_SITE_OTHER): Payer: 59 | Admitting: Physician Assistant

## 2020-12-05 VITALS — BP 133/79 | HR 66

## 2020-12-05 DIAGNOSIS — E291 Testicular hypofunction: Secondary | ICD-10-CM | POA: Diagnosis not present

## 2020-12-05 MED ORDER — TESTOSTERONE CYPIONATE 200 MG/ML IM SOLN
200.0000 mg | Freq: Once | INTRAMUSCULAR | Status: AC
Start: 2020-12-05 — End: 2020-12-05
  Administered 2020-12-05: 200 mg via INTRAMUSCULAR

## 2020-12-05 NOTE — Progress Notes (Signed)
Established Patient Office Visit  Subjective:  Patient ID: Richard Beltran, male    DOB: 04-15-1966  Age: 55 y.o. MRN: [id number]  CC:  Chief Complaint  Patient presents with   male hypogonadism    HPI Richard Beltran presents for testosterone injection. Denies chest pain, shortness of breath, headaches, and problems with medications.   Past Medical History:  Diagnosis Date   Back pain    Hyperlipidemia    Hypertension    Hypertension    Joint pain    Lumbar degenerative disc disease 10/29/2013   Post L4-L5 fusion.    Male hypogonadism    Other fatigue    Shortness of breath on exertion     Past Surgical History:  Procedure Laterality Date   BACK SURGERY  2005    Family History  Problem Relation Age of Onset   Diabetes Mother    Hyperlipidemia Mother    Hypertension Mother    Healthy Father    Hypertension Father    Healthy Maternal Grandmother    Healthy Maternal Grandfather    Healthy Paternal Grandmother    Healthy Paternal Grandfather    Colon cancer Neg Hx    Colon polyps Neg Hx    Esophageal cancer Neg Hx    Rectal cancer Neg Hx    Stomach cancer Neg Hx     Social History   Socioeconomic History   Marital status: Married    Spouse name: [name]   Number of children: 4   Years of education: 18+   Highest education level: Not on file  Occupational History   Occupation: Database administrator  Tobacco Use   Smoking status: Never   Smokeless tobacco: Never  Vaping Use   Vaping Use: Never used  Substance and Sexual Activity   Alcohol use: No   Drug use: No   Sexual activity: Yes    Partners: Female    Birth control/protection: None  Other Topics Concern   Not on file  Social History Narrative   Fun: Golf and read   Denies religious beliefs effecting healthcare.    Social Determinants of Health   Financial Resource Strain: Not on file  Food Insecurity: Not on file  Transportation Needs: Not on file  Physical Activity: Not on file  Stress: Not on  file  Social Connections: Not on file  Intimate Partner Violence: Not on file    Outpatient Medications Prior to Visit  Medication Sig Dispense Refill   amLODipine (NORVASC) 10 MG tablet Take 1 tablet (10 mg total) by mouth daily. 90 tablet 3   carvedilol (COREG) 25 MG tablet Take 1 tablet (25 mg total) by mouth 2 (two) times daily with a meal. 180 tablet 1   lisinopril-hydrochlorothiazide (ZESTORETIC) 20-25 MG tablet TAKE 1 TABLET BY MOUTH EVERY DAY 90 tablet 1   meloxicam (MOBIC) 15 MG tablet Take 1 tablet (15 mg total) by mouth daily. 90 tablet 1   testosterone cypionate (DEPOTESTOTERONE CYPIONATE) 100 MG/ML injection Inject 200 mg into the muscle every 14 (fourteen) days. Inject 25m (2033mtotal) into the muscle once every 14 days.     traMADol (ULTRAM) 50 MG tablet Take 1-2 tablets (50-100 mg total) by mouth 3 (three) times daily as needed (pain). 180 tablet 0   No facility-administered medications prior to visit.    Allergies  Allergen Reactions   Eggs Or Egg-Derived Products Nausea And Vomiting    ROS Review of Systems    Objective:    Physical Exam  BP 133/79 (BP Location: Left Arm, Patient Position: Sitting, Cuff Size: Normal)   Pulse 66   SpO2 99%  Wt Readings from Last 3 Encounters:  11/21/20 275 lb (124.7 kg)  10/21/20 275 lb (124.7 kg)  10/13/20 275 lb (124.7 kg)     Health Maintenance Due  Topic Date Due   Zoster Vaccines- Shingrix (1 of 2) Never done   COVID-19 Vaccine (3 - Booster for Pfizer series) 01/14/2020    There are no preventive care reminders to display for this patient.  Lab Results  Component Value Date   TSH 1.500 10/02/2020   Lab Results  Component Value Date   WBC 6.1 10/02/2020   HGB 14.6 10/02/2020   HCT 45.6 10/02/2020   MCV 92 10/02/2020   PLT 226 10/02/2020   Lab Results  Component Value Date   NA 141 10/02/2020   K 4.2 10/02/2020   CO2 25 10/02/2020   GLUCOSE 81 10/02/2020   BUN 19 10/02/2020   CREATININE 1.28 (H)  10/02/2020   BILITOT 0.6 10/02/2020   ALKPHOS 60 10/02/2020   AST 19 10/02/2020   ALT 22 10/02/2020   PROT 7.3 10/02/2020   ALBUMIN 4.2 10/02/2020   CALCIUM 9.8 10/02/2020   EGFR 67 10/02/2020   Lab Results  Component Value Date   CHOL 209 (H) 10/02/2020   Lab Results  Component Value Date   HDL 38 (L) 10/02/2020   Lab Results  Component Value Date   LDLCALC 145 (H) 10/02/2020   Lab Results  Component Value Date   TRIG 143 10/02/2020   Lab Results  Component Value Date   CHOLHDL 5.9 (H) 08/30/2019   Lab Results  Component Value Date   HGBA1C 5.1 10/02/2020      Assessment & Plan:   Injection was given in the right upper outer quadrant. Patient tolerated injection well without complications.  Problem List Items Addressed This Visit       Endocrine   Male hypogonadism - Primary    Meds ordered this encounter  Medications   testosterone cypionate (DEPOTESTOSTERONE CYPIONATE) injection 200 mg    Follow-up: Return in about 2 weeks (around 12/19/2020) for injection .    Gust Brooms, CMA

## 2020-12-05 NOTE — Progress Notes (Signed)
Agree with above plan. 

## 2020-12-19 ENCOUNTER — Other Ambulatory Visit: Payer: Self-pay | Admitting: *Deleted

## 2020-12-19 ENCOUNTER — Other Ambulatory Visit: Payer: Self-pay

## 2020-12-19 ENCOUNTER — Ambulatory Visit (INDEPENDENT_AMBULATORY_CARE_PROVIDER_SITE_OTHER): Payer: 59 | Admitting: Sports Medicine

## 2020-12-19 VITALS — BP 122/76 | HR 77

## 2020-12-19 DIAGNOSIS — E291 Testicular hypofunction: Secondary | ICD-10-CM | POA: Diagnosis not present

## 2020-12-19 DIAGNOSIS — M5136 Other intervertebral disc degeneration, lumbar region: Secondary | ICD-10-CM

## 2020-12-19 MED ORDER — TESTOSTERONE CYPIONATE 200 MG/ML IM SOLN
200.0000 mg | Freq: Once | INTRAMUSCULAR | Status: AC
Start: 2020-12-19 — End: 2020-12-19
  Administered 2020-12-19: 200 mg via INTRAMUSCULAR

## 2020-12-19 MED ORDER — TRAMADOL HCL 50 MG PO TABS: 50.0000 mg | ORAL_TABLET | Freq: Three times a day (TID) | ORAL | 0 refills | Status: DC | PRN

## 2020-12-19 NOTE — Progress Notes (Signed)
Testosterone given LUOQ without immediate complications.  Next injection in 14 days.

## 2020-12-26 ENCOUNTER — Other Ambulatory Visit: Payer: Self-pay | Admitting: *Deleted

## 2020-12-26 ENCOUNTER — Other Ambulatory Visit: Payer: Self-pay | Admitting: Sports Medicine

## 2020-12-26 DIAGNOSIS — M5136 Other intervertebral disc degeneration, lumbar region: Secondary | ICD-10-CM

## 2020-12-26 MED ORDER — TRAMADOL HCL 50 MG PO TABS: 50.0000 mg | ORAL_TABLET | Freq: Three times a day (TID) | ORAL | 0 refills | Status: DC | PRN

## 2020-12-26 NOTE — Telephone Encounter (Signed)
Pt needs prescription sent to [facility].  It was originally sent to [facility] but was not picked up.  I verified this with [facility].

## 2020-12-26 NOTE — Telephone Encounter (Signed)
Oh!

## 2021-01-02 ENCOUNTER — Ambulatory Visit (INDEPENDENT_AMBULATORY_CARE_PROVIDER_SITE_OTHER): Payer: 59 | Admitting: Sports Medicine

## 2021-01-02 ENCOUNTER — Other Ambulatory Visit: Payer: Self-pay

## 2021-01-02 VITALS — BP 139/85 | HR 67

## 2021-01-02 DIAGNOSIS — E291 Testicular hypofunction: Secondary | ICD-10-CM | POA: Diagnosis not present

## 2021-01-02 DIAGNOSIS — I10 Essential (primary) hypertension: Secondary | ICD-10-CM

## 2021-01-02 DIAGNOSIS — Z125 Encounter for screening for malignant neoplasm of prostate: Secondary | ICD-10-CM

## 2021-01-02 MED ORDER — TESTOSTERONE CYPIONATE 200 MG/ML IM SOLN
100.0000 mg | Freq: Once | INTRAMUSCULAR | Status: AC
  Administered 2021-01-02: 100 mg via INTRAMUSCULAR

## 2021-01-02 MED ORDER — CARVEDILOL 25 MG PO TABS: 25.0000 mg | ORAL_TABLET | Freq: Two times a day (BID) | ORAL | 1 refills | Status: DC

## 2021-01-02 MED ORDER — LISINOPRIL-HYDROCHLOROTHIAZIDE 20-25 MG PO TABS: 1.0000 | ORAL_TABLET | Freq: Every day | ORAL | 1 refills | Status: DC

## 2021-01-02 NOTE — Progress Notes (Signed)
 Established Patient Office Visit  Subjective:  Patient ID: Richard Beltran, male    DOB: 08/05/1965  Age: 55 y.o. MRN: [id number]  CC:  Chief Complaint  Patient presents with   Hypogonadism    HPI Richard Beltran is here for a testosterone injection. Denies chest pain, shortness of breath, headaches or mood changes.    Past Medical History:  Diagnosis Date   Back pain    Hyperlipidemia    Hypertension    Hypertension    Joint pain    Lumbar degenerative disc disease 10/29/2013   Post L4-L5 fusion.    Male hypogonadism    Other fatigue    Shortness of breath on exertion     Past Surgical History:  Procedure Laterality Date   BACK SURGERY  2005    Family History  Problem Relation Age of Onset   Diabetes Mother    Hyperlipidemia Mother    Hypertension Mother    Healthy Father    Hypertension Father    Healthy Maternal Grandmother    Healthy Maternal Grandfather    Healthy Paternal Grandmother    Healthy Paternal Grandfather    Colon cancer Neg Hx    Colon polyps Neg Hx    Esophageal cancer Neg Hx    Rectal cancer Neg Hx    Stomach cancer Neg Hx     Social History   Socioeconomic History   Marital status: Married    Spouse name: Shelby   Number of children: 4   Years of education: 18+   Highest education level: Not on file  Occupational History   Occupation: Paster  Tobacco Use   Smoking status: Never   Smokeless tobacco: Never  Vaping Use   Vaping Use: Never used  Substance and Sexual Activity   Alcohol use: No   Drug use: No   Sexual activity: Yes    Partners: Female    Birth control/protection: None  Other Topics Concern   Not on file  Social History Narrative   Fun: Golf and read   Denies religious beliefs effecting healthcare.    Social Determinants of Health   Financial Resource Strain: Not on file  Food Insecurity: Not on file  Transportation Needs: Not on file  Physical Activity: Not on file  Stress: Not on file  Social  Connections: Not on file  Intimate Partner Violence: Not on file    Outpatient Medications Prior to Visit  Medication Sig Dispense Refill   amLODipine (NORVASC) 10 MG tablet Take 1 tablet (10 mg total) by mouth daily. 90 tablet 3   meloxicam (MOBIC) 15 MG tablet Take 1 tablet (15 mg total) by mouth daily. 90 tablet 1   testosterone cypionate (DEPOTESTOTERONE CYPIONATE) 100 MG/ML injection Inject 200 mg into the muscle every 14 (fourteen) days. Inject 2mL (200mg total) into the muscle once every 14 days.     traMADol (ULTRAM) 50 MG tablet Take 1-2 tablets (50-100 mg total) by mouth 3 (three) times daily as needed (pain). 180 tablet 0   carvedilol (COREG) 25 MG tablet Take 1 tablet (25 mg total) by mouth 2 (two) times daily with a meal. 180 tablet 1   lisinopril-hydrochlorothiazide (ZESTORETIC) 20-25 MG tablet TAKE 1 TABLET BY MOUTH EVERY DAY 90 tablet 1   No facility-administered medications prior to visit.    Allergies  Allergen Reactions   Eggs Or Egg-Derived Products Nausea And Vomiting    ROS Review of Systems    Objective:    Physical   Exam  BP 139/85   Pulse 67   SpO2 98%  Wt Readings from Last 3 Encounters:  11/21/20 275 lb (124.7 kg)  10/21/20 275 lb (124.7 kg)  10/13/20 275 lb (124.7 kg)     Health Maintenance Due  Topic Date Due   Zoster Vaccines- Shingrix (1 of 2) Never done   COVID-19 Vaccine (3 - Booster for Pfizer series) 01/14/2020    There are no preventive care reminders to display for this patient.  Lab Results  Component Value Date   TSH 1.500 10/02/2020   Lab Results  Component Value Date   WBC 6.1 10/02/2020   HGB 14.6 10/02/2020   HCT 45.6 10/02/2020   MCV 92 10/02/2020   PLT 226 10/02/2020   Lab Results  Component Value Date   NA 141 10/02/2020   K 4.2 10/02/2020   CO2 25 10/02/2020   GLUCOSE 81 10/02/2020   BUN 19 10/02/2020   CREATININE 1.28 (H) 10/02/2020   BILITOT 0.6 10/02/2020   ALKPHOS 60 10/02/2020   AST 19 10/02/2020    ALT 22 10/02/2020   PROT 7.3 10/02/2020   ALBUMIN 4.2 10/02/2020   CALCIUM 9.8 10/02/2020   EGFR 67 10/02/2020   Lab Results  Component Value Date   CHOL 209 (H) 10/02/2020   Lab Results  Component Value Date   HDL 38 (L) 10/02/2020   Lab Results  Component Value Date   LDLCALC 145 (H) 10/02/2020   Lab Results  Component Value Date   TRIG 143 10/02/2020   Lab Results  Component Value Date   CHOLHDL 5.9 (H) 08/30/2019   Lab Results  Component Value Date   HGBA1C 5.1 10/02/2020      Assessment & Plan:  Hypogonadism - Patient tolerated injection well without complications. Patient advised to schedule next injection 14 days from today. Patient advised to come in 7 days for blood work. Testosterone and PSA.    Problem List Items Addressed This Visit     Hypertension, essential, benign   Relevant Medications   lisinopril-hydrochlorothiazide (ZESTORETIC) 20-25 MG tablet   carvedilol (COREG) 25 MG tablet   Male hypogonadism - Primary   Relevant Orders   Testosterone Total,Free,Bio, Males   PSA   Other Visit Diagnoses     Screening for prostate cancer       Relevant Orders   PSA       Meds ordered this encounter  Medications   testosterone cypionate (DEPOTESTOSTERONE CYPIONATE) injection 100 mg   lisinopril-hydrochlorothiazide (ZESTORETIC) 20-25 MG tablet    Sig: Take 1 tablet by mouth daily.    Dispense:  90 tablet    Refill:  1   carvedilol (COREG) 25 MG tablet    Sig: Take 1 tablet (25 mg total) by mouth 2 (two) times daily with a meal.    Dispense:  180 tablet    Refill:  1    Follow-up: Return in about 1 week (around 01/09/2021) for lab work and 2 weeks for testosterone injection. .    Tuttle, Angela Hale, CMA 

## 2021-01-16 ENCOUNTER — Ambulatory Visit (INDEPENDENT_AMBULATORY_CARE_PROVIDER_SITE_OTHER): Payer: 59 | Admitting: Sports Medicine

## 2021-01-16 VITALS — BP 132/62 | HR 82 | Ht 72.0 in | Wt 282.0 lb

## 2021-01-16 DIAGNOSIS — E291 Testicular hypofunction: Secondary | ICD-10-CM | POA: Diagnosis not present

## 2021-01-16 MED ORDER — TESTOSTERONE CYPIONATE 200 MG/ML IM SOLN
200.0000 mg | Freq: Once | INTRAMUSCULAR | Status: AC
  Administered 2021-01-16: 200 mg via INTRAMUSCULAR

## 2021-01-16 NOTE — Progress Notes (Signed)
Testosterone given LUOQ without immediate complications.  Next injection in 2 weeks.

## 2021-01-30 ENCOUNTER — Other Ambulatory Visit: Payer: Self-pay

## 2021-01-30 ENCOUNTER — Ambulatory Visit (INDEPENDENT_AMBULATORY_CARE_PROVIDER_SITE_OTHER): Payer: 59

## 2021-01-30 ENCOUNTER — Ambulatory Visit (INDEPENDENT_AMBULATORY_CARE_PROVIDER_SITE_OTHER): Payer: 59 | Admitting: Sports Medicine

## 2021-01-30 VITALS — BP 135/82 | HR 60

## 2021-01-30 DIAGNOSIS — M5136 Other intervertebral disc degeneration, lumbar region: Secondary | ICD-10-CM | POA: Diagnosis not present

## 2021-01-30 DIAGNOSIS — M23204 Derangement of unspecified medial meniscus due to old tear or injury, left knee: Secondary | ICD-10-CM

## 2021-01-30 DIAGNOSIS — E291 Testicular hypofunction: Secondary | ICD-10-CM | POA: Diagnosis not present

## 2021-01-30 MED ORDER — TESTOSTERONE CYPIONATE 200 MG/ML IM SOLN
200.0000 mg | Freq: Once | INTRAMUSCULAR | Status: AC
  Administered 2021-01-30: 200 mg via INTRAMUSCULAR

## 2021-01-30 MED ORDER — TRAMADOL HCL 50 MG PO TABS: 50.0000 mg | ORAL_TABLET | Freq: Three times a day (TID) | ORAL | 0 refills | Status: DC | PRN

## 2021-01-30 NOTE — Progress Notes (Signed)
Patient is here for testosterone injection. Denies chest pain, shortness of breath, headaches, and problems with medication or mood changes.   Patient tolerated testosterone 200 mg injection to RUOQ well without complications. Patient advised to schedule next injection in 14 days.

## 2021-01-30 NOTE — Assessment & Plan Note (Signed)
Increasing pain left knee, aspiration injection today, return to see me as needed.

## 2021-01-30 NOTE — Assessment & Plan Note (Signed)
Testosterone shot today, due for some labs. He will get them done a week.

## 2021-01-30 NOTE — Progress Notes (Signed)
    Procedures performed today:    Procedure: Real-time Ultrasound Guided aspiration/injection left knee. Device: Samsung HS60  Verbal informed consent obtained.  Time-out conducted.  Noted no overlying erythema, induration, or other signs of local infection.  Skin prepped in a sterile fashion.  Local anesthesia: Topical Ethyl chloride.  With sterile technique and under real time ultrasound guidance: I advanced an 18-gauge needle into the suprapatellar recess, aspirated 37 mL of clear, straw-colored fluid, syringe switched and 1 cc Kenalog 40, 2 cc lidocaine, 2 cc bupivacaine injected easily Completed without difficulty  Advised to call if fevers/chills, erythema, induration, drainage, or persistent bleeding.  Images permanently stored and available for review in PACS.  Impression: Technically successful ultrasound guided injection.  Independent interpretation of notes and tests performed by another provider:   None.  Brief History, Exam, Impression, and Recommendations:    Chronic meniscal tear of left knee Increasing pain left knee, aspiration injection today, return to see me as needed.  Male hypogonadism Testosterone shot today, due for some labs. He will get them done a week.  Chronic process with exacerbation and pharmacologic dimension.  ___________________________________________ Gwen Her. Dianah Field, M.D., ABFM., CAQSM. Primary Care and [place] Instructor of [place] of [facility] of Medicine

## 2021-02-13 ENCOUNTER — Ambulatory Visit (INDEPENDENT_AMBULATORY_CARE_PROVIDER_SITE_OTHER): Payer: 59 | Admitting: Sports Medicine

## 2021-02-13 VITALS — BP 134/88 | HR 67

## 2021-02-13 DIAGNOSIS — E291 Testicular hypofunction: Secondary | ICD-10-CM

## 2021-02-13 MED ORDER — TESTOSTERONE CYPIONATE 200 MG/ML IM SOLN
200.0000 mg | Freq: Once | INTRAMUSCULAR | Status: AC
  Administered 2021-02-13: 200 mg via INTRAMUSCULAR

## 2021-02-13 NOTE — Assessment & Plan Note (Signed)
We did give the testosterone shot again to the patient, he understands in 1 week he needs to get labs and if he has not gotten them we will not provide any more testosterone injections.

## 2021-02-13 NOTE — Progress Notes (Signed)
Patient is here for testosterone injection. Denies chest pain, shortness of breath, headaches, and problems with medication or mood changes.   Patient is overdue for lab work. He was to go last week but forgot. Lab requisition given to patient again. Per Dr. Dianah Field patient needs to have labs next week or we will not be able to administer anymore testosterone injections.   Patient tolerated testosterone 200 mg injection to LUOQ well without complications. Patient advised to schedule next injection in 14 days.

## 2021-02-27 ENCOUNTER — Telehealth: Payer: Self-pay

## 2021-02-27 ENCOUNTER — Ambulatory Visit: Payer: 59

## 2021-02-27 DIAGNOSIS — Z5181 Encounter for therapeutic drug level monitoring: Secondary | ICD-10-CM

## 2021-02-27 DIAGNOSIS — Z79890 Hormone replacement therapy (postmenopausal): Secondary | ICD-10-CM

## 2021-02-27 DIAGNOSIS — E291 Testicular hypofunction: Secondary | ICD-10-CM

## 2021-02-27 NOTE — Telephone Encounter (Signed)
Pt was scheduled for testosterone injection today.  However, pt did not have labs drawn last week as instructed prior to his next injection.  Spoke with Dr. Darene Lamer who states that he cannot have injection today and must have labs done prior to next injection.  Spoke with pt and advised him of this information.  Pt expressed understanding and states that he will come next week to have labs drawn.  Per Dr. Darene Lamer, order for CBC added to orders in place from 01/02/2021.  Charyl Bigger, CMA

## 2021-03-23 ENCOUNTER — Other Ambulatory Visit: Payer: Self-pay | Admitting: Sports Medicine

## 2021-03-23 ENCOUNTER — Telehealth: Payer: Self-pay | Admitting: Sports Medicine

## 2021-03-23 DIAGNOSIS — M5136 Other intervertebral disc degeneration, lumbar region: Secondary | ICD-10-CM

## 2021-03-23 NOTE — Telephone Encounter (Signed)
Sent to pharmacy 

## 2021-03-23 NOTE — Telephone Encounter (Signed)
Patient came in to have lab work done & stated he needed a refill on medication listed below. Please Advise.   traMADol Veatrice Bourbon) 50 MG tablet    [place], [place] Phone:  [phone]  Fax:  [phone]

## 2021-03-23 NOTE — Telephone Encounter (Signed)
Attempted to call twice to let patient know medication has been sent to pharmacy, no answer and no VM box set up. AM

## 2021-03-24 ENCOUNTER — Other Ambulatory Visit: Payer: Self-pay

## 2021-03-24 ENCOUNTER — Ambulatory Visit (INDEPENDENT_AMBULATORY_CARE_PROVIDER_SITE_OTHER): Payer: 59 | Admitting: Sports Medicine

## 2021-03-24 VITALS — BP 135/82 | HR 69

## 2021-03-24 DIAGNOSIS — E291 Testicular hypofunction: Secondary | ICD-10-CM

## 2021-03-24 LAB — CBC WITH DIFFERENTIAL/PLATELET
Absolute Monocytes: 391 cells/uL (ref 200–950)
Basophils Absolute: 43 cells/uL (ref 0–200)
Basophils Relative: 0.6 %
Eosinophils Absolute: 234 cells/uL (ref 15–500)
Eosinophils Relative: 3.3 %
HCT: 45.1 % (ref 38.5–50.0)
Hemoglobin: 14.6 g/dL (ref 13.2–17.1)
Lymphs Abs: 2606 cells/uL (ref 850–3900)
MCH: 29 pg (ref 27.0–33.0)
MCHC: 32.4 g/dL (ref 32.0–36.0)
MCV: 89.5 fL (ref 80.0–100.0)
MPV: 10.2 fL (ref 7.5–12.5)
Monocytes Relative: 5.5 %
Neutro Abs: 3827 cells/uL (ref 1500–7800)
Neutrophils Relative %: 53.9 %
Platelets: 223 10*3/uL (ref 140–400)
RBC: 5.04 10*6/uL (ref 4.20–5.80)
RDW: 12.6 % (ref 11.0–15.0)
Total Lymphocyte: 36.7 %
WBC: 7.1 10*3/uL (ref 3.8–10.8)

## 2021-03-24 LAB — TESTOSTERONE TOTAL,FREE,BIO, MALES
Albumin: 4.4 g/dL (ref 3.6–5.1)
Sex Hormone Binding: 29 nmol/L (ref 10–50)
Testosterone: 111 ng/dL — ABNORMAL LOW (ref 250–827)

## 2021-03-24 LAB — PSA: PSA: 0.4 ng/mL (ref <=4.00)

## 2021-03-24 MED ORDER — TESTOSTERONE CYPIONATE 200 MG/ML IM SOLN
200.0000 mg | INTRAMUSCULAR | Status: DC
  Administered 2021-03-24: 200 mg via INTRAMUSCULAR

## 2021-03-24 NOTE — Progress Notes (Signed)
HPI:  Patient is here for a testosterone injection.  Denies chest pains, shortness of breath, headaches and problems with medication or mood changes.  Assessment and Plan:  Patient tolerated injection well without complications.  Patient advised to schedule next injection in 14 days.  Charyl Bigger, CMA

## 2021-04-07 ENCOUNTER — Other Ambulatory Visit: Payer: Self-pay

## 2021-04-07 ENCOUNTER — Ambulatory Visit (INDEPENDENT_AMBULATORY_CARE_PROVIDER_SITE_OTHER): Payer: 59 | Admitting: Sports Medicine

## 2021-04-07 VITALS — BP 140/80 | HR 74

## 2021-04-07 DIAGNOSIS — E291 Testicular hypofunction: Secondary | ICD-10-CM | POA: Diagnosis not present

## 2021-04-07 MED ORDER — TESTOSTERONE CYPIONATE 200 MG/ML IM SOLN
200.0000 mg | INTRAMUSCULAR | Status: DC
  Administered 2021-04-07: 200 mg via INTRAMUSCULAR

## 2021-04-07 NOTE — Progress Notes (Signed)
HPI:  Patient is here for a testosterone injection.  Denies chest pains, shortness of breath, headaches and problems with medication or mood changes.  Assessment and Plan:  Testosterone administered in L UOQ.  Patient tolerated injection well without complications.  Patient advised to schedule next injection in 14 days.  Charyl Bigger, CMA

## 2021-04-21 ENCOUNTER — Other Ambulatory Visit: Payer: Self-pay

## 2021-04-21 ENCOUNTER — Ambulatory Visit (INDEPENDENT_AMBULATORY_CARE_PROVIDER_SITE_OTHER): Payer: 59 | Admitting: Sports Medicine

## 2021-04-21 VITALS — BP 137/75 | HR 80

## 2021-04-21 DIAGNOSIS — E291 Testicular hypofunction: Secondary | ICD-10-CM

## 2021-04-21 MED ORDER — TESTOSTERONE CYPIONATE 200 MG/ML IM SOLN
200.0000 mg | INTRAMUSCULAR | Status: DC
  Administered 2021-04-21 – 2021-07-02 (×5): 200 mg via INTRAMUSCULAR

## 2021-04-21 NOTE — Progress Notes (Signed)
HPI:  Patient is here for a testosterone injection.  Denies chest pains, shortness of breath, headaches and problems with medication or mood changes.  Assessment and Plan:  Injection administered in RUOQ.  Patient tolerated injection well without complications.  Patient advised to schedule next injection in 14 days.  Charyl Bigger, CMA

## 2021-05-06 ENCOUNTER — Ambulatory Visit (INDEPENDENT_AMBULATORY_CARE_PROVIDER_SITE_OTHER): Payer: Self-pay | Admitting: Sports Medicine

## 2021-05-06 ENCOUNTER — Other Ambulatory Visit: Payer: Self-pay

## 2021-05-06 VITALS — BP 130/77 | HR 66

## 2021-05-06 DIAGNOSIS — E291 Testicular hypofunction: Secondary | ICD-10-CM

## 2021-05-06 DIAGNOSIS — M5136 Other intervertebral disc degeneration, lumbar region: Secondary | ICD-10-CM

## 2021-05-06 DIAGNOSIS — M51369 Other intervertebral disc degeneration, lumbar region without mention of lumbar back pain or lower extremity pain: Secondary | ICD-10-CM

## 2021-05-06 MED ORDER — TESTOSTERONE CYPIONATE 200 MG/ML IM SOLN
200.0000 mg | Freq: Once | INTRAMUSCULAR | Status: AC
  Administered 2021-05-06: 200 mg via INTRAMUSCULAR

## 2021-05-06 MED ORDER — TRAMADOL HCL 50 MG PO TABS: ORAL_TABLET | ORAL | 0 refills | Status: DC

## 2021-05-06 NOTE — Progress Notes (Signed)
HPI:  Patient is here for a testosterone injection.  Denies chest pains, shortness of breath, headaches and problems with medication or mood changes.  Assessment and Plan:  Patient tolerated injection well without complications.  Patient advised to schedule next injection in 14 days.  Pt is also requesting refill on tramadol.  Charyl Bigger, CMA (AAMA)

## 2021-05-11 ENCOUNTER — Ambulatory Visit (INDEPENDENT_AMBULATORY_CARE_PROVIDER_SITE_OTHER): Payer: Self-pay | Admitting: Sports Medicine

## 2021-05-11 ENCOUNTER — Ambulatory Visit (INDEPENDENT_AMBULATORY_CARE_PROVIDER_SITE_OTHER): Payer: Self-pay

## 2021-05-11 ENCOUNTER — Other Ambulatory Visit: Payer: Self-pay

## 2021-05-11 DIAGNOSIS — Z0000 Encounter for general adult medical examination without abnormal findings: Secondary | ICD-10-CM

## 2021-05-11 DIAGNOSIS — M23204 Derangement of unspecified medial meniscus due to old tear or injury, left knee: Secondary | ICD-10-CM

## 2021-05-11 NOTE — Assessment & Plan Note (Addendum)
Increasing pain and swelling, injection, return to see me at the end of February at which point we can do an annual physical as well.

## 2021-05-11 NOTE — Assessment & Plan Note (Signed)
Return end of February for annual physical

## 2021-05-11 NOTE — Progress Notes (Signed)
° ° °  Procedures performed today:    Procedure: Real-time Ultrasound Guided injection of the left knee Device: Samsung HS60  Verbal informed consent obtained.  Time-out conducted.  Noted no overlying erythema, induration, or other signs of local infection.  Skin prepped in a sterile fashion.  Local anesthesia: Topical Ethyl chloride.  With sterile technique and under real time ultrasound guidance: Noted trace effusion, 1 cc Kenalog 40, 2 cc lidocaine, 2 cc bupivacaine injected easily Completed without difficulty  Advised to call if fevers/chills, erythema, induration, drainage, or persistent bleeding.  Images permanently stored and available for review in PACS.  Impression: Technically successful ultrasound guided injection.  Independent interpretation of notes and tests performed by another provider:   None.  Brief History, Exam, Impression, and Recommendations:    Chronic meniscal tear of left knee Increasing pain and swelling, injection, return to see me at the end of February at which point we can do an annual physical as well.  Annual physical exam Return end of February for annual physical    ___________________________________________ Gwen Her. Dianah Field, M.D., ABFM., CAQSM. Primary Care and [place] Instructor of [place] of [facility] of Medicine

## 2021-05-21 ENCOUNTER — Other Ambulatory Visit: Payer: Self-pay

## 2021-05-21 ENCOUNTER — Ambulatory Visit (INDEPENDENT_AMBULATORY_CARE_PROVIDER_SITE_OTHER): Payer: Self-pay | Admitting: Sports Medicine

## 2021-05-21 VITALS — BP 142/75 | HR 79

## 2021-05-21 DIAGNOSIS — E291 Testicular hypofunction: Secondary | ICD-10-CM

## 2021-05-21 NOTE — Progress Notes (Signed)
Established Patient Office Visit  Subjective:  Patient ID: Richard Beltran, male    DOB: 17-Oct-1965  Age: 56 y.o. MRN: [id number]  CC:  Chief Complaint  Patient presents with   Hypogonadism    HPI CLEE PANDIT is here for a testosterone injection. Denies chest pain, shortness of breath, headaches or mood changes.    Past Medical History:  Diagnosis Date   Back pain    Hyperlipidemia    Hypertension    Hypertension    Joint pain    Lumbar degenerative disc disease 10/29/2013   Post L4-L5 fusion.    Male hypogonadism    Other fatigue    Shortness of breath on exertion     Past Surgical History:  Procedure Laterality Date   BACK SURGERY  2005    Family History  Problem Relation Age of Onset   Diabetes Mother    Hyperlipidemia Mother    Hypertension Mother    Healthy Father    Hypertension Father    Healthy Maternal Grandmother    Healthy Maternal Grandfather    Healthy Paternal Grandmother    Healthy Paternal Grandfather    Colon cancer Neg Hx    Colon polyps Neg Hx    Esophageal cancer Neg Hx    Rectal cancer Neg Hx    Stomach cancer Neg Hx     Social History   Socioeconomic History   Marital status: Married    Spouse name: [name]   Number of children: 4   Years of education: 18+   Highest education level: Not on file  Occupational History   Occupation: Database administrator  Tobacco Use   Smoking status: Never   Smokeless tobacco: Never  Vaping Use   Vaping Use: Never used  Substance and Sexual Activity   Alcohol use: No   Drug use: No   Sexual activity: Yes    Partners: Female    Birth control/protection: None  Other Topics Concern   Not on file  Social History Narrative   Fun: Golf and read   Denies religious beliefs effecting healthcare.    Social Determinants of Health   Financial Resource Strain: Not on file  Food Insecurity: Not on file  Transportation Needs: Not on file  Physical Activity: Not on file  Stress: Not on file  Social  Connections: Not on file  Intimate Partner Violence: Not on file    Outpatient Medications Prior to Visit  Medication Sig Dispense Refill   amLODipine (NORVASC) 10 MG tablet Take 1 tablet (10 mg total) by mouth daily. 90 tablet 3   carvedilol (COREG) 25 MG tablet Take 1 tablet (25 mg total) by mouth 2 (two) times daily with a meal. 180 tablet 1   lisinopril-hydrochlorothiazide (ZESTORETIC) 20-25 MG tablet Take 1 tablet by mouth daily. 90 tablet 1   meloxicam (MOBIC) 15 MG tablet Take 1 tablet (15 mg total) by mouth daily. 90 tablet 1   testosterone cypionate (DEPOTESTOTERONE CYPIONATE) 100 MG/ML injection Inject 200 mg into the muscle every 14 (fourteen) days. Inject 2m (2047mtotal) into the muscle once every 14 days.     traMADol (ULTRAM) 50 MG tablet TAKE 1 TO 2 TABLETS BY MOUTH THREE TIMES DAILY AS NEEDED FOR PAIN 180 tablet 0   Facility-Administered Medications Prior to Visit  Medication Dose Route Frequency Provider Last Rate Last Admin   testosterone cypionate (DEPOTESTOSTERONE CYPIONATE) injection 200 mg  200 mg Intramuscular Q14 Days ThSilverio DecampMD   200 mg at  05/21/21 1032   testosterone cypionate (DEPOTESTOSTERONE CYPIONATE) injection 200 mg  200 mg Intramuscular Q14 Days Silverio Decamp, MD   200 mg at 03/24/21 1436   testosterone cypionate (DEPOTESTOSTERONE CYPIONATE) injection 200 mg  200 mg Intramuscular Q14 Days Silverio Decamp, MD   200 mg at 04/07/21 1049    Allergies  Allergen Reactions   Eggs Or Egg-Derived Products Nausea And Vomiting    ROS Review of Systems    Objective:    Physical Exam  BP (!) 142/75    Pulse 79    SpO2 99%  Wt Readings from Last 3 Encounters:  01/16/21 282 lb (127.9 kg)  11/21/20 275 lb (124.7 kg)  10/21/20 275 lb (124.7 kg)     Health Maintenance Due  Topic Date Due   Zoster Vaccines- Shingrix (1 of 2) Never done   COVID-19 Vaccine (3 - Booster for Pfizer series) 10/09/2019    There are no preventive  care reminders to display for this patient.  Lab Results  Component Value Date   TSH 1.500 10/02/2020   Lab Results  Component Value Date   WBC 7.1 03/23/2021   HGB 14.6 03/23/2021   HCT 45.1 03/23/2021   MCV 89.5 03/23/2021   PLT 223 03/23/2021   Lab Results  Component Value Date   NA 141 10/02/2020   K 4.2 10/02/2020   CO2 25 10/02/2020   GLUCOSE 81 10/02/2020   BUN 19 10/02/2020   CREATININE 1.28 (H) 10/02/2020   BILITOT 0.6 10/02/2020   ALKPHOS 60 10/02/2020   AST 19 10/02/2020   ALT 22 10/02/2020   PROT 7.3 10/02/2020   ALBUMIN 4.2 10/02/2020   CALCIUM 9.8 10/02/2020   EGFR 67 10/02/2020   Lab Results  Component Value Date   CHOL 209 (H) 10/02/2020   Lab Results  Component Value Date   HDL 38 (L) 10/02/2020   Lab Results  Component Value Date   LDLCALC 145 (H) 10/02/2020   Lab Results  Component Value Date   TRIG 143 10/02/2020   Lab Results  Component Value Date   CHOLHDL 5.9 (H) 08/30/2019   Lab Results  Component Value Date   HGBA1C 5.1 10/02/2020      Assessment & Plan:  Hypogonadism - Patient tolerated injection well without complications. Patient advised to schedule next injection 14 days from today.    Problem List Items Addressed This Visit   None Visit Diagnoses     Hypogonadism in male    -  Primary       No orders of the defined types were placed in this encounter.   Follow-up: Return in about 2 weeks (around 06/04/2021) for Testosterone injection. Durene Romans, Monico Blitz, [place]

## 2021-06-04 ENCOUNTER — Other Ambulatory Visit: Payer: Self-pay

## 2021-06-04 ENCOUNTER — Ambulatory Visit (INDEPENDENT_AMBULATORY_CARE_PROVIDER_SITE_OTHER): Payer: Self-pay | Admitting: Sports Medicine

## 2021-06-04 VITALS — BP 121/67 | HR 70

## 2021-06-04 DIAGNOSIS — E291 Testicular hypofunction: Secondary | ICD-10-CM

## 2021-06-04 NOTE — Progress Notes (Signed)
Patient is here for testosterone injection. Denies chest pain, shortness of breath, headaches, and problems with medication or mood changes.   Patient tolerated testosterone 200 mg injection to LUOQ well without complications. Patient advised to schedule next injection in 14 days.

## 2021-06-18 ENCOUNTER — Ambulatory Visit (INDEPENDENT_AMBULATORY_CARE_PROVIDER_SITE_OTHER): Payer: Self-pay | Admitting: Sports Medicine

## 2021-06-18 ENCOUNTER — Other Ambulatory Visit: Payer: Self-pay

## 2021-06-18 VITALS — BP 115/73 | HR 69 | Wt 284.0 lb

## 2021-06-18 DIAGNOSIS — E291 Testicular hypofunction: Secondary | ICD-10-CM

## 2021-06-18 DIAGNOSIS — Z0000 Encounter for general adult medical examination without abnormal findings: Secondary | ICD-10-CM

## 2021-06-18 DIAGNOSIS — E6609 Other obesity due to excess calories: Secondary | ICD-10-CM

## 2021-06-18 MED ORDER — WEGOVY 0.25 MG/0.5ML ~~LOC~~ SOAJ: 0.2500 mg | SUBCUTANEOUS | 0 refills | Status: DC

## 2021-06-18 NOTE — Assessment & Plan Note (Signed)
Annual physical as above.  

## 2021-06-18 NOTE — Assessment & Plan Note (Signed)
Testosterone injection today, we will check his labs in a week.

## 2021-06-18 NOTE — Progress Notes (Signed)
Subjective:    CC: Annual Physical Exam  HPI:  This patient is here for their annual physical  I reviewed the past medical history, family history, social history, surgical history, and allergies today and no changes were needed.  Please see the problem list section below in epic for further details.  Past Medical History: Past Medical History:  Diagnosis Date   Back pain    Hyperlipidemia    Hypertension    Hypertension    Joint pain    Lumbar degenerative disc disease 10/29/2013   Post L4-L5 fusion.    Male hypogonadism    Other fatigue    Shortness of breath on exertion    Past Surgical History: Past Surgical History:  Procedure Laterality Date   BACK SURGERY  2005   Social History: Social History   Socioeconomic History   Marital status: Married    Spouse name: [name]   Number of children: 4   Years of education: 18+   Highest education level: Not on file  Occupational History   Occupation: Database administrator  Tobacco Use   Smoking status: Never   Smokeless tobacco: Never  Vaping Use   Vaping Use: Never used  Substance and Sexual Activity   Alcohol use: No   Drug use: No   Sexual activity: Yes    Partners: Female    Birth control/protection: None  Other Topics Concern   Not on file  Social History Narrative   Fun: Golf and read   Denies religious beliefs effecting healthcare.    Social Determinants of Health   Financial Resource Strain: Not on file  Food Insecurity: Not on file  Transportation Needs: Not on file  Physical Activity: Not on file  Stress: Not on file  Social Connections: Not on file   Family History: Family History  Problem Relation Age of Onset   Diabetes Mother    Hyperlipidemia Mother    Hypertension Mother    Healthy Father    Hypertension Father    Healthy Maternal Grandmother    Healthy Maternal Grandfather    Healthy Paternal Grandmother    Healthy Paternal Grandfather    Colon cancer Neg Hx    Colon polyps Neg Hx     Esophageal cancer Neg Hx    Rectal cancer Neg Hx    Stomach cancer Neg Hx    Allergies: Allergies  Allergen Reactions   Eggs Or Egg-Derived Products Nausea And Vomiting   Medications: See med rec.  Review of Systems: No headache, visual changes, nausea, vomiting, diarrhea, constipation, dizziness, abdominal pain, skin rash, fevers, chills, night sweats, swollen lymph nodes, weight loss, chest pain, body aches, joint swelling, muscle aches, shortness of breath, mood changes, visual or auditory hallucinations.  Objective:    General: Well Developed, well nourished, and in no acute distress.  Neuro: Alert and oriented x3, extra-ocular muscles intact, sensation grossly intact. Cranial nerves II through XII are intact, motor, sensory, and coordinative functions are all intact. HEENT: Normocephalic, atraumatic, pupils equal round reactive to light, neck supple, no masses, no lymphadenopathy, thyroid nonpalpable. Oropharynx, nasopharynx, external ear canals are unremarkable. Skin: Warm and dry, no rashes noted.  Cardiac: Regular rate and rhythm, no murmurs rubs or gallops.  Respiratory: Clear to auscultation bilaterally. Not using accessory muscles, speaking in full sentences.  Abdominal: Soft, nontender, nondistended, positive bowel sounds, no masses, no organomegaly.  Musculoskeletal: Shoulder, elbow, wrist, hip, knee, ankle stable, and with full range of motion.  Sebaceous cyst behind the right thigh, asymptomatic.  Impression and Recommendations:    The patient was counselled, risk factors were discussed, anticipatory guidance given.  Annual physical exam Annual physical as above.   Male hypogonadism Testosterone injection today, we will check his labs in a week.  Obesity Checking routine labs, patient will be part of a multidisciplinary weight loss plan including calorie counting, exercise, he has struggled with his weight for years. Target weight for him will be approximately 140  to 190 pounds. Starting with Mancel Parsons.   ___________________________________________ Gwen Her. Dianah Field, M.D., ABFM., CAQSM. Primary Care and Sports Medicine [facility]  Adjunct Professor of [employer]

## 2021-06-18 NOTE — Addendum Note (Signed)
Addended by: Gust Brooms on: 06/18/2021 09:45 AM   Modules accepted: Orders

## 2021-06-18 NOTE — Assessment & Plan Note (Signed)
Checking routine labs, patient will be part of a multidisciplinary weight loss plan including calorie counting, exercise, he has struggled with his weight for years. Target weight for him will be approximately 140 to 190 pounds. Starting with APTCKF.

## 2021-06-25 ENCOUNTER — Other Ambulatory Visit: Payer: Self-pay

## 2021-06-25 ENCOUNTER — Other Ambulatory Visit: Payer: Self-pay | Admitting: Sports Medicine

## 2021-06-25 DIAGNOSIS — M5136 Other intervertebral disc degeneration, lumbar region: Secondary | ICD-10-CM

## 2021-06-25 MED ORDER — TRAMADOL HCL 50 MG PO TABS: ORAL_TABLET | ORAL | 0 refills | Status: DC

## 2021-06-25 NOTE — Telephone Encounter (Signed)
Pt came in for labs today and requested a refill of his Tramadol be sent to [facility] on [address] in [place]

## 2021-06-26 ENCOUNTER — Telehealth: Payer: Self-pay

## 2021-06-26 DIAGNOSIS — E6609 Other obesity due to excess calories: Secondary | ICD-10-CM

## 2021-06-26 NOTE — Telephone Encounter (Signed)
Spoke with patient regarding lab results. He stated that the wegovy needed a PA.

## 2021-06-29 LAB — LIPID PANEL
Cholesterol: 228 mg/dL — ABNORMAL HIGH (ref <200)
HDL: 36 mg/dL — ABNORMAL LOW (ref >=40)
LDL Cholesterol (Calc): 165 mg/dL (calc) — ABNORMAL HIGH
Non-HDL Cholesterol (Calc): 192 mg/dL (calc) — ABNORMAL HIGH (ref <130)
Total CHOL/HDL Ratio: 6.3 (calc) — ABNORMAL HIGH (ref <5.0)
Triglycerides: 131 mg/dL (ref <150)

## 2021-06-29 LAB — COMPREHENSIVE METABOLIC PANEL
AG Ratio: 1.6 (calc) (ref 1.0–2.5)
ALT: 22 U/L (ref 9–46)
AST: 17 U/L (ref 10–35)
Albumin: 4.4 g/dL (ref 3.6–5.1)
Alkaline phosphatase (APISO): 49 U/L (ref 35–144)
BUN: 16 mg/dL (ref 7–25)
CO2: 30 mmol/L (ref 20–32)
Calcium: 9.5 mg/dL (ref 8.6–10.3)
Chloride: 103 mmol/L (ref 98–110)
Creat: 1.16 mg/dL (ref 0.70–1.30)
Globulin: 2.7 g/dL (calc) (ref 1.9–3.7)
Glucose, Bld: 89 mg/dL (ref 65–99)
Potassium: 4 mmol/L (ref 3.5–5.3)
Sodium: 139 mmol/L (ref 135–146)
Total Bilirubin: 0.8 mg/dL (ref 0.2–1.2)
Total Protein: 7.1 g/dL (ref 6.1–8.1)

## 2021-06-29 LAB — TESTOSTERONE, FREE & TOTAL
Free Testosterone: 128.6 pg/mL (ref 35.0–155.0)
Testosterone, Total, LC-MS-MS: 795 ng/dL (ref 250–1100)

## 2021-06-29 LAB — CBC
HCT: 46.9 % (ref 38.5–50.0)
Hemoglobin: 15.5 g/dL (ref 13.2–17.1)
MCH: 29.5 pg (ref 27.0–33.0)
MCHC: 33 g/dL (ref 32.0–36.0)
MCV: 89.2 fL (ref 80.0–100.0)
MPV: 9.8 fL (ref 7.5–12.5)
Platelets: 244 10*3/uL (ref 140–400)
RBC: 5.26 10*6/uL (ref 4.20–5.80)
RDW: 12.2 % (ref 11.0–15.0)
WBC: 6.1 10*3/uL (ref 3.8–10.8)

## 2021-06-29 LAB — PSA, TOTAL AND FREE
PSA, % Free: 50 % (calc) (ref >25)
PSA, Free: 0.5 ng/mL
PSA, Total: 1 ng/mL (ref <=4.0)

## 2021-06-29 LAB — HEMOGLOBIN A1C
Hgb A1c MFr Bld: 5 % of total Hgb (ref <5.7)
Mean Plasma Glucose: 97 mg/dL
eAG (mmol/L): 5.4 mmol/L

## 2021-06-29 LAB — TSH: TSH: 1.5 mIU/L (ref 0.40–4.50)

## 2021-07-01 ENCOUNTER — Telehealth: Payer: Self-pay

## 2021-07-01 NOTE — Telephone Encounter (Addendum)
Initiated Prior authorization MBO:MQTTCN 0.25MG /0.5ML auto-injectors ?Via: Covermymeds ?Case/Key:[id number] ?Status: denied as of 07/01/21 ?[facility] Team is unable to review this request for prior authorization as there is a closed record on file with duplicate information, Case ID: [id number]. This case was closed on 07/01/2021 because the requested medication is not on the patient's formulary. ?Notified Pt via: Pt does not have Mychart ?

## 2021-07-02 ENCOUNTER — Other Ambulatory Visit: Payer: Self-pay

## 2021-07-02 ENCOUNTER — Ambulatory Visit (INDEPENDENT_AMBULATORY_CARE_PROVIDER_SITE_OTHER): Payer: Self-pay | Admitting: Sports Medicine

## 2021-07-02 DIAGNOSIS — E291 Testicular hypofunction: Secondary | ICD-10-CM

## 2021-07-02 MED ORDER — MOUNJARO 2.5 MG/0.5ML ~~LOC~~ SOAJ: 2.5000 mg | SUBCUTANEOUS | 0 refills | Status: DC

## 2021-07-02 NOTE — Telephone Encounter (Signed)
Darcel Bayley will probably need a PA as well. See note.  ?

## 2021-07-02 NOTE — Telephone Encounter (Signed)
Got it, lets try [facility], I will send it in. ?

## 2021-07-02 NOTE — Telephone Encounter (Signed)
Message from plan: PA Case: [id number], Status: Closed, Closed Reason Code: FM Product not covered by this plan. Prior Authorization not available., Closed Rationale: [employer] Prior Authorization team is unable to review this product for a coverage determination as the requested medication is not on the patient's formulary. Please reach out to [employer] directly for this request. Thank you in advance.. Questions? Contact [phone]. ? ?Patient made aware. Will send to Dr. Dianah Field for review and possible alternatives?  ?

## 2021-07-02 NOTE — Progress Notes (Signed)
Patient is here for testosterone injection. Denies chest pain, shortness of breath, headaches, and problems with medication or mood changes.  ? ?Patient tolerated testosterone 200 mg injection to LUOQ well without complications. Patient advised to schedule next injection in 14 days.  ? ?He asked about [facility]. Let him know this was denied due to not covered by his insurance (not on formulary).  Let him know I sent a message to Dr. Dianah Field, but also gave him a list of weight loss medications so he could call his insurance to see if any were covered: Saxenda, Qsymia, Contrave, Mounjaro(let him know this was very unlikely), and Phentermine.  ? ?

## 2021-07-02 NOTE — Assessment & Plan Note (Signed)
Wegovy not covered, switching to [facility]. ?Patient will be on a multidisciplinary weight loss plan including calorie counting, exercise, he struggled with weight for years and Target weight for him is approximately 140 to 190 pounds. ?

## 2021-07-02 NOTE — Addendum Note (Signed)
Addended by: Silverio Decamp on: 07/02/2021 10:59 AM ? ? Modules accepted: Orders ? ?

## 2021-07-03 ENCOUNTER — Telehealth: Payer: Self-pay

## 2021-07-03 NOTE — Telephone Encounter (Addendum)
Initiated Prior authorization GMW:NUUVOZDG 2.5MG /0.5ML pen-injectors ?Via: Covermymeds ?Case/Key: [id number] ?Status: denied as of 07/03/21 ?Reason:plan exclusion ?Notified Pt via: Pt does not have Mychart, called pt to let him know about denial, pt vm box is not set up to leave a vm. ?

## 2021-07-07 NOTE — Telephone Encounter (Signed)
Well if he does not have type 2 diabetes and does not have MyChart or a voicemail box set up then that is on him, you did your best.  :-) ?

## 2021-07-16 ENCOUNTER — Ambulatory Visit (INDEPENDENT_AMBULATORY_CARE_PROVIDER_SITE_OTHER): Payer: Self-pay | Admitting: Sports Medicine

## 2021-07-16 ENCOUNTER — Other Ambulatory Visit: Payer: Self-pay

## 2021-07-16 VITALS — BP 144/77 | HR 67

## 2021-07-16 DIAGNOSIS — E291 Testicular hypofunction: Secondary | ICD-10-CM

## 2021-07-16 MED ORDER — LISINOPRIL-HYDROCHLOROTHIAZIDE 20-25 MG PO TABS: 1.0000 | ORAL_TABLET | Freq: Every day | ORAL | 1 refills | Status: DC

## 2021-07-16 MED ORDER — TESTOSTERONE CYPIONATE 200 MG/ML IM SOLN
200.0000 mg | Freq: Once | INTRAMUSCULAR | Status: AC
  Administered 2021-07-16: 200 mg via INTRAMUSCULAR

## 2021-07-16 MED ORDER — CARVEDILOL 25 MG PO TABS: 25.0000 mg | ORAL_TABLET | Freq: Two times a day (BID) | ORAL | 1 refills | Status: DC

## 2021-07-16 MED ORDER — AMLODIPINE BESYLATE 10 MG PO TABS: 10.0000 mg | ORAL_TABLET | Freq: Every day | ORAL | 3 refills | Status: DC

## 2021-07-16 NOTE — Progress Notes (Signed)
HPI:  Patient is here for a testosterone injection.  Denies chest pains, shortness of breath, headaches and problems with medication or mood changes.  Assessment and Plan:  Patient tolerated injection well without complications.  Patient advised to schedule next injection in 14 days.  T. Jd Mccaster, CMA (AAMA)  

## 2021-07-16 NOTE — Addendum Note (Signed)
Addended by: Fonnie Mu on: 07/16/2021 10:52 AM ? ? Modules accepted: Orders ? ?

## 2021-07-30 ENCOUNTER — Ambulatory Visit (INDEPENDENT_AMBULATORY_CARE_PROVIDER_SITE_OTHER): Payer: Self-pay | Admitting: Sports Medicine

## 2021-07-30 VITALS — BP 146/78 | HR 79

## 2021-07-30 DIAGNOSIS — E291 Testicular hypofunction: Secondary | ICD-10-CM

## 2021-07-30 MED ORDER — TESTOSTERONE CYPIONATE 200 MG/ML IM SOLN
200.0000 mg | Freq: Once | INTRAMUSCULAR | Status: AC
  Administered 2021-07-30: 200 mg via INTRAMUSCULAR

## 2021-07-30 NOTE — Progress Notes (Signed)
Patient is here for testosterone injection. Denies chest pain, shortness of breath, headaches, and problems with medication or mood changes.  ? ?Patient tolerated testosterone 200 mg injection to LUOQ well without complications. Patient advised to schedule next injection in 14 days.  ? ?

## 2021-08-11 ENCOUNTER — Ambulatory Visit (INDEPENDENT_AMBULATORY_CARE_PROVIDER_SITE_OTHER): Payer: Self-pay | Admitting: Sports Medicine

## 2021-08-11 VITALS — BP 139/75 | HR 96

## 2021-08-11 DIAGNOSIS — E291 Testicular hypofunction: Secondary | ICD-10-CM

## 2021-08-11 DIAGNOSIS — M5136 Other intervertebral disc degeneration, lumbar region: Secondary | ICD-10-CM

## 2021-08-11 MED ORDER — TRAMADOL HCL 50 MG PO TABS: ORAL_TABLET | ORAL | 0 refills | Status: DC

## 2021-08-11 MED ORDER — TESTOSTERONE CYPIONATE 200 MG/ML IM SOLN
200.0000 mg | Freq: Once | INTRAMUSCULAR | Status: AC
  Administered 2021-08-11: 200 mg via INTRAMUSCULAR

## 2021-08-11 MED ORDER — MELOXICAM 15 MG PO TABS: 15.0000 mg | ORAL_TABLET | Freq: Every day | ORAL | 3 refills | Status: DC

## 2021-08-11 NOTE — Progress Notes (Signed)
? ?Established Patient Office Visit ? ?Subjective:  ?Patient ID: Richard Beltran, male    DOB: [birth year]  Age: 56 y.o. MRN: [id number] ? ?CC:  ?Chief Complaint  ?Patient presents with  ? Hypogonadism  ? ? ?HPI ?Richard Beltran is here for a testosterone injection. Denies chest pain, shortness of breath, headaches or mood changes.   ? ?Past Medical History:  ?Diagnosis Date  ? Back pain   ? Hyperlipidemia   ? Hypertension   ? Hypertension   ? Joint pain   ? Lumbar degenerative disc disease 10/29/2013  ? Post L4-L5 fusion.   ? Male hypogonadism   ? Other fatigue   ? Shortness of breath on exertion   ? ? ?Past Surgical History:  ?Procedure Laterality Date  ? BACK SURGERY  2005  ? ? ?Family History  ?Problem Relation Age of Onset  ? Diabetes Mother   ? Hyperlipidemia Mother   ? Hypertension Mother   ? Healthy Father   ? Hypertension Father   ? Healthy Maternal Grandmother   ? Healthy Maternal Grandfather   ? Healthy Paternal Grandmother   ? Healthy Paternal Grandfather   ? Colon cancer Neg Hx   ? Colon polyps Neg Hx   ? Esophageal cancer Neg Hx   ? Rectal cancer Neg Hx   ? Stomach cancer Neg Hx   ? ? ?Social History  ? ?Socioeconomic History  ? Marital status: Married  ?  Spouse name: Richard Beltran  ? Number of children: 4  ? Years of education: 18+  ? Highest education level: Not on file  ?Occupational History  ? Occupation: Database administrator  ?Tobacco Use  ? Smoking status: Never  ? Smokeless tobacco: Never  ?Vaping Use  ? Vaping Use: Never used  ?Substance and Sexual Activity  ? Alcohol use: No  ? Drug use: No  ? Sexual activity: Yes  ?  Partners: Female  ?  Birth control/protection: None  ?Other Topics Concern  ? Not on file  ?Social History Narrative  ? Fun: Golf and read  ? Denies religious beliefs effecting healthcare.   ? ?Social Determinants of Health  ? ?Financial Resource Strain: Not on file  ?Food Insecurity: Not on file  ?Transportation Needs: Not on file  ?Physical Activity: Not on file  ?Stress: Not on file  ?Social  Connections: Not on file  ?Intimate Partner Violence: Not on file  ? ? ?Outpatient Medications Prior to Visit  ?Medication Sig Dispense Refill  ? amLODipine (NORVASC) 10 MG tablet Take 1 tablet (10 mg total) by mouth daily. 90 tablet 3  ? carvedilol (COREG) 25 MG tablet Take 1 tablet (25 mg total) by mouth 2 (two) times daily with a meal. 180 tablet 1  ? lisinopril-hydrochlorothiazide (ZESTORETIC) 20-25 MG tablet Take 1 tablet by mouth daily. 90 tablet 1  ? meloxicam (MOBIC) 15 MG tablet Take 1 tablet (15 mg total) by mouth daily. 90 tablet 1  ? testosterone cypionate (DEPOTESTOTERONE CYPIONATE) 100 MG/ML injection Inject 200 mg into the muscle every 14 (fourteen) days. Inject 45m (2039mtotal) into the muscle once every 14 days.    ? tirzepatide ([facility] MG/0.5ML Pen Inject 2.5 mg into the skin once a week. 2 mL 0  ? traMADol (ULTRAM) 50 MG tablet TAKE 1 TO 2 TABLETS BY MOUTH THREE TIMES DAILY AS NEEDED FOR PAIN 180 tablet 0  ? ?No facility-administered medications prior to visit.  ? ? ?Allergies  ?Allergen Reactions  ? Eggs Or Egg-Derived Products Nausea  And Vomiting  ? ? ?ROS ?Review of Systems ? ?  ?Objective:  ?  ?Physical Exam ? ?BP 139/75   Pulse 96   SpO2 98%  ?Wt Readings from Last 3 Encounters:  ?06/18/21 284 lb (128.8 kg)  ?01/16/21 282 lb (127.9 kg)  ?11/21/20 275 lb (124.7 kg)  ? ? ? ?There are no preventive care reminders to display for this patient. ? ?There are no preventive care reminders to display for this patient. ? ?Lab Results  ?Component Value Date  ? TSH 1.50 06/25/2021  ? ?Lab Results  ?Component Value Date  ? WBC 6.1 06/25/2021  ? HGB 15.5 06/25/2021  ? HCT 46.9 06/25/2021  ? MCV 89.2 06/25/2021  ? PLT 244 06/25/2021  ? ?Lab Results  ?Component Value Date  ? NA 139 06/25/2021  ? K 4.0 06/25/2021  ? CO2 30 06/25/2021  ? GLUCOSE 89 06/25/2021  ? BUN 16 06/25/2021  ? CREATININE 1.16 06/25/2021  ? BILITOT 0.8 06/25/2021  ? ALKPHOS 60 10/02/2020  ? AST 17 06/25/2021  ? ALT 22 06/25/2021   ? PROT 7.1 06/25/2021  ? ALBUMIN 4.2 10/02/2020  ? CALCIUM 9.5 06/25/2021  ? EGFR 67 10/02/2020  ? ?Lab Results  ?Component Value Date  ? CHOL 228 (H) 06/25/2021  ? ?Lab Results  ?Component Value Date  ? HDL 36 (L) 06/25/2021  ? ?Lab Results  ?Component Value Date  ? LDLCALC 165 (H) 06/25/2021  ? ?Lab Results  ?Component Value Date  ? TRIG 131 06/25/2021  ? ?Lab Results  ?Component Value Date  ? CHOLHDL 6.3 (H) 06/25/2021  ? ?Lab Results  ?Component Value Date  ? HGBA1C 5.0 06/25/2021  ? ? ?  ?Assessment & Plan:  ?Hypogonadism - Patient tolerated injection well without complications. Patient advised to schedule next injection 14 days from today.   ? ?He is requesting refills on Mobic and Tramadol.  ? ?Problem List Items Addressed This Visit   ? ? Lumbar degenerative disc disease  ? ?Other Visit Diagnoses   ? ? Hypogonadism in male    -  Primary  ? Relevant Medications  ? testosterone cypionate (DEPOTESTOSTERONE CYPIONATE) injection 200 mg (Completed) (Start on 08/11/2021 10:00 AM)  ? ?  ? ? ?Meds ordered this encounter  ?Medications  ? testosterone cypionate (DEPOTESTOSTERONE CYPIONATE) injection 200 mg  ? ? ?Follow-up: Return in about 2 weeks (around 08/25/2021) for testosterone injection. .  ? ? ?Richard Beltran ?

## 2021-08-11 NOTE — Progress Notes (Signed)
Chronic processes with exacerbation and pharmacologic intervention for his pain. ?

## 2021-08-25 ENCOUNTER — Ambulatory Visit (INDEPENDENT_AMBULATORY_CARE_PROVIDER_SITE_OTHER): Payer: Self-pay | Admitting: Sports Medicine

## 2021-08-25 VITALS — BP 133/76 | HR 74 | Ht 72.0 in | Wt 285.0 lb

## 2021-08-25 DIAGNOSIS — E291 Testicular hypofunction: Secondary | ICD-10-CM

## 2021-08-25 MED ORDER — TESTOSTERONE CYPIONATE 200 MG/ML IM SOLN
200.0000 mg | INTRAMUSCULAR | Status: DC
  Administered 2021-08-25 – 2021-09-08 (×2): 200 mg via INTRAMUSCULAR

## 2021-08-25 NOTE — Progress Notes (Signed)
Patient is here for a testosterone injection of '200mg'$ .  ?Given in [place]. ? ?Denies chest pain, shortness of breath, headaches and problems with medication or mood changes.  ?Tolerated injection well without complications.  ? ?Patient advised to schedule next injection in 14 days. ? ? ?Dr. Darene Lamer: Pt states his left knee continues to swell. Current diuretic is Lisinopril-HCTZ 20-'25mg'$  tab. Requesting medication to help with swelling until he can be seen. Please advise. ?

## 2021-08-27 ENCOUNTER — Ambulatory Visit (INDEPENDENT_AMBULATORY_CARE_PROVIDER_SITE_OTHER): Payer: Self-pay | Admitting: Sports Medicine

## 2021-08-27 ENCOUNTER — Telehealth: Payer: Self-pay | Admitting: Sports Medicine

## 2021-08-27 ENCOUNTER — Ambulatory Visit (INDEPENDENT_AMBULATORY_CARE_PROVIDER_SITE_OTHER): Payer: Self-pay

## 2021-08-27 DIAGNOSIS — M23204 Derangement of unspecified medial meniscus due to old tear or injury, left knee: Secondary | ICD-10-CM

## 2021-08-27 DIAGNOSIS — M1712 Unilateral primary osteoarthritis, left knee: Secondary | ICD-10-CM

## 2021-08-27 NOTE — Telephone Encounter (Signed)
[employer] paperwork faxed to [employer] at [phone] ?Request is for [facility] ?Fax confirmation receipt received  ?

## 2021-08-27 NOTE — Progress Notes (Signed)
? ? ?  Procedures performed today:   ? ?Procedure: Real-time Ultrasound Guided injection of the left knee ?Device: Samsung HS60  ?Verbal informed consent obtained.  ?Time-out conducted.  ?Noted no overlying erythema, induration, or other signs of local infection.  ?Skin prepped in a sterile fashion.  ?Local anesthesia: Topical Ethyl chloride.  ?With sterile technique and under real time ultrasound guidance: Mild effusion noted, 1 cc Kenalog 40, 2 cc lidocaine, 2 cc bupivacaine injected easily ?Completed without difficulty  ?Advised to call if fevers/chills, erythema, induration, drainage, or persistent bleeding.  ?Images permanently stored and available for review in PACS.  ?Impression: Technically successful ultrasound guided injection. ? ?Independent interpretation of notes and tests performed by another provider:  ? ?None. ? ?Brief History, Exam, Impression, and Recommendations:   ? ?Primary osteoarthritis of left knee ?Pleasant 56 year old male, chronic left knee pain, osteoarthritis on x-rays, last injection was in January, repeated today due to worsening pain. ?We will get him approved for viscosupplementation as injections are now only lasting barely 3 months. ? ?Chronic process with exacerbation and pharmacologic intervention ? ?___________________________________________ ?Gwen Her. Dianah Field, M.D., ABFM., CAQSM. ?Primary Care and Sports Medicine ?[place] ? ?Adjunct Instructor of Family Medicine  ?[employer] ?

## 2021-08-27 NOTE — Assessment & Plan Note (Signed)
Pleasant 56 year old male, chronic left knee pain, osteoarthritis on x-rays, last injection was in January, repeated today due to worsening pain. ?We will get him approved for viscosupplementation as injections are now only lasting barely 3 months. ?

## 2021-08-27 NOTE — Telephone Encounter (Signed)
[facility] approval please, left knee, x-ray confirmed osteoarthritis and has already had NSAIDs, greater than 6 weeks of physician directed conservative treatment, steroid injections.  Left knee only. ?

## 2021-09-01 ENCOUNTER — Ambulatory Visit: Payer: Self-pay | Admitting: Sports Medicine

## 2021-09-08 ENCOUNTER — Ambulatory Visit (INDEPENDENT_AMBULATORY_CARE_PROVIDER_SITE_OTHER): Payer: Self-pay | Admitting: Sports Medicine

## 2021-09-08 VITALS — BP 140/83 | HR 78

## 2021-09-08 DIAGNOSIS — E291 Testicular hypofunction: Secondary | ICD-10-CM

## 2021-09-08 NOTE — Progress Notes (Signed)
? ?  Established Patient Office Visit ? ?Subjective   ?Patient ID: Richard Beltran, male    DOB: August 19, 1965  Age: 56 y.o. MRN: [id number] ? ?Chief Complaint  ?Patient presents with  ? Hypogonadism  ? ? ?HPI ? ?Richard Beltran is here for a testosterone injection. Denies chest pain, shortness of breath, headaches or mood changes.  ? ?ROS ? ?  ?Objective:  ?  ? ?BP 140/83   Pulse 78   SpO2 96%  ? ? ?Physical Exam ? ? ?No results found for any visits on 09/08/21. ? ? ? ?The 10-year ASCVD risk score (Arnett DK, et al., 2019) is: 14.7% ? ?  ?Assessment & Plan:  ?Hypogonadism - Patient tolerated injection well without complications. Patient advised to schedule next injection 14 days from today.   ? ?Problem List Items Addressed This Visit   ?None ?Visit Diagnoses   ? ? Hypogonadism in male    -  Primary  ? ?  ? ? ?Return in about 2 weeks (around 09/22/2021) for testosterone injection. .  ? ? ?Lavell Luster, [place] ? ?

## 2021-09-18 NOTE — Telephone Encounter (Signed)
Since patient is self pay, [facility] has sent his info to a special program to see about assistance with the cost.

## 2021-09-22 ENCOUNTER — Ambulatory Visit (INDEPENDENT_AMBULATORY_CARE_PROVIDER_SITE_OTHER): Payer: Self-pay | Admitting: Sports Medicine

## 2021-09-22 VITALS — BP 152/81 | HR 76

## 2021-09-22 DIAGNOSIS — E291 Testicular hypofunction: Secondary | ICD-10-CM

## 2021-09-22 MED ORDER — TESTOSTERONE CYPIONATE 200 MG/ML IM SOLN
200.0000 mg | Freq: Once | INTRAMUSCULAR | Status: AC
  Administered 2021-09-22: 200 mg via INTRAMUSCULAR

## 2021-09-22 NOTE — Progress Notes (Signed)
Patient is here for testosterone injection. Denies chest pain, shortness of breath, headaches, and problems with medication or mood changes.   Patient tolerated testosterone 200 mg injection to LUOQ well without complications. Patient advised to schedule next injection in 14 days.

## 2021-09-29 ENCOUNTER — Other Ambulatory Visit: Payer: Self-pay | Admitting: Sports Medicine

## 2021-09-29 DIAGNOSIS — M5136 Other intervertebral disc degeneration, lumbar region: Secondary | ICD-10-CM

## 2021-10-02 NOTE — Telephone Encounter (Signed)
Patient called and stated that the medication had been shipped to his home and he has been scheduled.

## 2021-10-05 ENCOUNTER — Ambulatory Visit (INDEPENDENT_AMBULATORY_CARE_PROVIDER_SITE_OTHER): Payer: Self-pay

## 2021-10-05 ENCOUNTER — Ambulatory Visit (INDEPENDENT_AMBULATORY_CARE_PROVIDER_SITE_OTHER): Payer: Self-pay | Admitting: Sports Medicine

## 2021-10-05 DIAGNOSIS — M1712 Unilateral primary osteoarthritis, left knee: Secondary | ICD-10-CM

## 2021-10-05 NOTE — Assessment & Plan Note (Signed)
Orthovisc No. 1 of 4 left knee, return in 1 week for #2 of 4.

## 2021-10-05 NOTE — Progress Notes (Signed)
    Procedures performed today:    Procedure: Real-time Ultrasound Guided injection of the left knee Device: Samsung HS60  Verbal informed consent obtained.  Time-out conducted.  Noted no overlying erythema, induration, or other signs of local infection.  Skin prepped in a sterile fashion.  Local anesthesia: Topical Ethyl chloride.  With sterile technique and under real time ultrasound guidance: 30 mg/2 mL of OrthoVisc (sodium hyaluronate) in a prefilled syringe was injected easily into the knee through a 22-gauge needle. Completed without difficulty  Advised to call if fevers/chills, erythema, induration, drainage, or persistent bleeding.  Images permanently stored and available for review in PACS.  Impression: Technically successful ultrasound guided injection.    Independent interpretation of notes and tests performed by another provider:   None.  Brief History, Exam, Impression, and Recommendations:    Primary osteoarthritis of left knee Orthovisc No. 1 of 4 left knee, return in 1 week for #2 of 4.    ___________________________________________ Gwen Her. Dianah Field, M.D., ABFM., CAQSM. Primary Care and [place] Instructor of [place] of [facility] of Medicine

## 2021-10-06 ENCOUNTER — Ambulatory Visit (INDEPENDENT_AMBULATORY_CARE_PROVIDER_SITE_OTHER): Payer: Self-pay | Admitting: Sports Medicine

## 2021-10-06 DIAGNOSIS — E291 Testicular hypofunction: Secondary | ICD-10-CM

## 2021-10-06 MED ORDER — TESTOSTERONE CYPIONATE 200 MG/ML IM SOLN
200.0000 mg | INTRAMUSCULAR | Status: DC
  Administered 2021-10-06 – 2021-12-08 (×3): 200 mg via INTRAMUSCULAR

## 2021-10-06 NOTE — Progress Notes (Signed)
HPI:  Patient is here for a testosterone injection.  Denies chest pains, shortness of breath, headaches and problems with medication or mood changes.  Assessment and Plan:  Patient tolerated injection well without complications.  Patient advised to schedule next injection in 14 days.  T. Halton Neas, CMA (AAMA)  

## 2021-10-09 ENCOUNTER — Ambulatory Visit (INDEPENDENT_AMBULATORY_CARE_PROVIDER_SITE_OTHER): Payer: Self-pay | Admitting: Sports Medicine

## 2021-10-09 ENCOUNTER — Ambulatory Visit (INDEPENDENT_AMBULATORY_CARE_PROVIDER_SITE_OTHER): Payer: Self-pay

## 2021-10-09 DIAGNOSIS — M1712 Unilateral primary osteoarthritis, left knee: Secondary | ICD-10-CM

## 2021-10-09 NOTE — Progress Notes (Signed)
    Procedures performed today:    Procedure: Real-time Ultrasound Guided injection of the left knee Device: Samsung HS60  Verbal informed consent obtained.  Time-out conducted.  Noted no overlying erythema, induration, or other signs of local infection.  Skin prepped in a sterile fashion.  Local anesthesia: Topical Ethyl chloride.  With sterile technique and under real time ultrasound guidance: 30 mg/2 mL of OrthoVisc (sodium hyaluronate) in a prefilled syringe was injected easily into the knee through a 22-gauge needle. Completed without difficulty  Advised to call if fevers/chills, erythema, induration, drainage, or persistent bleeding.  Images permanently stored and available for review in PACS.  Impression: Technically successful ultrasound guided injection.   Independent interpretation of notes and tests performed by another provider:   None.  Brief History, Exam, Impression, and Recommendations:    Primary osteoarthritis of left knee Orthovisc 2 of 4 left knee, return in 1 week for #3 of 4.    ___________________________________________ Gwen Her. Dianah Field, M.D., ABFM., CAQSM. Primary Care and [place] Instructor of [place] of [facility] of Medicine

## 2021-10-09 NOTE — Assessment & Plan Note (Signed)
Orthovisc 2 of 4 left knee, return in 1 week for #3 of 4 

## 2021-10-20 ENCOUNTER — Ambulatory Visit (INDEPENDENT_AMBULATORY_CARE_PROVIDER_SITE_OTHER): Payer: Self-pay | Admitting: Sports Medicine

## 2021-10-20 ENCOUNTER — Ambulatory Visit (INDEPENDENT_AMBULATORY_CARE_PROVIDER_SITE_OTHER): Payer: Self-pay

## 2021-10-20 VITALS — BP 123/75 | HR 63 | Resp 20

## 2021-10-20 DIAGNOSIS — M1712 Unilateral primary osteoarthritis, left knee: Secondary | ICD-10-CM

## 2021-10-20 DIAGNOSIS — E291 Testicular hypofunction: Secondary | ICD-10-CM

## 2021-10-20 NOTE — Assessment & Plan Note (Signed)
Orthovisc 3 of 4 left knee, return in 1 week for #4 of 4. 

## 2021-10-20 NOTE — Progress Notes (Signed)
   Subjective:    Patient ID: Richard Beltran, male    DOB: 1966/04/08, 56 y.o.   MRN: [id number]  HPI  Patient is here for a testosterone injection. Denies chest pain, shortness of breath, headaches and problems with medication or mood changes.  Review of Systems     Objective:   Physical Exam        Assessment & Plan:   Patient tolerated injection in Left Upper Outer Quadrant well without complications. Patient advised to schedule next injection in 14 days.

## 2021-10-20 NOTE — Progress Notes (Signed)
    Procedures performed today:    Procedure: Real-time Ultrasound Guided injection of the left knee Device: Samsung HS60  Verbal informed consent obtained.  Time-out conducted.  Noted no overlying erythema, induration, or other signs of local infection.  Skin prepped in a sterile fashion.  Local anesthesia: Topical Ethyl chloride.  With sterile technique and under real time ultrasound guidance: 30 mg/2 mL of OrthoVisc (sodium hyaluronate) in a prefilled syringe was injected easily into the knee through a 22-gauge needle. Completed without difficulty  Advised to call if fevers/chills, erythema, induration, drainage, or persistent bleeding.  Images permanently stored and available for review in PACS.  Impression: Technically successful ultrasound guided injection.   Independent interpretation of notes and tests performed by another provider:   None.  Brief History, Exam, Impression, and Recommendations:    Primary osteoarthritis of left knee Orthovisc 3 of 4 left knee, return in 1 week for #4 of 4.    ___________________________________________ Gwen Her. Dianah Field, M.D., ABFM., CAQSM. Primary Care and [place] Instructor of [place] of [facility] of Medicine

## 2021-10-22 ENCOUNTER — Ambulatory Visit: Payer: Self-pay | Admitting: Sports Medicine

## 2021-10-26 ENCOUNTER — Ambulatory Visit (INDEPENDENT_AMBULATORY_CARE_PROVIDER_SITE_OTHER): Payer: Self-pay

## 2021-10-26 ENCOUNTER — Ambulatory Visit (INDEPENDENT_AMBULATORY_CARE_PROVIDER_SITE_OTHER): Payer: Self-pay | Admitting: Sports Medicine

## 2021-10-26 DIAGNOSIS — M1712 Unilateral primary osteoarthritis, left knee: Secondary | ICD-10-CM

## 2021-10-26 NOTE — Progress Notes (Signed)
    Procedures performed today:    Procedure: Real-time Ultrasound Guided injection of the left knee Device: Samsung HS60  Verbal informed consent obtained.  Time-out conducted.  Noted no overlying erythema, induration, or other signs of local infection.  Skin prepped in a sterile fashion.  Local anesthesia: Topical Ethyl chloride.  With sterile technique and under real time ultrasound guidance: 30 mg/2 mL of OrthoVisc (sodium hyaluronate) in a prefilled syringe was injected easily into the knee through a 22-gauge needle. Completed without difficulty  Advised to call if fevers/chills, erythema, induration, drainage, or persistent bleeding.  Images permanently stored and available for review in PACS.  Impression: Technically successful ultrasound guided injection.  Independent interpretation of notes and tests performed by another provider:   None.  Brief History, Exam, Impression, and Recommendations:    Primary osteoarthritis of left knee Orthovisc 4 of 4 left knee, return as needed.    ___________________________________________ Debby PARAS. Curtis, M.D., ABFM., CAQSM. Primary Care and Sports Medicine [place] [facility]  Adjunct Instructor of Family Medicine  [employer]

## 2021-10-26 NOTE — Assessment & Plan Note (Signed)
Orthovisc 4 of 4 left knee, return as needed. 

## 2021-10-30 ENCOUNTER — Ambulatory Visit (INDEPENDENT_AMBULATORY_CARE_PROVIDER_SITE_OTHER): Payer: Self-pay | Admitting: Physician Assistant

## 2021-10-30 ENCOUNTER — Encounter: Payer: Self-pay | Admitting: Physician Assistant

## 2021-10-30 ENCOUNTER — Other Ambulatory Visit: Payer: Self-pay | Admitting: Physician Assistant

## 2021-10-30 VITALS — BP 138/84 | HR 68 | Ht 75.0 in | Wt 284.0 lb

## 2021-10-30 DIAGNOSIS — J209 Acute bronchitis, unspecified: Secondary | ICD-10-CM

## 2021-10-30 MED ORDER — BENZONATATE 200 MG PO CAPS: 200.0000 mg | ORAL_CAPSULE | Freq: Three times a day (TID) | ORAL | 0 refills | Status: DC | PRN

## 2021-10-30 MED ORDER — HYDROCOD POLI-CHLORPHE POLI ER 10-8 MG/5ML PO SUER: 5.0000 mL | Freq: Two times a day (BID) | ORAL | 0 refills | Status: DC | PRN

## 2021-10-30 MED ORDER — ALBUTEROL SULFATE HFA 108 (90 BASE) MCG/ACT IN AERS: 2.0000 | INHALATION_SPRAY | RESPIRATORY_TRACT | 0 refills | Status: DC | PRN

## 2021-10-30 MED ORDER — PREDNISONE 50 MG PO TABS: ORAL_TABLET | ORAL | 0 refills | Status: DC

## 2021-10-30 NOTE — Patient Instructions (Signed)
  Acute Bronchitis, Adult  Acute bronchitis is when air tubes in the lungs (bronchi) suddenly get swollen. The condition can make it hard for you to breathe. In adults, acute bronchitis usually goes away within 2 weeks. A cough caused by bronchitis may last up to 3 weeks. Smoking, allergies, and asthma can make the condition worse. What are the causes? Germs that cause cold and flu (viruses). The most common cause of this condition is the virus that causes the common cold. Bacteria. Substances that bother (irritate) the lungs, including: Smoke from cigarettes and other types of tobacco. Dust and pollen. Fumes from chemicals, gases, or burned fuel. Indoor or outdoor air pollution. What increases the risk? A weak body's defense system. This is also called the immune system. Any condition that affects your lungs and breathing, such as asthma. What are the signs or symptoms? A cough. Coughing up clear, yellow, or green mucus. Making high-pitched whistling sounds when you breathe, most often when you breathe out (wheezing). Runny or stuffy nose. Having too much mucus in your lungs (chest congestion). Shortness of breath. Body aches. A sore throat. How is this treated? Acute bronchitis may go away over time without treatment. Your doctor may tell you to: Drink more fluids. This will help thin your mucus so it is easier to cough up. Use a device that gets medicine into your lungs (inhaler). Use a vaporizer or a humidifier. These are machines that add water to the air. This helps with coughing and poor breathing. Take a medicine that thins mucus and helps clear it from your lungs. Take a medicine that prevents or stops coughing. It is not common to take an antibiotic medicine for this condition. Follow these instructions at home:  Take over-the-counter and prescription medicines only as told by your doctor. Use an inhaler, vaporizer, or humidifier as told by your doctor. Take two  teaspoons (10 mL) of honey at bedtime. This helps lessen your coughing at night. Drink enough fluid to keep your pee (urine) pale yellow. Do not smoke or use any products that contain nicotine or tobacco. If you need help quitting, ask your doctor. Get a lot of rest. Return to your normal activities when your doctor says that it is safe. Keep all follow-up visits. How is this prevented?  Wash your hands often with soap and water for at least 20 seconds. If you cannot use soap and water, use hand sanitizer. Avoid contact with people who have cold symptoms. Try not to touch your mouth, nose, or eyes with your hands. Avoid breathing in smoke or chemical fumes. Make sure to get the flu shot every year. Contact a doctor if: Your symptoms do not get better in 2 weeks. You have trouble coughing up the mucus. Your cough keeps you awake at night. You have a fever. Get help right away if: You cough up blood. You have chest pain. You have very bad shortness of breath. You faint or keep feeling like you are going to faint. You have a very bad headache. Your fever or chills get worse. These symptoms may be an emergency. Get help right away. Call your local emergency services (911 in the [place] Do not wait to see if the symptoms will go away. Do not drive yourself to the hospital. Summary Acute bronchitis is when air tubes in the lungs (bronchi) suddenly get swollen. In adults, acute bronchitis usually goes away within 2 weeks. Drink more fluids. This will help thin your mucus so it   is easier to cough up. Take over-the-counter and prescription medicines only as told by your doctor. Contact a doctor if your symptoms do not improve after 2 weeks of treatment. This information is not intended to replace advice given to you by your health care provider. Make sure you discuss any questions you have with your health care provider. Document Revised: 08/20/2020 Document Reviewed: 08/20/2020 Elsevier  Patient Education  2023 Elsevier Inc.  

## 2021-10-30 NOTE — Telephone Encounter (Signed)
Can you sign for 70 ml? See note from pharmacy.

## 2021-10-30 NOTE — Progress Notes (Signed)
Acute Office Visit  Subjective:     Patient ID: Richard Beltran, male    DOB: 1966/02/20, 56 y.o.   MRN: [id number]  Chief Complaint  Patient presents with   Cough    Dry cough 2-3 days    HPI Patient is in today for dry cough for the last 3 days. Pt has OsA and HTN with no history of allergies or asthma. No sick contacts. Denies any fever, chills, body aches, sinus pressure, ear pain, dizziness. Cough is not productive. He has tried nyquil and dayquil. Cough kept him up last night. He is gargling with salt water.   .. Active Ambulatory Problems    Diagnosis Date Noted   Hypertension, essential, benign 10/29/2013   Lumbar degenerative disc disease 10/29/2013   Pulmonary nodule 10/29/2013   Annual physical exam 10/29/2013   Adhesive capsulitis of right shoulder 10/29/2013   Male hypogonadism 11/06/2013   Obstructive sleep apnea 10/22/2014   Obesity 11/18/2014   Insomnia 07/29/2015   Plantar fasciitis, left 04/10/2018   Sciatica associated with disorder of lumbar spine 07/16/2019   Primary osteoarthritis of left knee 10/14/2020   Resolved Ambulatory Problems    Diagnosis Date Noted   Adhesive capsulitis of right shoulder 09/20/2013   Viral syndrome 05/02/2015   History of 2019 novel coronavirus disease (COVID-19) 12/18/2018   Past Medical History:  Diagnosis Date   Back pain    Hyperlipidemia    Hypertension    Hypertension    Joint pain    Other fatigue    Shortness of breath on exertion     ROS See HPI.      Objective:    BP 138/84   Pulse 68   Ht '6\' 3"'$  (1.905 m)   Wt 284 lb (128.8 kg)   SpO2 99%   BMI 35.50 kg/m  BP Readings from Last 3 Encounters:  10/30/21 138/84  10/20/21 123/75  09/22/21 (!) 152/81   Wt Readings from Last 3 Encounters:  10/30/21 284 lb (128.8 kg)  08/25/21 285 lb (129.3 kg)  06/18/21 284 lb (128.8 kg)      Physical Exam Constitutional:      Appearance: Normal appearance. He is obese.  HENT:     Head:  Normocephalic.     Right Ear: Tympanic membrane, ear canal and external ear normal.     Left Ear: Tympanic membrane, ear canal and external ear normal.     Nose: Nose normal. No congestion or rhinorrhea.     Mouth/Throat:     Mouth: Mucous membranes are moist.     Pharynx: No oropharyngeal exudate or posterior oropharyngeal erythema.     Comments: Poor dentition Eyes:     Extraocular Movements: Extraocular movements intact.     Pupils: Pupils are equal, round, and reactive to light.     Comments: Slightly injected conjunctiva, bilateral.   Cardiovascular:     Rate and Rhythm: Normal rate and regular rhythm.  Pulmonary:     Effort: Pulmonary effort is normal.     Breath sounds: Normal breath sounds. No wheezing or rhonchi.  Musculoskeletal:     Right lower leg: No edema.     Left lower leg: No edema.  Lymphadenopathy:     Cervical: No cervical adenopathy.  Neurological:     General: No focal deficit present.     Mental Status: He is alert and oriented to person, place, and time.  Psychiatric:        Mood and Affect: Mood  normal.          Assessment & Plan:   Marland KitchenMarland KitchenTravelle was seen today for cough.  Diagnoses and all orders for this visit:  Acute bronchitis, unspecified organism -     albuterol (VENTOLIN HFA) 108 (90 Base) MCG/ACT inhaler; Inhale 2 puffs into the lungs every 4 (four) hours as needed. -     predniSONE (DELTASONE) 50 MG tablet; Take one tablet daily for 5 days. -     chlorpheniramine-HYDROcodone (TUSSIONEX PENNKINETIC ER) 10-8 MG/5ML; Take 5 mLs by mouth every 12 (twelve) hours as needed for cough. -     benzonatate (TESSALON) 200 MG capsule; Take 1 capsule (200 mg total) by mouth 3 (three) times daily as needed.   No signs of bacterial infection  Prednisone and albuterol to use Tussionex for at night and tessalon for cough during the day Follow up as needed or if symptoms persist.   Iran Planas, PA-C

## 2021-10-30 NOTE — Progress Notes (Signed)
ugh

## 2021-11-02 ENCOUNTER — Ambulatory Visit: Payer: Self-pay | Admitting: Sports Medicine

## 2021-11-02 ENCOUNTER — Other Ambulatory Visit: Payer: Self-pay | Admitting: Neurology

## 2021-11-02 NOTE — Telephone Encounter (Signed)
Patient was seen Friday for bronchitis, given cough medication. He states it is working well and asking for a refill. Richard Beltran not available today. Please advise.

## 2021-11-03 MED ORDER — HYDROCOD POLI-CHLORPHE POLI ER 10-8 MG/5ML PO SUER: ORAL | 0 refills | Status: DC

## 2021-11-03 NOTE — Telephone Encounter (Signed)
Happy to refill 1 more time

## 2021-11-04 ENCOUNTER — Ambulatory Visit: Payer: Self-pay

## 2021-11-06 ENCOUNTER — Ambulatory Visit (INDEPENDENT_AMBULATORY_CARE_PROVIDER_SITE_OTHER): Payer: Self-pay | Admitting: Sports Medicine

## 2021-11-06 ENCOUNTER — Other Ambulatory Visit: Payer: Self-pay | Admitting: Physician Assistant

## 2021-11-06 ENCOUNTER — Other Ambulatory Visit: Payer: Self-pay | Admitting: Sports Medicine

## 2021-11-06 VITALS — BP 144/77 | HR 64

## 2021-11-06 DIAGNOSIS — J209 Acute bronchitis, unspecified: Secondary | ICD-10-CM

## 2021-11-06 DIAGNOSIS — E291 Testicular hypofunction: Secondary | ICD-10-CM

## 2021-11-06 DIAGNOSIS — M5136 Other intervertebral disc degeneration, lumbar region: Secondary | ICD-10-CM

## 2021-11-06 MED ORDER — TESTOSTERONE CYPIONATE 200 MG/ML IM SOLN
200.0000 mg | Freq: Once | INTRAMUSCULAR | Status: AC
  Administered 2021-11-06: 200 mg via INTRAMUSCULAR

## 2021-11-06 NOTE — Progress Notes (Signed)
Pt here for testosterone injection no SOB,CP or mood swings. Injection tolerated well given in RUOQ. Patient will RTC in 2 weeks for next injection.

## 2021-11-24 ENCOUNTER — Ambulatory Visit (INDEPENDENT_AMBULATORY_CARE_PROVIDER_SITE_OTHER): Payer: Self-pay | Admitting: Sports Medicine

## 2021-11-24 VITALS — BP 131/83 | HR 66 | Temp 97.6°F | Ht 73.0 in | Wt 289.0 lb

## 2021-11-24 DIAGNOSIS — E291 Testicular hypofunction: Secondary | ICD-10-CM

## 2021-11-24 MED ORDER — TESTOSTERONE CYPIONATE 200 MG/ML IM SOLN
200.0000 mg | Freq: Once | INTRAMUSCULAR | Status: DC
  Administered 2021-11-24: 200 mg via INTRAMUSCULAR

## 2021-11-24 MED ORDER — TESTOSTERONE CYPIONATE 200 MG/ML IM SOLN: 200.0000 mg | Freq: Once | INTRAMUSCULAR | Status: DC

## 2021-11-24 MED ORDER — TESTOSTERONE CYPIONATE 100 MG/ML IM SOLN: 200.0000 mg | Freq: Once | INTRAMUSCULAR | Status: DC

## 2021-11-24 NOTE — Progress Notes (Signed)
Patient is here for a testosterone injection. Denies chest pain, shortness of breath, headaches and problems with medication or mood changes.   Patient tolerated injection on LUOQ well without complications. Patient advised to schedule next injection in 14 days.

## 2021-12-08 ENCOUNTER — Ambulatory Visit (INDEPENDENT_AMBULATORY_CARE_PROVIDER_SITE_OTHER): Payer: Self-pay | Admitting: Sports Medicine

## 2021-12-08 VITALS — BP 126/72 | HR 77

## 2021-12-08 DIAGNOSIS — E291 Testicular hypofunction: Secondary | ICD-10-CM

## 2021-12-08 NOTE — Progress Notes (Signed)
   Established Patient Office Visit  Subjective   Patient ID: Richard Beltran, male    DOB: 11/22/1965  Age: 56 y.o. MRN: [id number]  Chief Complaint  Patient presents with   Hypogonadism    HPI  Richard Beltran is here for a testosterone injection. Denies chest pain, shortness of breath, headaches or mood changes.   ROS    Objective:     BP 126/72   Pulse 77   SpO2 96%    Physical Exam   No results found for any visits on 12/08/21.    The 10-year ASCVD risk score (Arnett DK, et al., 2019) is: 12.7%    Assessment & Plan:  Hypogonadism - Patient tolerated injection well without complications. Patient advised to schedule next injection 14 days from today.    Problem List Items Addressed This Visit       Unprioritized   Male hypogonadism - Primary    Return in about 2 weeks (around 12/22/2021) for testosterone injection. Durene Romans, Monico Blitz, [place]

## 2021-12-22 ENCOUNTER — Ambulatory Visit (INDEPENDENT_AMBULATORY_CARE_PROVIDER_SITE_OTHER): Payer: Self-pay | Admitting: Sports Medicine

## 2021-12-22 ENCOUNTER — Other Ambulatory Visit: Payer: Self-pay

## 2021-12-22 VITALS — BP 114/73 | HR 67

## 2021-12-22 DIAGNOSIS — E291 Testicular hypofunction: Secondary | ICD-10-CM

## 2021-12-22 DIAGNOSIS — M5136 Other intervertebral disc degeneration, lumbar region: Secondary | ICD-10-CM

## 2021-12-22 MED ORDER — TRAMADOL HCL 50 MG PO TABS: ORAL_TABLET | ORAL | 0 refills | Status: DC

## 2021-12-22 MED ORDER — TESTOSTERONE CYPIONATE 200 MG/ML IM SOLN
200.0000 mg | INTRAMUSCULAR | Status: DC
  Administered 2021-12-22 – 2022-04-20 (×8): 200 mg via INTRAMUSCULAR

## 2021-12-22 NOTE — Progress Notes (Signed)
HPI:  Patient is here for a testosterone injection.  Denies chest pains, shortness of breath, headaches and problems with medication or mood changes.  Assessment and Plan:  Patient tolerated injection well without complications.  Patient advised to schedule next injection in 14 days.  Charyl Bigger, CMA (AAMA)

## 2022-01-05 ENCOUNTER — Ambulatory Visit (INDEPENDENT_AMBULATORY_CARE_PROVIDER_SITE_OTHER): Payer: Self-pay | Admitting: Sports Medicine

## 2022-01-05 VITALS — BP 139/68 | HR 73

## 2022-01-05 DIAGNOSIS — E291 Testicular hypofunction: Secondary | ICD-10-CM

## 2022-01-05 NOTE — Progress Notes (Signed)
   Established Patient Office Visit  Subjective   Patient ID: Richard Beltran, male    DOB: January 20, 1966  Age: 56 y.o. MRN: [id number]  Chief Complaint  Patient presents with   Hypogonadism    HPI  Richard Beltran is here for a testosterone injection. Denies chest pain, shortness of breath, headaches or mood changes.   ROS    Objective:     BP 139/68   Pulse 73   SpO2 98%    Physical Exam   No results found for any visits on 01/05/22.    The 10-year ASCVD risk score (Arnett DK, et al., 2019) is: 15.1%    Assessment & Plan:  Testosterone injection - Patient tolerated injection well without complications. Patient advised to schedule next injection 14 days from today.     Problem List Items Addressed This Visit       Unprioritized   Male hypogonadism - Primary    Return in about 2 weeks (around 01/19/2022) for testosterone injection. Durene Romans, Monico Blitz, [place]

## 2022-01-19 ENCOUNTER — Ambulatory Visit (INDEPENDENT_AMBULATORY_CARE_PROVIDER_SITE_OTHER): Payer: Self-pay | Admitting: Sports Medicine

## 2022-01-19 DIAGNOSIS — E291 Testicular hypofunction: Secondary | ICD-10-CM

## 2022-01-19 NOTE — Progress Notes (Signed)
   Established Patient Office Visit  Subjective   Patient ID: Richard Beltran, male    DOB: 1965/09/11  Age: 56 y.o. MRN: [id number]  Chief Complaint  Patient presents with   Hypogonadism    HPI  Patient here for testosterone injection. Denies chest pain, shortness of breath or headache.   ROS    Objective:     BP 125/77   Pulse 71   SpO2 98%    Physical Exam   No results found for any visits on 01/19/22.    The 10-year ASCVD risk score (Arnett DK, et al., 2019) is: 12.5%    Assessment & Plan:  Testosterone injection. Patient tolerated injection without complications.  Problem List Items Addressed This Visit       Endocrine   Male hypogonadism    Return in about 2 weeks (around 02/02/2022) for testosterone injection.    Rae Lips, LPN

## 2022-02-01 ENCOUNTER — Other Ambulatory Visit: Payer: Self-pay | Admitting: Sports Medicine

## 2022-02-01 DIAGNOSIS — M5136 Other intervertebral disc degeneration, lumbar region: Secondary | ICD-10-CM

## 2022-02-02 ENCOUNTER — Ambulatory Visit (INDEPENDENT_AMBULATORY_CARE_PROVIDER_SITE_OTHER): Payer: Self-pay | Admitting: Sports Medicine

## 2022-02-02 VITALS — BP 134/80 | HR 69

## 2022-02-02 DIAGNOSIS — E291 Testicular hypofunction: Secondary | ICD-10-CM

## 2022-02-02 MED ORDER — TESTOSTERONE CYPIONATE 200 MG/ML IM SOLN
200.0000 mg | Freq: Once | INTRAMUSCULAR | Status: AC
  Administered 2022-02-02: 200 mg via INTRAMUSCULAR

## 2022-02-02 NOTE — Progress Notes (Signed)
Pt here for testosterone injection no SOB,CP or mood swings. Injection tolerated well given in RUOQ. Pt yo rtc in 2 weeks

## 2022-02-16 ENCOUNTER — Ambulatory Visit: Payer: Self-pay

## 2022-02-25 ENCOUNTER — Ambulatory Visit (INDEPENDENT_AMBULATORY_CARE_PROVIDER_SITE_OTHER): Payer: Self-pay | Admitting: Sports Medicine

## 2022-02-25 DIAGNOSIS — E291 Testicular hypofunction: Secondary | ICD-10-CM

## 2022-02-25 NOTE — Patient Instructions (Signed)
Return in 2 weeks for next testosterone injection.

## 2022-02-25 NOTE — Progress Notes (Signed)
   Established Patient Office Visit  Subjective   Patient ID: Richard Beltran, male    DOB: Jun 12, 1965  Age: 56 y.o. MRN: [id number]  Chief Complaint  Patient presents with   testosterone injection    Patient denies  shortness of breath, mood changes, chest pain or any medication problems.     HPI  Testosterone injection- pt denies shortness of breath, mood changes, chest pain or any medication problems.   ROS    Objective:     BP 137/71   Pulse 61   Ht '6\' 1"'$  (1.854 m)   SpO2 98%   BMI 38.13 kg/m    Physical Exam   No results found for any visits on 02/25/22.    The 10-year ASCVD risk score (Arnett DK, et al., 2019) is: 14.7%    Assessment & Plan:  Testosterone injection.-patient tolerated injection well without complications. Patient will return in 2 weeks for next testosterone injection. Problem List Items Addressed This Visit       Endocrine   Male hypogonadism    Return in about 2 weeks (around 03/11/2022) for nurse visit - testosterone injection.    Rae Lips, LPN

## 2022-03-08 ENCOUNTER — Telehealth: Payer: Self-pay | Admitting: Sports Medicine

## 2022-03-08 DIAGNOSIS — M5136 Other intervertebral disc degeneration, lumbar region: Secondary | ICD-10-CM

## 2022-03-08 DIAGNOSIS — M51369 Other intervertebral disc degeneration, lumbar region without mention of lumbar back pain or lower extremity pain: Secondary | ICD-10-CM

## 2022-03-08 MED ORDER — LISINOPRIL-HYDROCHLOROTHIAZIDE 20-25 MG PO TABS
1.0000 | ORAL_TABLET | Freq: Every day | ORAL | 1 refills | Status: DC
Start: 2022-03-08 — End: 2022-09-24

## 2022-03-08 MED ORDER — TRAMADOL HCL 50 MG PO TABS: 100.0000 mg | ORAL_TABLET | Freq: Two times a day (BID) | ORAL | 0 refills | Status: DC

## 2022-03-08 NOTE — Telephone Encounter (Signed)
Sent!

## 2022-03-08 NOTE — Telephone Encounter (Signed)
Patient called needs refills on Tramadol and Lisinpril  used [facility] [address] in [place]

## 2022-03-11 ENCOUNTER — Ambulatory Visit (INDEPENDENT_AMBULATORY_CARE_PROVIDER_SITE_OTHER): Payer: Self-pay | Admitting: Sports Medicine

## 2022-03-11 VITALS — BP 140/83 | HR 64

## 2022-03-11 DIAGNOSIS — E291 Testicular hypofunction: Secondary | ICD-10-CM

## 2022-03-11 MED ORDER — WEGOVY 0.25 MG/0.5ML ~~LOC~~ SOAJ
0.2500 mg | SUBCUTANEOUS | 0 refills | Status: DC
Start: 2022-03-11 — End: 2023-02-04

## 2022-03-11 NOTE — Progress Notes (Signed)
Patient is here for testosterone injection. Denies chest pain, shortness of breath, headaches, and problems with medication or mood changes.   Patient tolerated testosterone 200 mg injection to RUOQ well without complications. Patient advised to schedule next injection in 14 days.   Patient states Mancel Parsons was sent in a few months ago and was denied by insurance, he has changed insurance since that time and wants to see if this is covered now. Per Dr. Darene Lamer okay to send in first dosage of Wegovy, this has been sent to [facility].

## 2022-03-23 ENCOUNTER — Ambulatory Visit (INDEPENDENT_AMBULATORY_CARE_PROVIDER_SITE_OTHER): Payer: Self-pay | Admitting: Sports Medicine

## 2022-03-23 VITALS — BP 139/73 | HR 63 | Ht 73.0 in

## 2022-03-23 DIAGNOSIS — E291 Testicular hypofunction: Secondary | ICD-10-CM

## 2022-03-23 NOTE — Patient Instructions (Signed)
Return in 14 days for nurse visit for testosterone injection.

## 2022-03-23 NOTE — Progress Notes (Signed)
   Established Patient Office Visit  Subjective   Patient ID: Richard Beltran, male    DOB: Oct 29, 1965  Age: 56 y.o. MRN: [id number]  Chief Complaint  Patient presents with   testosterone injection    Nurse visit    HPI  Testosterone injection- pt denies  chest pain, shortness of breath, headache, mood changes or problems with medication. Injection given two days early due to the holidays.   ROS    Objective:     BP (!) 152/83   Pulse 66   Ht '6\' 1"'$  (1.854 m)   SpO2 97%   BMI 38.13 kg/m    Physical Exam   No results found for any visits on 03/23/22.    The 10-year ASCVD risk score (Arnett DK, et al., 2019) is: 17.6%    Assessment & Plan:  Testosterone injection - administered injection. Patient tolerated the injection well without complications.  I had spoke with Dr. Dianah Field who approved giving the injection two days early. Patient will schedule a nurse visit in 14 days for next testosterone injection.  Problem List Items Addressed This Visit       Endocrine   Male hypogonadism - Primary    Return in about 2 weeks (around 04/06/2022) for nurse visit for testosterone injection. Rae Lips, LPN

## 2022-04-06 ENCOUNTER — Ambulatory Visit (INDEPENDENT_AMBULATORY_CARE_PROVIDER_SITE_OTHER): Payer: Self-pay | Admitting: Sports Medicine

## 2022-04-06 ENCOUNTER — Other Ambulatory Visit: Payer: Self-pay

## 2022-04-06 VITALS — BP 132/77 | HR 66 | Ht 73.0 in | Wt 282.0 lb

## 2022-04-06 DIAGNOSIS — M5136 Other intervertebral disc degeneration, lumbar region: Secondary | ICD-10-CM

## 2022-04-06 DIAGNOSIS — E291 Testicular hypofunction: Secondary | ICD-10-CM

## 2022-04-06 MED ORDER — TRAMADOL HCL 50 MG PO TABS: 100.0000 mg | ORAL_TABLET | Freq: Two times a day (BID) | ORAL | 0 refills | Status: DC

## 2022-04-06 NOTE — Telephone Encounter (Signed)
Patient was in for his testosterone injection and asked for a refill of his tramadol.

## 2022-04-06 NOTE — Progress Notes (Signed)
   Subjective:    Patient ID: Richard Beltran, male    DOB: 03-11-1966, 56 y.o.   MRN: [id number]  HPI Patient is here for a testosterone injection. Denies chest pain, shortness of breath, headaches and problems with medication or mood changes.    Review of Systems     Objective:   Physical Exam        Assessment & Plan:  Patient tolerated injection well without complications in the RUOQ. Patient advised to scheduled next injection in 14 days.

## 2022-04-15 ENCOUNTER — Telehealth: Payer: Self-pay | Admitting: General Practice

## 2022-04-15 NOTE — Telephone Encounter (Signed)
Transition Care Management Follow-up Telephone Call Date of discharge and from where: 04/14/22 from [place] How have you been since you were released from the hospital? Patient is doing better today; however having some breathing issues since he started using the lidocaine patch. He has stopped the patch but still feels breathing is not the same. Recommended going to the ER but at this time, he does not want to at this time. he is still having back problems and would like to come see one of the doctors here. He stated that he will go the ER if breathing worsens.  Any questions or concerns? No  Items Reviewed: Did the pt receive and understand the discharge instructions provided? Yes  Medications obtained and verified? Yes  Other? No  Any new allergies since your discharge? No  Dietary orders reviewed? Yes Do you have support at home? Yes   Home Care and Equipment/Supplies: Were home health services ordered? no   Functional Questionnaire: (I = Independent and D = Dependent) ADLs: I  Bathing/Dressing- I  Meal Prep- I  Eating- I  Maintaining continence- I  Transferring/Ambulation- I  Managing Meds- I  Follow up appointments reviewed:  PCP Hospital f/u appt confirmed? Yes  Scheduled to see Dr. Darene Lamer  on 04/27/22 @ 1110. [place] [facility] f/u appt confirmed? No   Are transportation arrangements needed? No  If their condition worsens, is the pt aware to call PCP or go to the Emergency Dept.? Yes Was the patient provided with contact information for the PCP's office or ED? Yes Was to pt encouraged to call back with questions or concerns? Yes

## 2022-04-19 ENCOUNTER — Ambulatory Visit
Admission: EM | Admit: 2022-04-19 | Discharge: 2022-04-19 | Disposition: A | Discharge status: Home / Self Care | Payer: Commercial Managed Care - HMO | Attending: Family Medicine | Admitting: Family Medicine

## 2022-04-19 DIAGNOSIS — M5441 Lumbago with sciatica, right side: Secondary | ICD-10-CM

## 2022-04-19 DIAGNOSIS — T50905A Adverse effect of unspecified drugs, medicaments and biological substances, initial encounter: Secondary | ICD-10-CM | POA: Diagnosis not present

## 2022-04-19 DIAGNOSIS — M4726 Other spondylosis with radiculopathy, lumbar region: Secondary | ICD-10-CM | POA: Diagnosis not present

## 2022-04-19 DIAGNOSIS — R351 Nocturia: Secondary | ICD-10-CM | POA: Diagnosis not present

## 2022-04-19 MED ORDER — METHYLPREDNISOLONE 4 MG PO TBPK: ORAL_TABLET | ORAL | 0 refills | Status: DC

## 2022-04-19 MED ORDER — HYDROCODONE-ACETAMINOPHEN 7.5-325 MG PO TABS: 1.0000 | ORAL_TABLET | Freq: Four times a day (QID) | ORAL | 0 refills | Status: DC | PRN

## 2022-04-19 MED ORDER — KETOROLAC TROMETHAMINE 30 MG/ML IJ SOLN
60.0000 mg | Freq: Once | INTRAMUSCULAR | Status: AC
Start: 2022-04-19 — End: 2022-04-19
  Administered 2022-04-19: 60 mg via INTRAMUSCULAR

## 2022-04-19 MED ORDER — TIZANIDINE HCL 4 MG PO TABS: 4.0000 mg | ORAL_TABLET | Freq: Four times a day (QID) | ORAL | 0 refills | Status: DC | PRN

## 2022-04-19 NOTE — ED Provider Notes (Signed)
[name]    CSN: [id number] Arrival date & time: 04/19/22  1117      History   Chief Complaint Chief Complaint  Patient presents with   Back Pain    HPI Richard Beltran is a 56 y.o. male.   HPI  Patient was seen on 04/14/2022 for low back pain.  He states that he was up on a ladder trying to put up Christmas lights outdoors.  As he came down the ladder his foot slipped and he missed the last 2 steps, held onto the ladder but had a twisting and wrenching movement.  Has had pain ever since.  Had back surgery about 15 years ago with multilevel disc disease and fusion.  His back has been good since that time.  He does resist heavy bending and lifting activities.  Now with pain in R lower back and pain into the thigh A CAT scan was done.  No bony abnormality identified.  Patient was treated with Lidoderm patches.  He states every time he puts on the patch it makes him feel short of breath and he has to remove it.  This is listed as an allergy.  He states he does not have any allergy to lidocaine.  Patient also was given Flexeril.  He states this makes him somewhat drowsy.  He is also noted since starting the Flexeril that he has to get up and urinate 14 times a night.  I explained to him that Flexeril can cause prostatic enlargement and urinary retention.  He knows to stop this medication.  Patient has tramadol that he takes chronically for pain.  He states that it is not strong enough for the current pain that he is experiencing.  He states he was told by the ER physician to stop his tramadol.  Past Medical History:  Diagnosis Date   Back pain    Hyperlipidemia    Hypertension    Hypertension    Joint pain    Lumbar degenerative disc disease 10/29/2013   Post L4-L5 fusion.    Male hypogonadism    Other fatigue    Shortness of breath on exertion     Patient Active Problem List   Diagnosis Date Noted   Primary osteoarthritis of left knee 10/14/2020   Sciatica  associated with disorder of lumbar spine 07/16/2019   Plantar fasciitis, left 04/10/2018   Insomnia 07/29/2015   Obesity 11/18/2014   Obstructive sleep apnea 10/22/2014   Male hypogonadism 11/06/2013   Hypertension, essential, benign 10/29/2013   Lumbar degenerative disc disease 10/29/2013   Pulmonary nodule 10/29/2013   Annual physical exam 10/29/2013   Adhesive capsulitis of right shoulder 10/29/2013    Past Surgical History:  Procedure Laterality Date   BACK SURGERY  2005       Home Medications    Prior to Admission medications   Medication Sig Start Date End Date Taking? Authorizing Provider  HYDROcodone-acetaminophen (NORCO) 7.5-325 MG tablet Take 1-2 tablets by mouth every 6 (six) hours as needed for moderate pain. MDD=6 04/19/22  Yes Raylene Everts, MD  methylPREDNISolone (MEDROL DOSEPAK) 4 MG TBPK tablet tad 04/19/22  Yes Raylene Everts, MD  tiZANidine (ZANAFLEX) 4 MG tablet Take 1-2 tablets (4-8 mg total) by mouth every 6 (six) hours as needed for muscle spasms. 04/19/22  Yes Raylene Everts, MD  amLODipine (NORVASC) 10 MG tablet Take 1 tablet (10 mg total) by mouth daily. 07/16/21   Silverio Decamp, MD  carvedilol (COREG)  25 MG tablet Take 1 tablet (25 mg total) by mouth 2 (two) times daily with a meal. 07/16/21   Silverio Decamp, MD  lisinopril-hydrochlorothiazide (ZESTORETIC) 20-25 MG tablet Take 1 tablet by mouth daily. 03/08/22   Silverio Decamp, MD  meloxicam (MOBIC) 15 MG tablet Take 1 tablet (15 mg total) by mouth daily. 08/11/21   Silverio Decamp, MD  Semaglutide-Weight Management (WEGOVY) 0.25 MG/0.5ML SOAJ Inject 0.25 mg into the skin once a week. 03/11/22   Silverio Decamp, MD  traMADol (ULTRAM) 50 MG tablet Take 2 tablets (100 mg total) by mouth 2 (two) times daily. 04/06/22   Silverio Decamp, MD  VENTOLIN HFA 108 (90 Base) MCG/ACT inhaler INHALE 2 PUFFS BY MOUTH EVERY 4 HOURS AS NEEDED 11/06/21   Donella Stade,  PA-C    Family History Family History  Problem Relation Age of Onset   Diabetes Mother    Hyperlipidemia Mother    Hypertension Mother    Healthy Father    Hypertension Father    Healthy Maternal Grandmother    Healthy Maternal Grandfather    Healthy Paternal Grandmother    Healthy Paternal Grandfather    Colon cancer Neg Hx    Colon polyps Neg Hx    Esophageal cancer Neg Hx    Rectal cancer Neg Hx    Stomach cancer Neg Hx     Social History Social History   Tobacco Use   Smoking status: Never   Smokeless tobacco: Never  Vaping Use   Vaping Use: Never used  Substance Use Topics   Alcohol use: No   Drug use: No     Allergies   Hm lidocaine patch [lidocaine], Eggs or egg-derived products, and Flexeril [cyclobenzaprine]   Review of Systems Review of Systems  See HPI Physical Exam Triage Vital Signs ED Triage Vitals  Enc Vitals Group     BP 04/19/22 1132 (!) 163/97     Pulse Rate 04/19/22 1132 74     Resp 04/19/22 1132 14     Temp 04/19/22 1132 98.6 F (37 C)     Temp Source 04/19/22 1132 Oral     SpO2 04/19/22 1132 97 %     Weight --      Height --      Head Circumference --      Peak Flow --      Pain Score 04/19/22 1131 10     Pain Loc --      Pain Edu? --      Excl. in [place]? --    No data found.  Updated Vital Signs BP (!) 163/97 (BP Location: Left Arm)   Pulse 74   Temp 98.6 F (37 C) (Oral)   Resp 14   SpO2 97%   Physical Exam Constitutional:      General: He is in acute distress.     Appearance: He is well-developed. He is obese.  HENT:     Head: Normocephalic and atraumatic.  Eyes:     Conjunctiva/sclera: Conjunctivae normal.     Pupils: Pupils are equal, round, and reactive to light.  Cardiovascular:     Rate and Rhythm: Normal rate.  Pulmonary:     Effort: Pulmonary effort is normal. No respiratory distress.  Abdominal:     General: There is no distension.     Palpations: Abdomen is soft.  Musculoskeletal:        General:  Tenderness present. No swelling. Normal range of motion.  Cervical back: Normal range of motion.     Comments: Tenderness is localized to the right SI region.  No palpable mass.  No muscle tenderness or spasm.  Patient has very limited range of motion secondary to pain.  Straight leg on the right is positive at 45 degrees for increased back and thigh pain.  On the left is negative.  Reflexes are dull throughout.  Sensory exam is normal throughout.  Patient does have positive Waddell sign, increased pain with pressure on cranium as well as pain with distraction  Skin:    General: Skin is warm and dry.  Neurological:     General: No focal deficit present.     Mental Status: He is alert.     Gait: Gait abnormal.     Comments: Guarded movements      UC Treatments / Results  Labs (all labs ordered are listed, but only abnormal results are displayed) Labs Reviewed - No data to display  EKG   Radiology No results found.  Procedures Procedures (including critical care time)  Medications Ordered in UC Medications  ketorolac (TORADOL) 30 MG/ML injection 60 mg (60 mg Intramuscular Given 04/19/22 1159)    Initial Impression / Assessment and Plan / UC Course  I have reviewed the triage vital signs and the nursing notes.  Pertinent labs & imaging results that were available during my care of the patient were reviewed by me and considered in my medical decision making (see chart for details).     Final Clinical Impressions(s) / UC Diagnoses   Final diagnoses:  Acute midline low back pain with right-sided sciatica  Osteoarthritis of spine with radiculopathy, lumbar region  Adverse effect of drug, initial encounter  Nocturia     Discharge Instructions      Hold meloxicam while on the steroid Stop the Flexeril.  I think this is causing your urinary problems Take the Medrol Dosepak as directed.  Take all of day 1 today.  This will help take down swelling and pain around the  nerve May take tizanidine as needed as muscle relaxer.  May cause drowsiness Stop the tramadol if it is not helping.  If tramadol is not strong enough to try hydrocodone for pain.  Do not drive on hydrocodone See Dr. Darene Lamer next week.  Call him sooner if you have problems   ED Prescriptions     Medication Sig Dispense Auth. Provider   methylPREDNISolone (MEDROL DOSEPAK) 4 MG TBPK tablet tad 21 tablet Raylene Everts, MD   tiZANidine (ZANAFLEX) 4 MG tablet Take 1-2 tablets (4-8 mg total) by mouth every 6 (six) hours as needed for muscle spasms. 21 tablet Raylene Everts, MD   HYDROcodone-acetaminophen [facility]) 7.5-325 MG tablet Take 1-2 tablets by mouth every 6 (six) hours as needed for moderate pain. MDD=6 20 tablet Raylene Everts, MD      I have reviewed the PDMP during this encounter.   Raylene Everts, MD 04/19/22 1318

## 2022-04-19 NOTE — ED Triage Notes (Signed)
Pt presents with c/o back pain that began last week after falling off of a ladder. Pt was seen in ED and has follow up next week with DR. T . Pt was advised to be seen in UC due to increased pain. Per pt no fractures were seen in ED.

## 2022-04-19 NOTE — Discharge Instructions (Addendum)
Hold meloxicam while on the steroid Stop the Flexeril.  I think this is causing your urinary problems Take the Medrol Dosepak as directed.  Take all of day 1 today.  This will help take down swelling and pain around the nerve May take tizanidine as needed as muscle relaxer.  May cause drowsiness Stop the tramadol if it is not helping.  If tramadol is not strong enough to try hydrocodone for pain.  Do not drive on hydrocodone See Dr. Darene Lamer next week.  Call him sooner if you have problems

## 2022-04-20 ENCOUNTER — Ambulatory Visit (INDEPENDENT_AMBULATORY_CARE_PROVIDER_SITE_OTHER): Payer: Self-pay | Admitting: Sports Medicine

## 2022-04-20 VITALS — BP 146/82 | HR 83

## 2022-04-20 DIAGNOSIS — E291 Testicular hypofunction: Secondary | ICD-10-CM

## 2022-04-20 NOTE — Progress Notes (Signed)
   Established Patient Office Visit  Subjective   Patient ID: Richard Beltran, male    DOB: Sep 25, 1965  Age: 56 y.o. MRN: [id number]  Chief Complaint  Patient presents with   Hypogonadism    HPI  KOLBI ALTADONNA is here for a testosterone injection. Denies chest pain, shortness of breath, headaches or mood changes.   ROS    Objective:     BP (!) 146/82   Pulse 83   SpO2 97%    Physical Exam   No results found for any visits on 04/20/22.    The 10-year ASCVD risk score (Arnett DK, et al., 2019) is: 16.5%    Assessment & Plan:  Hypogonadism - Patient tolerated injection well without complications. Patient advised to schedule next injection 14 days from today.    Problem List Items Addressed This Visit       Unprioritized   Male hypogonadism - Primary    Return in about 2 weeks (around 05/04/2022) for testosterone injection. Durene Romans, Monico Blitz, [place]

## 2022-04-21 ENCOUNTER — Ambulatory Visit: Payer: Self-pay | Admitting: Family Medicine

## 2022-04-27 ENCOUNTER — Encounter: Payer: Medicaid Other | Admitting: Sports Medicine

## 2022-04-28 NOTE — Progress Notes (Signed)
This encounter was created in error - please disregard.

## 2022-05-03 DIAGNOSIS — Z419 Encounter for procedure for purposes other than remedying health state, unspecified: Secondary | ICD-10-CM | POA: Diagnosis not present

## 2022-05-04 ENCOUNTER — Ambulatory Visit (INDEPENDENT_AMBULATORY_CARE_PROVIDER_SITE_OTHER): Payer: Medicaid Other | Admitting: Sports Medicine

## 2022-05-04 VITALS — BP 143/88 | HR 70 | Ht 73.0 in | Wt 291.0 lb

## 2022-05-04 DIAGNOSIS — I10 Essential (primary) hypertension: Secondary | ICD-10-CM

## 2022-05-04 DIAGNOSIS — E291 Testicular hypofunction: Secondary | ICD-10-CM

## 2022-05-04 MED ORDER — TESTOSTERONE CYPIONATE 200 MG/ML IM SOLN
200.0000 mg | INTRAMUSCULAR | Status: DC
  Administered 2022-05-04 – 2022-06-01 (×3): 200 mg via INTRAMUSCULAR

## 2022-05-04 NOTE — Assessment & Plan Note (Signed)
Blood pressures were elevated today, continue amlodipine, carvedilol, lisinopril/HCTZ, I would like him to check some home blood pressure readings and send me a MyChart message over the next couple of weeks.

## 2022-05-04 NOTE — Progress Notes (Signed)
   Subjective:    Patient ID: Richard Beltran, male    DOB: 12-07-1965, 57 y.o.   MRN: [id number]  HPI Pt here for testosterone injection, pt denies chest pain, SOB, headache, mood changes and problems with medication.    Review of Systems     Objective:   Physical Exam        Assessment & Plan:   Pt tolerated injection well RUOQ without complication. Pt advised to schedule next injection in 2 weeks.

## 2022-05-18 ENCOUNTER — Ambulatory Visit (INDEPENDENT_AMBULATORY_CARE_PROVIDER_SITE_OTHER): Payer: Medicaid Other | Admitting: Sports Medicine

## 2022-05-18 VITALS — BP 149/77 | HR 71 | Ht 73.0 in | Wt 291.8 lb

## 2022-05-18 DIAGNOSIS — E291 Testicular hypofunction: Secondary | ICD-10-CM | POA: Diagnosis not present

## 2022-05-18 NOTE — Progress Notes (Signed)
   Established Patient Office Visit  Subjective   Patient ID: Richard Beltran, male    DOB: Jun 16, 1965  Age: 57 y.o. MRN: [id number]  No chief complaint on file.   HPI  Nurse visit - testosterone injection-patient denies  chest pain, shortness of breath, headache, mood changes or problems with medication.   ROS    Objective:     BP (!) 149/77   Pulse 71   Ht '6\' 1"'$  (1.854 m)   Wt 291 lb 12 oz (132.3 kg)   SpO2 98%   BMI 38.49 kg/m    Physical Exam   No results found for any visits on 05/18/22.    The 10-year ASCVD risk score (Arnett DK, et al., 2019) is: 17%    Assessment & Plan:  Testosterone injection admin LUOQ- patient tolerated injection well without complications. Patient will return in 14 days for nurse visit for testosterone injection.  Problem List Items Addressed This Visit   None   No follow-ups on file.    Rae Lips, LPN

## 2022-06-01 ENCOUNTER — Other Ambulatory Visit: Payer: Self-pay

## 2022-06-01 ENCOUNTER — Ambulatory Visit (INDEPENDENT_AMBULATORY_CARE_PROVIDER_SITE_OTHER): Payer: Medicaid Other | Admitting: Sports Medicine

## 2022-06-01 DIAGNOSIS — M5136 Other intervertebral disc degeneration, lumbar region: Secondary | ICD-10-CM

## 2022-06-01 DIAGNOSIS — E291 Testicular hypofunction: Secondary | ICD-10-CM

## 2022-06-01 MED ORDER — TRAMADOL HCL 50 MG PO TABS: 100.0000 mg | ORAL_TABLET | Freq: Two times a day (BID) | ORAL | 0 refills | Status: DC

## 2022-06-01 NOTE — Progress Notes (Signed)
   Subjective:    Patient ID: Richard Beltran, male    DOB: 06-12-1965, 57 y.o.   MRN: [id number]  HPI  Patient is here for a testosterone injection. Denies chest pain, shortness of breath, headaches and problems with medication or mood changes.   Review of Systems     Objective:   Physical Exam        Assessment & Plan:   Patient tolerated injection well without complications. Patient advised to schedule next injection in 2 weeks.  Location: RUOQ

## 2022-06-03 DIAGNOSIS — Z419 Encounter for procedure for purposes other than remedying health state, unspecified: Secondary | ICD-10-CM | POA: Diagnosis not present

## 2022-06-15 ENCOUNTER — Ambulatory Visit (INDEPENDENT_AMBULATORY_CARE_PROVIDER_SITE_OTHER): Payer: Medicaid Other | Admitting: Sports Medicine

## 2022-06-15 VITALS — BP 136/77 | HR 70 | Ht 73.0 in | Wt 289.0 lb

## 2022-06-15 DIAGNOSIS — E291 Testicular hypofunction: Secondary | ICD-10-CM

## 2022-06-15 MED ORDER — TESTOSTERONE CYPIONATE 200 MG/ML IM SOLN
200.0000 mg | INTRAMUSCULAR | Status: DC
  Administered 2022-06-15 – 2022-06-29 (×2): 200 mg via INTRAMUSCULAR

## 2022-06-15 NOTE — Progress Notes (Signed)
   Subjective:    Patient ID: Richard Beltran, male    DOB: Nov 27, 1965, 57 y.o.   MRN: [id number]  HPI Pt here for  testosterone injection-patient denies  chest pain, shortness of breath, headache, mood changes or problems with medication.      Review of Systems     Objective:   Physical Exam        Assessment & Plan:  Pt tolerated injection well in LUOQ without complications. Patient advised to return in 14 days for next testosterone injection.

## 2022-06-29 ENCOUNTER — Ambulatory Visit (INDEPENDENT_AMBULATORY_CARE_PROVIDER_SITE_OTHER): Payer: Medicaid Other | Admitting: Sports Medicine

## 2022-06-29 VITALS — BP 144/85 | HR 83 | Ht 73.0 in

## 2022-06-29 DIAGNOSIS — E291 Testicular hypofunction: Secondary | ICD-10-CM | POA: Diagnosis not present

## 2022-06-29 NOTE — Progress Notes (Signed)
   Established Patient Office Visit  Subjective   Patient ID: Richard Beltran, male    DOB: 11-18-1965  Age: 57 y.o. MRN: [id number]  Chief Complaint  Patient presents with   male hypogonadism    Testosterone injection- nurse visit    HPI  Male hypogonadism - testosterone injection - nurse visit - patient denies mood changes,  dizziness, chest pain or problems with medication.   ROS    Objective:     BP (!) 144/85   Pulse 83   Ht 6' 1"$  (1.854 m)   SpO2 99%   BMI 38.13 kg/m    Physical Exam   No results found for any visits on 06/29/22.    The 10-year ASCVD risk score (Arnett DK, et al., 2019) is: 16.1%    Assessment & Plan:  Testosterone injection- nurse visit. Administered injection RUOQ IM . Patient tolerated injection well without complications. Patient to return in one week for lab work to recheck testosterone levels and return in 2 weeks for nurse visit testosterone injection.  Problem List Items Addressed This Visit       Endocrine   Male hypogonadism - Primary    Return in about 1 week (around 07/06/2022) for return 07/06/22 for labwork for testosterone levels, then also schld for 07/13/22 nurse vist test inj.Rae Lips, LPN

## 2022-07-02 DIAGNOSIS — Z419 Encounter for procedure for purposes other than remedying health state, unspecified: Secondary | ICD-10-CM | POA: Diagnosis not present

## 2022-07-13 ENCOUNTER — Telehealth: Payer: Self-pay

## 2022-07-13 ENCOUNTER — Other Ambulatory Visit: Payer: Self-pay | Admitting: Sports Medicine

## 2022-07-13 ENCOUNTER — Ambulatory Visit (INDEPENDENT_AMBULATORY_CARE_PROVIDER_SITE_OTHER): Payer: Medicaid Other | Admitting: Sports Medicine

## 2022-07-13 ENCOUNTER — Other Ambulatory Visit: Payer: Self-pay

## 2022-07-13 VITALS — BP 138/80 | HR 86 | Ht 73.0 in | Wt 289.0 lb

## 2022-07-13 DIAGNOSIS — M5136 Other intervertebral disc degeneration, lumbar region: Secondary | ICD-10-CM

## 2022-07-13 DIAGNOSIS — E291 Testicular hypofunction: Secondary | ICD-10-CM | POA: Diagnosis not present

## 2022-07-13 DIAGNOSIS — I10 Essential (primary) hypertension: Secondary | ICD-10-CM

## 2022-07-13 DIAGNOSIS — E782 Mixed hyperlipidemia: Secondary | ICD-10-CM

## 2022-07-13 DIAGNOSIS — M51369 Other intervertebral disc degeneration, lumbar region without mention of lumbar back pain or lower extremity pain: Secondary | ICD-10-CM

## 2022-07-13 MED ORDER — TRAMADOL HCL 50 MG PO TABS: 100.0000 mg | ORAL_TABLET | Freq: Two times a day (BID) | ORAL | 0 refills | Status: DC

## 2022-07-13 MED ORDER — TESTOSTERONE CYPIONATE 200 MG/ML IM SOLN
200.0000 mg | INTRAMUSCULAR | Status: DC
  Administered 2022-07-13 – 2022-10-19 (×7): 200 mg via INTRAMUSCULAR

## 2022-07-13 NOTE — Patient Instructions (Signed)
Return next Tuesday for Labwork, also schedule next Testosterone injection for 2 weeks.

## 2022-07-13 NOTE — Progress Notes (Addendum)
   Subjective:    Patient ID: Richard Beltran, male    DOB: 08-08-1965, 57 y.o.   MRN: [id number]  HPI Pt is here for a testosterone injection.  He denies chest pain, shortness of breath, headaches and problems with medication or mood changes.   Review of Systems     Objective:   Physical Exam        Assessment & Plan:  He tolerated well without complications. Pt is advised to schedule labs for next Tuesday. And schedule next injection in 2 weeks. He is also requesting a refill on Ultram 50 mg tabs. Medication will be pended in pt's chart for approval.

## 2022-07-13 NOTE — Addendum Note (Signed)
Addended by: Silverio Decamp on: 07/13/2022 11:22 AM   Modules accepted: Orders

## 2022-07-13 NOTE — Telephone Encounter (Signed)
Done

## 2022-07-13 NOTE — Telephone Encounter (Signed)
Pt was in the office today for a nurse visit. He is requesting Ultram 50 mg tabs.   Last office visit: 10/26/2021 Last refill: 06/01/2022 Next visit: No future office visits scheduled  Pended in orders only.

## 2022-07-20 ENCOUNTER — Other Ambulatory Visit: Payer: Self-pay | Admitting: Sports Medicine

## 2022-07-20 DIAGNOSIS — I10 Essential (primary) hypertension: Secondary | ICD-10-CM

## 2022-07-20 DIAGNOSIS — E291 Testicular hypofunction: Secondary | ICD-10-CM

## 2022-07-21 ENCOUNTER — Encounter: Payer: Self-pay | Admitting: Sports Medicine

## 2022-07-21 ENCOUNTER — Other Ambulatory Visit: Payer: Self-pay | Admitting: Pharmacist

## 2022-07-21 DIAGNOSIS — E782 Mixed hyperlipidemia: Secondary | ICD-10-CM | POA: Insufficient documentation

## 2022-07-21 NOTE — Progress Notes (Signed)
Patient appearing on report for [employer] - Hypertension Control report due to last documented ambulatory blood pressure of 144/85 on 06/29/22. Next appointment with PCP is 07/27/22   Outreached patient to discuss hypertension control and medication management. Phone rang and did not connect to voicemail, unable to leave voicemail for patient to return my call at their convenience.   Larinda Buttery, PharmD Clinical Pharmacist [facility] [phone]

## 2022-07-22 MED ORDER — ROSUVASTATIN CALCIUM 10 MG PO TABS: 10.0000 mg | ORAL_TABLET | Freq: Every day | ORAL | 3 refills | Status: DC

## 2022-07-22 NOTE — Assessment & Plan Note (Signed)
Adding Crestor 10 mg, will do a 14-month recheck.

## 2022-07-22 NOTE — Addendum Note (Signed)
Addended by: Silverio Decamp on: 07/22/2022 10:21 AM   Modules accepted: Orders

## 2022-07-24 LAB — LIPID PANEL
Cholesterol: 207 mg/dL — ABNORMAL HIGH (ref <200)
HDL: 39 mg/dL — ABNORMAL LOW (ref >=40)
LDL Cholesterol (Calc): 142 mg/dL (calc) — ABNORMAL HIGH
Non-HDL Cholesterol (Calc): 168 mg/dL (calc) — ABNORMAL HIGH (ref <130)
Total CHOL/HDL Ratio: 5.3 (calc) — ABNORMAL HIGH (ref <5.0)
Triglycerides: 134 mg/dL (ref <150)

## 2022-07-24 LAB — COMPREHENSIVE METABOLIC PANEL
AG Ratio: 1.5 (calc) (ref 1.0–2.5)
ALT: 23 U/L (ref 9–46)
AST: 17 U/L (ref 10–35)
Albumin: 4.3 g/dL (ref 3.6–5.1)
Alkaline phosphatase (APISO): 61 U/L (ref 35–144)
BUN: 12 mg/dL (ref 7–25)
CO2: 28 mmol/L (ref 20–32)
Calcium: 9.4 mg/dL (ref 8.6–10.3)
Chloride: 104 mmol/L (ref 98–110)
Creat: 1.13 mg/dL (ref 0.70–1.30)
Globulin: 2.8 g/dL (calc) (ref 1.9–3.7)
Glucose, Bld: 98 mg/dL (ref 65–99)
Potassium: 4 mmol/L (ref 3.5–5.3)
Sodium: 141 mmol/L (ref 135–146)
Total Bilirubin: 0.6 mg/dL (ref 0.2–1.2)
Total Protein: 7.1 g/dL (ref 6.1–8.1)

## 2022-07-24 LAB — CBC
HCT: 50.6 % — ABNORMAL HIGH (ref 38.5–50.0)
Hemoglobin: 16.2 g/dL (ref 13.2–17.1)
MCH: 28.7 pg (ref 27.0–33.0)
MCHC: 32 g/dL (ref 32.0–36.0)
MCV: 89.7 fL (ref 80.0–100.0)
MPV: 10.2 fL (ref 7.5–12.5)
Platelets: 227 10*3/uL (ref 140–400)
RBC: 5.64 10*6/uL (ref 4.20–5.80)
RDW: 12.7 % (ref 11.0–15.0)
WBC: 7.1 10*3/uL (ref 3.8–10.8)

## 2022-07-24 LAB — TESTOSTERONE, FREE & TOTAL
Free Testosterone: 208.4 pg/mL — ABNORMAL HIGH (ref 35.0–155.0)
Testosterone, Total, LC-MS-MS: 850 ng/dL (ref 250–1100)

## 2022-07-24 LAB — HEMOGLOBIN A1C
Hgb A1c MFr Bld: 5.5 % of total Hgb (ref <5.7)
Mean Plasma Glucose: 111 mg/dL
eAG (mmol/L): 6.2 mmol/L

## 2022-07-24 LAB — TSH: TSH: 1.45 mIU/L (ref 0.40–4.50)

## 2022-07-24 LAB — PSA, TOTAL AND FREE
PSA, % Free: 50 % (calc) (ref >25)
PSA, Free: 0.3 ng/mL
PSA, Total: 0.6 ng/mL (ref <=4.0)

## 2022-07-27 ENCOUNTER — Telehealth: Payer: Self-pay

## 2022-07-27 ENCOUNTER — Ambulatory Visit (INDEPENDENT_AMBULATORY_CARE_PROVIDER_SITE_OTHER): Payer: Medicaid Other | Admitting: Sports Medicine

## 2022-07-27 VITALS — BP 120/73 | HR 75 | Ht 73.0 in

## 2022-07-27 DIAGNOSIS — E291 Testicular hypofunction: Secondary | ICD-10-CM | POA: Diagnosis not present

## 2022-07-27 NOTE — Telephone Encounter (Signed)
Attempted call to patient . Phone rang without answer. Could not leave a voice mail message.

## 2022-07-27 NOTE — Patient Instructions (Signed)
nurse visit for 14 days for next testosterone injection  nurse visit.

## 2022-07-27 NOTE — Telephone Encounter (Signed)
Yes he should get all 3, the shingles vaccine will be a 2 part vaccine as well, for COVID he can get any one of the brands.

## 2022-07-27 NOTE — Telephone Encounter (Signed)
Patient states his pharmacy had recommended he get some vaccines.  Tdap, shingles and either covid or pneumonia.(Patient was unsure what the third recommended vaccine was)   He would like to know if you would recommend he have these vaccines before he schedules at the pharmacy.

## 2022-07-27 NOTE — Progress Notes (Signed)
   Established Patient Office Visit  Subjective   Patient ID: Richard Beltran, male    DOB: February 26, 1966  Age: 57 y.o. MRN: [id number]  Chief Complaint  Patient presents with   male hypogonadism    Testosterone injection. Nurse visit.     HPI Male hypogonadism. Testosterone injection. Nurse visit. Patient denies chest pain, shortness of breath, dizziness, palpitations, mood changes or problems with medication.  ROS    Objective:     BP 120/73   Pulse 75   Ht 6\' 1"  (1.854 m)   SpO2 98%   BMI 38.13 kg/m    Physical Exam   No results found for any visits on 07/27/22.    The 10-year ASCVD risk score (Arnett DK, et al., 2019) is: 11%    Assessment & Plan:  Testosterone injection -nurse visit. Lab work results from 07/20/22 reviewed by provider -instructed to continue injection as 200mg /ml every 14 days.  Injected 200mg  IM RUOQ. Patient tolerated injection well without complications. Schedule nurse visit for 14 days for next testosterone injection  nurse visit.  Problem List Items Addressed This Visit       Endocrine   Male hypogonadism - Primary    Return in about 2 weeks (around 08/10/2022) for nurse visit for testosterone injection .    Rae Lips, LPN

## 2022-07-28 NOTE — Telephone Encounter (Signed)
Patient informed. 

## 2022-08-02 DIAGNOSIS — Z419 Encounter for procedure for purposes other than remedying health state, unspecified: Secondary | ICD-10-CM | POA: Diagnosis not present

## 2022-08-10 ENCOUNTER — Ambulatory Visit (INDEPENDENT_AMBULATORY_CARE_PROVIDER_SITE_OTHER): Payer: Medicaid Other | Admitting: Sports Medicine

## 2022-08-10 VITALS — BP 175/107 | HR 74 | Temp 97.5°F | Ht 73.0 in | Wt 293.1 lb

## 2022-08-10 DIAGNOSIS — E291 Testicular hypofunction: Secondary | ICD-10-CM | POA: Diagnosis not present

## 2022-08-10 DIAGNOSIS — I10 Essential (primary) hypertension: Secondary | ICD-10-CM

## 2022-08-10 MED ORDER — TESTOSTERONE CYPIONATE 200 MG/ML IM SOLN
200.0000 mg | Freq: Once | INTRAMUSCULAR | Status: AC
  Administered 2022-08-10: 200 mg via INTRAMUSCULAR

## 2022-08-10 MED ORDER — CARVEDILOL 25 MG PO TABS: 25.0000 mg | ORAL_TABLET | Freq: Two times a day (BID) | ORAL | 3 refills | Status: DC

## 2022-08-10 NOTE — Progress Notes (Signed)
Patient is here for a testosterone injection. Denies chest pain, shortness of breath, headaches and problems with medication or mood changes. Patient's blood pressure was out of goal. Patient sat for 15 minutes. His blood pressure remained out of goal. Provider was informed of the patient's current readings. Per provider, testosterone injection administered to the patient as scheduled.   Patient tolerated injection on LUOQ well without complications. Patient advised to schedule next injection in 14 days.

## 2022-08-16 ENCOUNTER — Other Ambulatory Visit: Payer: Self-pay | Admitting: Sports Medicine

## 2022-08-16 DIAGNOSIS — M5136 Other intervertebral disc degeneration, lumbar region: Secondary | ICD-10-CM

## 2022-08-19 ENCOUNTER — Other Ambulatory Visit: Payer: Self-pay | Admitting: Sports Medicine

## 2022-08-19 DIAGNOSIS — M5136 Other intervertebral disc degeneration, lumbar region: Secondary | ICD-10-CM

## 2022-08-20 ENCOUNTER — Other Ambulatory Visit: Payer: Self-pay | Admitting: Sports Medicine

## 2022-08-20 DIAGNOSIS — M5136 Other intervertebral disc degeneration, lumbar region: Secondary | ICD-10-CM

## 2022-08-24 ENCOUNTER — Ambulatory Visit (INDEPENDENT_AMBULATORY_CARE_PROVIDER_SITE_OTHER): Payer: Medicaid Other | Admitting: Sports Medicine

## 2022-08-24 ENCOUNTER — Other Ambulatory Visit: Payer: Self-pay | Admitting: Sports Medicine

## 2022-08-24 ENCOUNTER — Other Ambulatory Visit: Payer: Self-pay

## 2022-08-24 DIAGNOSIS — M5136 Other intervertebral disc degeneration, lumbar region: Secondary | ICD-10-CM

## 2022-08-24 DIAGNOSIS — E291 Testicular hypofunction: Secondary | ICD-10-CM | POA: Diagnosis not present

## 2022-08-24 NOTE — Progress Notes (Signed)
   Established Patient Office Visit  Subjective   Patient ID: Richard Beltran, male    DOB: [birth year]  Age: 57 y.o. MRN: [id number]  Chief Complaint  Patient presents with   Hypogonadism    HPI  BECK COFER is here for a testosterone injection. Denies chest pain, shortness of breath, headaches or mood changes.   ROS    Objective:     There were no vitals taken for this visit.   Physical Exam   No results found for any visits on 08/24/22.    The 10-year ASCVD risk score (Arnett DK, et al., 2019) is: 21.4%    Assessment & Plan:  Hypogonadism - Patient tolerated injection well without complications. Patient advised to schedule next injection 14 days from today.    Problem List Items Addressed This Visit       Unprioritized   Male hypogonadism - Primary    Return in about 2 weeks (around 09/07/2022) for testosterone injection. Earna Coder, Janalyn Harder, CMA

## 2022-08-25 ENCOUNTER — Telehealth: Payer: Self-pay | Admitting: Sports Medicine

## 2022-08-25 ENCOUNTER — Other Ambulatory Visit: Payer: Self-pay | Admitting: Sports Medicine

## 2022-08-25 DIAGNOSIS — M5136 Other intervertebral disc degeneration, lumbar region: Secondary | ICD-10-CM

## 2022-08-25 MED ORDER — TRAMADOL HCL 50 MG PO TABS: 100.0000 mg | ORAL_TABLET | Freq: Two times a day (BID) | ORAL | 0 refills | Status: DC

## 2022-08-25 NOTE — Telephone Encounter (Signed)
Pt called office requesting Rx for Tramadol 50 mg to be sent to [facility] pharmacy. Patient advises that the Rx written on 08/19/22 has not been received by pharmacy and he is completely out of his medication. Please advise.

## 2022-08-25 NOTE — Telephone Encounter (Signed)
Can you refill his tramadol please

## 2022-08-25 NOTE — Telephone Encounter (Signed)
Patient called requesting a refill of ; Tramadol Ultram    Pharmacy ;  [facility] [address] [place] [address] [phone]

## 2022-08-26 ENCOUNTER — Telehealth: Payer: Self-pay | Admitting: Sports Medicine

## 2022-08-26 MED ORDER — TRAMADOL HCL 50 MG PO TABS: 100.0000 mg | ORAL_TABLET | Freq: Two times a day (BID) | ORAL | 0 refills | Status: DC

## 2022-08-26 NOTE — Telephone Encounter (Signed)
Sent again

## 2022-08-26 NOTE — Telephone Encounter (Signed)
traMADol (ULTRAM) 50 MG tablet [[id number]]  pt is stating his pharmacy states this med has not been authorized and he has been trying for a while to get this medication, can you please check on this for patient and call him back. Thank you

## 2022-08-29 ENCOUNTER — Telehealth: Payer: Self-pay

## 2022-08-29 NOTE — Telephone Encounter (Signed)
Initiated Prior authorization ZOX:[id number] HCl 50MG  tablets Via: Covermymeds Case/Key:[id number] Status: Pending as of 08/29/22 Reason: Notified Pt via: pt does not have Mychart

## 2022-08-30 NOTE — Telephone Encounter (Signed)
Erroneous encounter..tli

## 2022-09-01 DIAGNOSIS — Z419 Encounter for procedure for purposes other than remedying health state, unspecified: Secondary | ICD-10-CM | POA: Diagnosis not present

## 2022-09-07 ENCOUNTER — Ambulatory Visit (INDEPENDENT_AMBULATORY_CARE_PROVIDER_SITE_OTHER): Payer: Medicaid Other | Admitting: Sports Medicine

## 2022-09-07 VITALS — BP 130/74 | HR 73

## 2022-09-07 DIAGNOSIS — E291 Testicular hypofunction: Secondary | ICD-10-CM | POA: Diagnosis not present

## 2022-09-07 NOTE — Progress Notes (Signed)
   Established Patient Office Visit  Subjective   Patient ID: Richard Beltran, male    DOB: 07-26-1965  Age: 56 y.o. MRN: [id number]  Chief Complaint  Patient presents with   Hypogonadism    HPI  Richard Beltran is here for a testosterone injection. Denies chest pain, shortness of breath, headaches or mood changes.   ROS    Objective:     BP 130/74   Pulse 73   SpO2 99%    Physical Exam   No results found for any visits on 09/07/22.    The 10-year ASCVD risk score (Arnett DK, et al., 2019) is: 12.7%    Assessment & Plan:  Testosterone injection - Patient tolerated injection well without complications. Patient advised to schedule next injection 14 days from today.    Problem List Items Addressed This Visit       Unprioritized   Male hypogonadism - Primary    Return in about 2 weeks (around 09/21/2022) for testosterone injection. Earna Coder, Janalyn Harder, CMA

## 2022-09-21 ENCOUNTER — Ambulatory Visit: Payer: Medicaid Other

## 2022-09-21 ENCOUNTER — Ambulatory Visit (INDEPENDENT_AMBULATORY_CARE_PROVIDER_SITE_OTHER): Payer: Medicaid Other | Admitting: Sports Medicine

## 2022-09-21 VITALS — BP 138/66 | HR 74

## 2022-09-21 DIAGNOSIS — E291 Testicular hypofunction: Secondary | ICD-10-CM | POA: Diagnosis not present

## 2022-09-21 NOTE — Progress Notes (Signed)
   Established Patient Office Visit  Subjective   Patient ID: Richard Beltran, male    DOB: 1965/09/13  Age: 57 y.o. MRN: [id number]  Chief Complaint  Patient presents with   Hypogonadism    HPI  Richard Beltran is here for a testosterone injection. Denies chest pain, shortness of breath, headaches or mood changes.   ROS    Objective:     BP 138/66   Pulse 74   SpO2 97%    Physical Exam   No results found for any visits on 09/21/22.    The 10-year ASCVD risk score (Arnett DK, et al., 2019) is: 14.2%    Assessment & Plan:  Testosterone injection - Patient tolerated injection well without complications. Patient advised to schedule next injection 14 days from today.    Problem List Items Addressed This Visit       Unprioritized   Male hypogonadism - Primary    Return in about 2 weeks (around 10/05/2022) for testosterone injection. Earna Coder, Janalyn Harder, CMA

## 2022-09-24 ENCOUNTER — Other Ambulatory Visit: Payer: Self-pay | Admitting: Sports Medicine

## 2022-10-02 DIAGNOSIS — Z419 Encounter for procedure for purposes other than remedying health state, unspecified: Secondary | ICD-10-CM | POA: Diagnosis not present

## 2022-10-05 ENCOUNTER — Other Ambulatory Visit: Payer: Self-pay

## 2022-10-05 ENCOUNTER — Other Ambulatory Visit: Payer: Self-pay | Admitting: Sports Medicine

## 2022-10-05 ENCOUNTER — Ambulatory Visit (INDEPENDENT_AMBULATORY_CARE_PROVIDER_SITE_OTHER): Payer: Medicaid Other | Admitting: Sports Medicine

## 2022-10-05 DIAGNOSIS — M5136 Other intervertebral disc degeneration, lumbar region: Secondary | ICD-10-CM

## 2022-10-05 DIAGNOSIS — E291 Testicular hypofunction: Secondary | ICD-10-CM | POA: Diagnosis not present

## 2022-10-05 NOTE — Progress Notes (Signed)
   Established Patient Office Visit  Subjective   Patient ID: Richard Beltran, male    DOB: 02/20/1966  Age: 57 y.o. MRN: [id number]  Chief Complaint  Patient presents with   Hypogonadism    HPI  MAAHIR WALDROUP is here for a testosterone injection. Denies chest pain, shortness of breath, headaches or mood changes.   ROS    Objective:     There were no vitals taken for this visit.   Physical Exam   No results found for any visits on 10/05/22.    The 10-year ASCVD risk score (Arnett DK, et al., 2019) is: 14.2%    Assessment & Plan:  Testosterone injection - Patient tolerated injection well without complications. Patient advised to schedule next injection 14 days from today.    Problem List Items Addressed This Visit       Unprioritized   Male hypogonadism - Primary    Return in about 2 weeks (around 10/19/2022) for testosterone injection. Earna Coder, Janalyn Harder, CMA

## 2022-10-14 ENCOUNTER — Ambulatory Visit (INDEPENDENT_AMBULATORY_CARE_PROVIDER_SITE_OTHER): Payer: Medicaid Other | Admitting: Family Medicine

## 2022-10-14 ENCOUNTER — Encounter: Payer: Self-pay | Admitting: Family Medicine

## 2022-10-14 VITALS — BP 110/64 | HR 70 | Temp 98.3°F | Resp 20 | Ht 73.0 in | Wt 280.7 lb

## 2022-10-14 DIAGNOSIS — J0110 Acute frontal sinusitis, unspecified: Secondary | ICD-10-CM

## 2022-10-14 MED ORDER — AZITHROMYCIN 500 MG PO TABS: 500.0000 mg | ORAL_TABLET | Freq: Every day | ORAL | 0 refills | Status: DC

## 2022-10-14 MED ORDER — PROMETHAZINE-DM 6.25-15 MG/5ML PO SYRP: 5.0000 mL | ORAL_SOLUTION | Freq: Every evening | ORAL | 0 refills | Status: DC | PRN

## 2022-10-14 NOTE — Progress Notes (Signed)
Acute Office Visit  Subjective:     Patient ID: Richard Beltran, male    DOB: [birth year], 57 y.o.   MRN: [id number]  Chief Complaint  Patient presents with   Sinus Problem    Patient states this past  weekend he had a massage done and they placed cold stones on his face and since then he has been experiencing Nasal Drainage, a bad cough, itchy throat, body aches, and headache    Sinus Problem Associated symptoms include congestion, coughing and a sore throat.  Patient is in today for acute visit.  Pt reports he went to get massage on this past Monday. He reports he used cold stones on his face. He's been taking OTC medicine for his sinus congestion with cough. He reports cough has worsened with sore and scratchy throat. He denies ear pain. He says his cough is dry and worse at night. He is also feeling fatigue and tired.  Review of Systems  HENT:  Positive for congestion, sinus pain and sore throat.   Respiratory:  Positive for cough.   All other systems reviewed and are negative.      Objective:    BP 110/64   Pulse 70   Temp 98.3 F (36.8 C) (Oral)   Resp 20   Ht 6\' 1"  (1.854 m)   Wt 280 lb 11.2 oz (127.3 kg)   SpO2 97%   BMI 37.03 kg/m    Physical Exam Vitals and nursing note reviewed.  Constitutional:      Appearance: Normal appearance. He is obese.  HENT:     Head: Normocephalic and atraumatic.     Right Ear: External ear normal.     Left Ear: External ear normal.     Nose: Nose normal.     Mouth/Throat:     Mouth: Mucous membranes are moist.  Eyes:     Extraocular Movements: Extraocular movements intact.  Cardiovascular:     Rate and Rhythm: Normal rate and regular rhythm.     Pulses: Normal pulses.     Heart sounds: Normal heart sounds.  Pulmonary:     Effort: Pulmonary effort is normal.     Breath sounds: Normal breath sounds.  Skin:    Capillary Refill: Capillary refill takes less than 2 seconds.  Neurological:     General: No focal deficit  present.     Mental Status: He is alert and oriented to person, place, and time. Mental status is at baseline.  Psychiatric:        Mood and Affect: Mood normal.        Behavior: Behavior normal.        Thought Content: Thought content normal.        Judgment: Judgment normal.   No results found for any visits on 10/14/22.      Assessment & Plan:   Problem List Items Addressed This Visit   None  Acute non-recurrent frontal sinusitis -     Azithromycin; Take 1 tablet (500 mg total) by mouth daily.  Dispense: 3 tablet; Refill: 0 -     Promethazine-DM; Take 5 mLs by mouth at bedtime as needed for cough.  Dispense: 118 mL; Refill: 0   Symptoms consistent with sinusitis Treat with Azithromycin 500mg  po daily x 3 days. Sent in cough syrup to help at bedtime prn for cough. No orders of the defined types were placed in this encounter.   No follow-ups on file.  Suzan Slick, MD

## 2022-10-19 ENCOUNTER — Ambulatory Visit (INDEPENDENT_AMBULATORY_CARE_PROVIDER_SITE_OTHER): Payer: Medicaid Other | Admitting: Sports Medicine

## 2022-10-19 VITALS — BP 135/70 | HR 64

## 2022-10-19 DIAGNOSIS — E291 Testicular hypofunction: Secondary | ICD-10-CM | POA: Diagnosis not present

## 2022-10-19 NOTE — Progress Notes (Signed)
   Established Patient Office Visit  Subjective   Patient ID: Richard Beltran, male    DOB: 09/27/1965  Age: 57 y.o. MRN: [id number]  Chief Complaint  Patient presents with   Hypogonadism    HPI  Richard Beltran is here for a testosterone injection. Denies chest pain, shortness of breath, headaches or mood changes.   ROS    Objective:     BP 135/70   Pulse 64   SpO2 98%    Physical Exam   No results found for any visits on 10/19/22.    The 10-year ASCVD risk score (Arnett DK, et al., 2019) is: 13.6%    Assessment & Plan:  Testosterone injection - Patient tolerated injection well without complications. Patient advised to schedule next injection 14 days from today.    Problem List Items Addressed This Visit       Unprioritized   Male hypogonadism - Primary    Return in about 2 weeks (around 11/02/2022) for testosterone injection .    Esmond Harps, CMA

## 2022-11-01 DIAGNOSIS — Z419 Encounter for procedure for purposes other than remedying health state, unspecified: Secondary | ICD-10-CM | POA: Diagnosis not present

## 2022-11-02 ENCOUNTER — Ambulatory Visit (INDEPENDENT_AMBULATORY_CARE_PROVIDER_SITE_OTHER): Payer: Medicaid Other | Admitting: Sports Medicine

## 2022-11-02 VITALS — BP 139/87 | HR 67 | Temp 97.6°F | Ht 73.0 in | Wt 279.0 lb

## 2022-11-02 DIAGNOSIS — E291 Testicular hypofunction: Secondary | ICD-10-CM

## 2022-11-02 MED ORDER — TESTOSTERONE CYPIONATE 200 MG/ML IM SOLN
200.0000 mg | Freq: Once | INTRAMUSCULAR | Status: AC
  Administered 2022-11-02: 200 mg via INTRAMUSCULAR

## 2022-11-02 NOTE — Progress Notes (Signed)
Patient is here for a testosterone injection. Denies chest pain, shortness of breath, headaches and problems with medication or mood changes.   Patient tolerated injection on RUOQ well without complications. Patient advised to schedule next injection in 14 days.   

## 2022-11-09 ENCOUNTER — Other Ambulatory Visit: Payer: Self-pay | Admitting: Sports Medicine

## 2022-11-09 DIAGNOSIS — M5136 Other intervertebral disc degeneration, lumbar region: Secondary | ICD-10-CM

## 2022-11-16 ENCOUNTER — Ambulatory Visit (INDEPENDENT_AMBULATORY_CARE_PROVIDER_SITE_OTHER): Payer: Medicaid Other | Admitting: Sports Medicine

## 2022-11-16 ENCOUNTER — Other Ambulatory Visit: Payer: Self-pay | Admitting: Sports Medicine

## 2022-11-16 VITALS — BP 147/74 | HR 75

## 2022-11-16 DIAGNOSIS — E291 Testicular hypofunction: Secondary | ICD-10-CM | POA: Diagnosis not present

## 2022-11-16 DIAGNOSIS — M5136 Other intervertebral disc degeneration, lumbar region: Secondary | ICD-10-CM

## 2022-11-16 MED ORDER — TESTOSTERONE CYPIONATE 200 MG/ML IM SOLN
200.0000 mg | Freq: Once | INTRAMUSCULAR | Status: AC
  Administered 2022-11-16: 200 mg via INTRAMUSCULAR

## 2022-11-16 NOTE — Progress Notes (Signed)
   Established Patient Office Visit  Subjective   Patient ID: Richard Beltran, male    DOB: [birth year]  Age: 57 y.o. MRN: [id number]  Chief Complaint  Patient presents with   Hypogonadism    HPI  Richard Beltran is here for a testosterone injection. Denies chest pain, shortness of breath, headaches or mood changes.   ROS    Objective:     BP (!) 147/74   Pulse 75   SpO2 97%    Physical Exam   No results found for any visits on 11/16/22.    The 10-year ASCVD risk score (Arnett DK, et al., 2019) is: 16.5%    Assessment & Plan:  Testosterone injection - Patient tolerated injection well without complications. Patient advised to schedule next injection 14 days from today.    Problem List Items Addressed This Visit       Unprioritized   Male hypogonadism - Primary    Return in about 2 weeks (around 11/30/2022) for testosterone injection. Earna Coder, Janalyn Harder, CMA

## 2022-11-17 ENCOUNTER — Other Ambulatory Visit: Payer: Self-pay | Admitting: Sports Medicine

## 2022-11-17 DIAGNOSIS — M5136 Other intervertebral disc degeneration, lumbar region: Secondary | ICD-10-CM

## 2022-11-30 ENCOUNTER — Ambulatory Visit: Payer: Medicaid Other

## 2022-12-01 ENCOUNTER — Ambulatory Visit (INDEPENDENT_AMBULATORY_CARE_PROVIDER_SITE_OTHER): Payer: Medicaid Other | Admitting: Sports Medicine

## 2022-12-01 ENCOUNTER — Telehealth: Payer: Self-pay

## 2022-12-01 VITALS — BP 145/77 | HR 62 | Ht 73.0 in

## 2022-12-01 DIAGNOSIS — E291 Testicular hypofunction: Secondary | ICD-10-CM

## 2022-12-01 MED ORDER — TESTOSTERONE CYPIONATE 200 MG/ML IM SOLN
200.0000 mg | Freq: Once | INTRAMUSCULAR | Status: AC
  Administered 2022-12-01: 200 mg via INTRAMUSCULAR

## 2022-12-01 NOTE — Progress Notes (Unsigned)
   Established Patient Office Visit  Subjective   Patient ID: Richard Beltran, male    DOB: Mar 14, 1966  Age: 57 y.o. MRN: [id number]  Chief Complaint  Patient presents with   male hypogonadism    Testosterone injection nurse visit.     HPI  Male hypogonadism. Testosterone injection nurse visit. Patient denies chest pain, shortness of breath, dizziness,  palpitations, mood changes or medication problems.   ROS    Objective:     BP (!) 145/77   Pulse 62   Ht 6\' 1"  (1.854 m)   SpO2 97%   BMI 36.81 kg/m  {Vitals History (Optional):23777}  Physical Exam   No results found for any visits on 12/01/22.  {Labs (Optional):23779}  The 10-year ASCVD risk score (Arnett DK, et al., 2019) is: 16.1%    Assessment & Plan:  Testosterone injection nurse visit. Admin 200mg  IM RUOQ . Patient tolerated injection well without complications.  Return in 14 days for nurse visit for testosterone injection.  Problem List Items Addressed This Visit   None   No follow-ups on file.    Elizabeth Palau, LPN

## 2022-12-01 NOTE — Patient Instructions (Signed)
Return in 14 days for nurse visit for testosterone injection.

## 2022-12-01 NOTE — Telephone Encounter (Signed)
Patient seen today for nurse visit.   Admin testosterone injection.  Does patient need to go ahead and schedule a follow up visit with you? Last OV 03/11/2022 Last labs 07/20/2022 - elevated free testosterone 208.4 Currently continuing with testosterone injections every 2 weeks.

## 2022-12-02 DIAGNOSIS — Z419 Encounter for procedure for purposes other than remedying health state, unspecified: Secondary | ICD-10-CM | POA: Diagnosis not present

## 2022-12-02 NOTE — Telephone Encounter (Signed)
Patient informed. 

## 2022-12-02 NOTE — Telephone Encounter (Signed)
No need for additional visit with me, he can just see me back sometime at the end of the year or early next year for an annual physical.

## 2022-12-14 ENCOUNTER — Ambulatory Visit: Payer: Medicaid Other

## 2022-12-16 ENCOUNTER — Ambulatory Visit (INDEPENDENT_AMBULATORY_CARE_PROVIDER_SITE_OTHER): Payer: Medicaid Other | Admitting: Sports Medicine

## 2022-12-16 VITALS — BP 135/74 | HR 65 | Ht 73.0 in

## 2022-12-16 DIAGNOSIS — E291 Testicular hypofunction: Secondary | ICD-10-CM | POA: Diagnosis not present

## 2022-12-16 MED ORDER — TESTOSTERONE CYPIONATE 200 MG/ML IM SOLN
200.0000 mg | Freq: Once | INTRAMUSCULAR | Status: AC
  Administered 2022-12-16: 200 mg via INTRAMUSCULAR

## 2022-12-16 NOTE — Progress Notes (Signed)
   Established Patient Office Visit  Subjective   Patient ID: Richard Beltran, male    DOB: 1965-06-15  Age: 57 y.o. MRN: [id number]  Chief Complaint  Patient presents with   Male Hypogonadism    Testosterone injection nurse visit.     HPI  Male Hypogonadism-testosterone injection nurse visit. Patient denies chest pain, shortness of breath, palpitations, dizziness, mood changes or medication problems.   ROS    Objective:     BP 135/74   Pulse 65   Ht 6\' 1"  (1.854 m)   SpO2 98%   BMI 36.81 kg/m    Physical Exam   No results found for any visits on 12/16/22.    The 10-year ASCVD risk score (Arnett DK, et al., 2019) is: 14.2%    Assessment & Plan:  Testosterone injection . Admin 200mg  IM LUOQ. Patient tolerated injection well without complications. Return in 14 days for nurse visit for testosterone injection.  Problem List Items Addressed This Visit   None   No follow-ups on file.    Elizabeth Palau, LPN

## 2022-12-30 ENCOUNTER — Ambulatory Visit (INDEPENDENT_AMBULATORY_CARE_PROVIDER_SITE_OTHER): Payer: Medicaid Other | Admitting: Sports Medicine

## 2022-12-30 ENCOUNTER — Other Ambulatory Visit: Payer: Self-pay

## 2022-12-30 VITALS — BP 146/79 | HR 69 | Ht 73.0 in

## 2022-12-30 DIAGNOSIS — E291 Testicular hypofunction: Secondary | ICD-10-CM

## 2022-12-30 DIAGNOSIS — M5136 Other intervertebral disc degeneration, lumbar region: Secondary | ICD-10-CM

## 2022-12-30 MED ORDER — TRAMADOL HCL 50 MG PO TABS: 100.0000 mg | ORAL_TABLET | Freq: Two times a day (BID) | ORAL | 0 refills | Status: DC

## 2022-12-30 MED ORDER — TESTOSTERONE CYPIONATE 200 MG/ML IM SOLN
200.0000 mg | Freq: Once | INTRAMUSCULAR | Status: AC
  Administered 2022-12-30: 200 mg via INTRAMUSCULAR

## 2022-12-30 NOTE — Patient Instructions (Signed)
Return in 14 days for testosterone injection - nurse visit. 

## 2022-12-30 NOTE — Progress Notes (Signed)
   Established Patient Office Visit  Subjective   Patient ID: Richard Beltran, male    DOB: 30-Dec-1965  Age: 57 y.o. MRN: [id number]  Chief Complaint  Patient presents with   male hypogonadism    Testosterone injection nurse visit.     HPI  Male hypogonadism . Testosterone injection nurse visit. Patient denies shortness of breath, dizziness, palpitations, chest pain or problems with medication.   ROS    Objective:     BP (!) 146/79   Pulse 69   Ht 6\' 1"  (1.854 m)   SpO2 98%   BMI 36.81 kg/m    Physical Exam   No results found for any visits on 12/30/22.    The 10-year ASCVD risk score (Arnett DK, et al., 2019) is: 16.3%    Assessment & Plan:  Testosterone injection nurse visit. Admin 200mg  IM RUOQ. Patient tolerated injection well without complications. Return in 14 days for next testosterone injection nurse visit.  Problem List Items Addressed This Visit       Endocrine   Male hypogonadism - Primary    Return in about 2 weeks (around 01/13/2023) for testosterone injection nurse visit. Elizabeth Palau, LPN

## 2022-12-30 NOTE — Telephone Encounter (Signed)
Requesting rx rf of tramadol Last written 11/18/2022 Last OV 03/11/2022 No upcoming appt schld

## 2022-12-31 NOTE — Telephone Encounter (Signed)
Patient informed. 

## 2023-01-02 DIAGNOSIS — Z419 Encounter for procedure for purposes other than remedying health state, unspecified: Secondary | ICD-10-CM | POA: Diagnosis not present

## 2023-01-11 ENCOUNTER — Ambulatory Visit (INDEPENDENT_AMBULATORY_CARE_PROVIDER_SITE_OTHER): Payer: Medicaid Other

## 2023-01-11 DIAGNOSIS — E291 Testicular hypofunction: Secondary | ICD-10-CM

## 2023-01-11 MED ORDER — TESTOSTERONE CYPIONATE 200 MG/ML IM SOLN
200.0000 mg | Freq: Once | INTRAMUSCULAR | Status: AC
  Administered 2023-01-11: 200 mg via INTRAMUSCULAR

## 2023-01-11 MED ORDER — LISINOPRIL-HYDROCHLOROTHIAZIDE 20-25 MG PO TABS: 1.0000 | ORAL_TABLET | Freq: Every day | ORAL | 1 refills | Status: DC

## 2023-01-11 MED ORDER — AMLODIPINE BESYLATE 10 MG PO TABS: 10.0000 mg | ORAL_TABLET | Freq: Every day | ORAL | 1 refills | Status: DC

## 2023-01-11 NOTE — Progress Notes (Signed)
   Established Patient Office Visit  Subjective   Patient ID: Richard Beltran, male    DOB: 1966/01/21  Age: 57 y.o. MRN: [id number]  Chief Complaint  Patient presents with   Hypogonadism    HPI  Richard Beltran is here for a testosterone injection. Denies chest pain, shortness of breath, headaches or mood changes.   ROS    Objective:     There were no vitals taken for this visit.   Physical Exam   No results found for any visits on 01/11/23.    The 10-year ASCVD risk score (Arnett DK, et al., 2019) is: 16.3%    Assessment & Plan:  Testosterone injection - Patient tolerated injection well without complications. Patient advised to schedule next injection 14 days from today.    Problem List Items Addressed This Visit       Unprioritized   Male hypogonadism - Primary    Return in about 2 weeks (around 01/25/2023) for testosterone injection. Earna Coder, Janalyn Harder, CMA

## 2023-01-25 ENCOUNTER — Ambulatory Visit (INDEPENDENT_AMBULATORY_CARE_PROVIDER_SITE_OTHER): Payer: Medicaid Other

## 2023-01-25 DIAGNOSIS — E291 Testicular hypofunction: Secondary | ICD-10-CM | POA: Diagnosis not present

## 2023-01-25 MED ORDER — TESTOSTERONE CYPIONATE 200 MG/ML IM SOLN
200.0000 mg | Freq: Once | INTRAMUSCULAR | Status: AC
Start: 2023-01-25 — End: 2023-01-25
  Administered 2023-01-25: 200 mg via INTRAMUSCULAR

## 2023-01-25 NOTE — Progress Notes (Signed)
Established Patient Office Visit  Subjective   Patient ID: Richard Beltran, male    DOB: 12/06/1965  Age: 57 y.o. MRN: [id number]  Chief Complaint  Patient presents with   Hypogonadism    HPI  Richard Beltran is here for a testosterone injection. Denies chest pain, shortness of breath, headaches or mood changes.   ROS    Objective:     There were no vitals taken for this visit.   Physical Exam   No results found for any visits on 01/25/23.    The 10-year ASCVD risk score (Arnett DK, et al., 2019) is: 16.3%    Assessment & Plan:  Testosterone injection - Patient tolerated injection well without complications. Patient advised to schedule next injection 14 days from today.    Problem List Items Addressed This Visit       Unprioritized   Male hypogonadism - Primary    Return in about 2 weeks (around 02/08/2023) for testosterone injection. Earna Coder, Janalyn Harder, CMA

## 2023-02-04 ENCOUNTER — Ambulatory Visit
Admission: EM | Admit: 2023-02-04 | Discharge: 2023-02-04 | Disposition: A | Discharge status: Home / Self Care | Payer: Medicaid Other | Attending: Emergency Medicine | Admitting: Emergency Medicine

## 2023-02-04 ENCOUNTER — Encounter: Payer: Self-pay | Admitting: Emergency Medicine

## 2023-02-04 DIAGNOSIS — J069 Acute upper respiratory infection, unspecified: Secondary | ICD-10-CM

## 2023-02-04 LAB — POC SARS CORONAVIRUS 2 AG -  ED: SARS Coronavirus 2 Ag: NEGATIVE

## 2023-02-04 MED ORDER — FLUTICASONE PROPIONATE 50 MCG/ACT NA SUSP: 2.0000 | Freq: Every day | NASAL | 2 refills | Status: AC

## 2023-02-04 MED ORDER — PROMETHAZINE-DM 6.25-15 MG/5ML PO SYRP: 5.0000 mL | ORAL_SOLUTION | Freq: Four times a day (QID) | ORAL | 0 refills | Status: DC | PRN

## 2023-02-04 NOTE — Discharge Instructions (Addendum)
Rapid covid test today is negative  The promethazine cough syrup can be used 4 times daily. Please use caution as this medication can make you drowsy  Use tylenol for body aches, 500-650 mg every 4-6 hours  Make sure you are drinking lots of fluids Honey can help with cough as well You can also add a daily nasal spray such as flonase to help with nasal congestion.   Please return if needed

## 2023-02-04 NOTE — ED Triage Notes (Signed)
Pt c/o cough and body aches for two days. Taking OTC cough meds with no relief. Afebrile.

## 2023-02-04 NOTE — ED Provider Notes (Signed)
[facility]    CSN: [id number] Arrival date & time: 02/04/23  0945     History   Chief Complaint Chief Complaint  Patient presents with   Cough    HPI Richard Beltran is a 57 y.o. male.  2 day history of dry cough, nasal congestion, body aches He tried NyQuil last night which helped with sleep No fever or chills. Decreased appetite but tolerating fluids No shortness of breath or chest pain  Several sick contacts at work  Past Medical History:  Diagnosis Date   Back pain    Hyperlipidemia    Hypertension    Hypertension    Joint pain    Lumbar degenerative disc disease 10/29/2013   Post L4-L5 fusion.    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 Authorizing Provider  fluticasone (FLONASE) 50 MCG/ACT nasal spray Place 2 sprays into both nostrils daily. 02/04/23  Yes Drinda Belgard, Lurena Joiner, PA-C  promethazine-dextromethorphan (PROMETHAZINE-DM) 6.25-15 MG/5ML syrup Take 5 mLs by mouth 4 (four) times daily as needed for cough. 02/04/23  Yes Young Mulvey, Lurena Joiner, PA-C  amLODipine (NORVASC) 10 MG tablet Take 1 tablet (10 mg total) by mouth daily. 01/11/23   Monica Becton, MD  carvedilol (COREG) 25 MG tablet Take 1 tablet (25 mg total) by mouth 2 (two) times daily  with a meal. 08/10/22   Monica Becton, MD  lisinopril-hydrochlorothiazide (ZESTORETIC) 20-25 MG tablet Take 1 tablet by mouth daily. 01/11/23   Monica Becton, MD  meloxicam [facility]) 15 MG tablet Take 1 tablet by mouth once daily 11/09/22   Monica Becton, MD  rosuvastatin (CRESTOR) 10 MG tablet Take 1 tablet (10 mg total) by mouth daily. 07/22/22   Monica Becton, MD  traMADol (ULTRAM) 50 MG tablet Take 2 tablets (100 mg total) by mouth 2 (two) times daily. 12/30/22   Monica Becton, MD  VENTOLIN HFA 108 (90 Base) MCG/ACT inhaler INHALE 2 PUFFS BY MOUTH EVERY 4 HOURS AS NEEDED 11/06/21   Jomarie Longs, PA-C    Family History Family History  Problem Relation Age of Onset   Diabetes Mother    Hyperlipidemia Mother    Hypertension Mother    Healthy Father    Hypertension Father    Healthy Maternal Grandmother    Healthy Maternal Grandfather    Healthy Paternal Grandmother    Healthy Paternal Grandfather    Colon cancer Neg Hx    Colon polyps Neg Hx    Esophageal cancer Neg Hx    Rectal cancer Neg Hx    Stomach cancer Neg Hx     Social History Social History   Tobacco Use   Smoking status: Never   Smokeless tobacco: Never  Vaping Use  Vaping status: Never Used  Substance Use Topics   Alcohol use: No   Drug use: No     Allergies   Hm lidocaine patch [lidocaine], Egg-derived products, and Flexeril [cyclobenzaprine]   Review of Systems Review of Systems  Respiratory:  Positive for cough.    Per HPI  Physical Exam Triage Vital Signs ED Triage Vitals [02/04/23 1002]  Encounter Vitals Group     BP (!) 156/92     Systolic BP Percentile      Diastolic BP Percentile      Pulse Rate 69     Resp 18     Temp 98 F (36.7 C)     Temp Source Oral     SpO2 98 %     Weight      Height      Head Circumference      Peak Flow      Pain Score 0     Pain Loc      Pain Education      Exclude from Growth Chart    No data  found.  Updated Vital Signs BP (!) 156/92 (BP Location: Right Arm)   Pulse 69   Temp 98 F (36.7 C) (Oral)   Resp 18   SpO2 98%    Physical Exam Vitals and nursing note reviewed.  Constitutional:      General: He is not in acute distress.    Appearance: Normal appearance.  HENT:     Right Ear: Tympanic membrane and ear canal normal.     Left Ear: Tympanic membrane and ear canal normal.     Nose: Congestion (mild) present. No rhinorrhea.     Mouth/Throat:     Mouth: Mucous membranes are moist.     Pharynx: Oropharynx is clear. No posterior oropharyngeal erythema.  Eyes:     Conjunctiva/sclera: Conjunctivae normal.  Cardiovascular:     Rate and Rhythm: Normal rate and regular rhythm.     Pulses: Normal pulses.     Heart sounds: Normal heart sounds.  Pulmonary:     Effort: Pulmonary effort is normal.     Breath sounds: Normal breath sounds.     Comments: Clear lungs throughout.  Frequent dry cough in clinic Musculoskeletal:     Cervical back: Normal range of motion.  Lymphadenopathy:     Cervical: No cervical adenopathy.  Skin:    General: Skin is warm and dry.  Neurological:     Mental Status: He is alert and oriented to person, place, and time.      UC Treatments / Results  Labs (all labs ordered are listed, but only abnormal results are displayed) Labs Reviewed  POC SARS CORONAVIRUS 2 AG -  ED    EKG   Radiology No results found.  Procedures Procedures (including critical care time)  Medications Ordered in UC Medications - No data to display  Initial Impression / Assessment and Plan / UC Course  I have reviewed the triage vital signs and the nursing notes.  Pertinent labs & imaging results that were available during my care of the patient were reviewed by me and considered in my medical decision making (see chart for details).  Rapid covid test negative. Likely viral etiology. Discussed symptomatic care at home. Promethazine DM and flonase sent to  pharmacy with drowsy precautions. Work note provided. Return precautions discussed, patient agreeable to plan   Final Clinical Impressions(s) / UC Diagnoses   Final diagnoses:  Viral URI with cough  Discharge Instructions      Rapid covid test today is negative  The promethazine cough syrup can be used 4 times daily. Please use caution as this medication can make you drowsy  Use tylenol for body aches, 500-650 mg every 4-6 hours  Make sure you are drinking lots of fluids Honey can help with cough as well You can also add a daily nasal spray such as flonase to help with nasal congestion.   Please return if needed     ED Prescriptions     Medication Sig Dispense Auth. Provider   promethazine-dextromethorphan (PROMETHAZINE-DM) 6.25-15 MG/5ML syrup Take 5 mLs by mouth 4 (four) times daily as needed for cough. 180 mL Sheehan Stacey, PA-C   fluticasone (FLONASE) 50 MCG/ACT nasal spray Place 2 sprays into both nostrils daily. 9.9 mL Rizwan Kuyper, Lurena Joiner, PA-C      PDMP not reviewed this encounter.   Marlow Baars, [place] 02/04/23 1141

## 2023-02-08 ENCOUNTER — Ambulatory Visit: Payer: Medicaid Other

## 2023-02-09 ENCOUNTER — Other Ambulatory Visit: Payer: Self-pay | Admitting: Sports Medicine

## 2023-02-09 DIAGNOSIS — M51369 Other intervertebral disc degeneration, lumbar region without mention of lumbar back pain or lower extremity pain: Secondary | ICD-10-CM

## 2023-02-22 ENCOUNTER — Ambulatory Visit: Payer: Medicaid Other

## 2023-02-23 ENCOUNTER — Ambulatory Visit (INDEPENDENT_AMBULATORY_CARE_PROVIDER_SITE_OTHER): Payer: Medicaid Other | Admitting: Sports Medicine

## 2023-02-23 VITALS — BP 154/77 | HR 77 | Ht 73.0 in | Wt 279.0 lb

## 2023-02-23 DIAGNOSIS — E291 Testicular hypofunction: Secondary | ICD-10-CM

## 2023-02-23 MED ORDER — TESTOSTERONE CYPIONATE 200 MG/ML IM SOLN
200.0000 mg | INTRAMUSCULAR | Status: AC
  Administered 2023-02-23 – 2024-06-08 (×23): 200 mg via INTRAMUSCULAR

## 2023-02-23 NOTE — Progress Notes (Unsigned)
Patient is here for a testosterone injection of 200mg.   Given in RUOQ.  Denies chest pain, shortness of breath, headaches and problems with medication or mood changes.   Tolerated injection well without complications.   Patient advised to schedule next injection in 14 days.  

## 2023-03-01 ENCOUNTER — Ambulatory Visit
Admission: EM | Admit: 2023-03-01 | Discharge: 2023-03-01 | Disposition: A | Discharge status: Home / Self Care | Payer: Medicaid Other | Attending: Family Medicine | Admitting: Family Medicine

## 2023-03-01 DIAGNOSIS — R059 Cough, unspecified: Secondary | ICD-10-CM

## 2023-03-01 DIAGNOSIS — J069 Acute upper respiratory infection, unspecified: Secondary | ICD-10-CM

## 2023-03-01 DIAGNOSIS — R52 Pain, unspecified: Secondary | ICD-10-CM

## 2023-03-01 LAB — POC SARS CORONAVIRUS 2 AG -  ED: SARS Coronavirus 2 Ag: NEGATIVE

## 2023-03-01 MED ORDER — PROMETHAZINE-DM 6.25-15 MG/5ML PO SYRP: 5.0000 mL | ORAL_SOLUTION | Freq: Two times a day (BID) | ORAL | 0 refills | Status: DC | PRN

## 2023-03-01 MED ORDER — BENZONATATE 200 MG PO CAPS: 200.0000 mg | ORAL_CAPSULE | Freq: Three times a day (TID) | ORAL | 0 refills | Status: AC | PRN

## 2023-03-01 MED ORDER — ACETAMINOPHEN 500 MG PO TABS
1000.0000 mg | ORAL_TABLET | Freq: Once | ORAL | Status: AC
  Administered 2023-03-01: 1000 mg via ORAL

## 2023-03-01 MED ORDER — PREDNISONE 20 MG PO TABS: ORAL_TABLET | ORAL | 0 refills | Status: DC

## 2023-03-01 MED ORDER — CEFDINIR 300 MG PO CAPS: 300.0000 mg | ORAL_CAPSULE | Freq: Two times a day (BID) | ORAL | 0 refills | Status: AC

## 2023-03-01 NOTE — ED Provider Notes (Signed)
[facility]    CSN: [id number] Arrival date & time: 03/01/23  0905      History   Chief Complaint Chief Complaint  Patient presents with   Cough   Generalized Body Aches   Fatigue    HPI Richard Beltran is a 57 y.o. male.   HPI 57 year old male presents with cough, generalized bodyaches, and fatigue since yesterday.  Patient reports was evaluated in urgent care on 10 4 and was treated with cough syrup.  PMH significant for morbid obesity, HTN, and shortness of breath on exertion.  Past Medical History:  Diagnosis Date   Back pain    Hyperlipidemia    Hypertension    Hypertension    Joint pain    Lumbar degenerative disc disease 10/29/2013   Post L4-L5 fusion.    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 Authorizing Provider  benzonatate (TESSALON) 200 MG capsule Take 1 capsule (200 mg total) by mouth 3 (three) times daily as needed for up to 7 days. 03/01/23 03/08/23 Yes Trevor Iha, FNP  cefdinir (OMNICEF) 300 MG capsule Take 1 capsule (300 mg total) by mouth 2 (two) times daily for 7 days. 03/01/23 03/08/23 Yes Trevor Iha, FNP  predniSONE (DELTASONE) 20 MG tablet Take 3 tabs PO daily x 5 days. 03/01/23  Yes Trevor Iha, FNP   promethazine-dextromethorphan (PROMETHAZINE-DM) 6.25-15 MG/5ML syrup Take 5 mLs by mouth 2 (two) times daily as needed for cough. 03/01/23  Yes Trevor Iha, FNP  amLODipine (NORVASC) 10 MG tablet Take 1 tablet (10 mg total) by mouth daily. 01/11/23   Monica Becton, MD  carvedilol (COREG) 25 MG tablet Take 1 tablet (25 mg total) by mouth 2 (two) times daily with a meal. 08/10/22   Monica Becton, MD  fluticasone (FLONASE) 50 MCG/ACT nasal spray Place 2 sprays into both nostrils daily. 02/04/23   Rising, Lurena Joiner, PA-C  lisinopril-hydrochlorothiazide (ZESTORETIC) 20-25 MG tablet Take 1 tablet by mouth daily. 01/11/23   Monica Becton, MD  meloxicam [facility]) 15 MG tablet Take 1 tablet by mouth once daily 02/10/23   Monica Becton, MD  rosuvastatin (CRESTOR) 10 MG tablet Take 1 tablet (10 mg total) by mouth daily. 07/22/22   Monica Becton, MD  traMADol (ULTRAM) 50 MG tablet Take 2 tablets (100 mg total) by mouth 2 (two) times daily. 12/30/22   Monica Becton, MD  VENTOLIN HFA 108 [phone] Base) MCG/ACT inhaler INHALE 2 PUFFS BY MOUTH EVERY 4 HOURS AS NEEDED 11/06/21   Jomarie Longs, PA-C    Family History Family History  Problem Relation Age of Onset   Diabetes Mother    Hyperlipidemia Mother    Hypertension Mother    Healthy Father  Hypertension Father    Healthy Maternal Grandmother    Healthy Maternal Grandfather    Healthy Paternal Grandmother    Healthy Paternal Grandfather    Colon cancer Neg Hx    Colon polyps Neg Hx    Esophageal cancer Neg Hx    Rectal cancer Neg Hx    Stomach cancer Neg Hx     Social History Social History   Tobacco Use   Smoking status: Never   Smokeless tobacco: Never  Vaping Use   Vaping status: Never Used  Substance Use Topics   Alcohol use: No   Drug use: No     Allergies   Hm lidocaine patch [lidocaine], Egg-derived products, and Flexeril [cyclobenzaprine]   Review of Systems Review of Systems   Constitutional:  Positive for fatigue.  Respiratory:  Positive for cough.   Musculoskeletal:  Positive for arthralgias and myalgias.  All other systems reviewed and are negative.    Physical Exam Triage Vital Signs ED Triage Vitals  Encounter Vitals Group     BP 03/01/23 0958 (!) 173/91     Systolic BP Percentile --      Diastolic BP Percentile --      Pulse Rate 03/01/23 0958 77     Resp 03/01/23 0958 17     Temp 03/01/23 0958 97.9 F (36.6 C)     Temp Source 03/01/23 0958 Oral     SpO2 03/01/23 0958 98 %     Weight --      Height --      Head Circumference --      Peak Flow --      Pain Score 03/01/23 0959 3     Pain Loc --      Pain Education --      Exclude from Growth Chart --    No data found.  Updated Vital Signs BP (!) 173/91 (BP Location: Left Arm)   Pulse 77   Temp 97.9 F (36.6 C) (Oral)   Resp 17   SpO2 98%       Physical Exam Vitals and nursing note reviewed.  Constitutional:      Appearance: Normal appearance. He is obese. He is ill-appearing.  HENT:     Head: Normocephalic and atraumatic.     Right Ear: Tympanic membrane, ear canal and external ear normal.     Left Ear: Tympanic membrane, ear canal and external ear normal.     Mouth/Throat:     Mouth: Mucous membranes are moist.     Pharynx: Oropharynx is clear.  Eyes:     Extraocular Movements: Extraocular movements intact.     Conjunctiva/sclera: Conjunctivae normal.     Pupils: Pupils are equal, round, and reactive to light.  Cardiovascular:     Rate and Rhythm: Normal rate and regular rhythm.     Pulses: Normal pulses.     Heart sounds: Murmur heard.  Pulmonary:     Effort: Pulmonary effort is normal.     Breath sounds: Normal breath sounds. No wheezing, rhonchi or rales.     Comments: Inrequent nonproductive cough on exam, diminished breath sounds over bases Musculoskeletal:        General: Normal range of motion.     Cervical back: Normal range of motion and neck supple.   Skin:    General: Skin is warm and dry.  Neurological:     General: No focal deficit present.     Mental Status: He is alert and oriented to person,  place, and time. Mental status is at baseline.      UC Treatments / Results  Labs (all labs ordered are listed, but only abnormal results are displayed) Labs Reviewed  POC SARS CORONAVIRUS 2 AG -  ED    EKG   Radiology No results found.  Procedures Procedures (including critical care time)  Medications Ordered in UC Medications  acetaminophen (TYLENOL) tablet 1,000 mg (1,000 mg Oral Given 03/01/23 1012)    Initial Impression / Assessment and Plan / UC Course  I have reviewed the triage vital signs and the nursing notes.  Pertinent labs & imaging results that were available during my care of the patient were reviewed by me and considered in my medical decision making (see chart for details).     MDM: 1.  Acute upper respiratory infection-Rx'd Cefdinir generalized body aches-Tylenol 1 g given once in clinic and prior to discharge. 2.  Cough, unspecified type-Rx'd Prednisone 60 mg daily x 5 days, Tessalon 200 mg capsule: Take 1 capsule 3 times daily, as needed for cough, Rx'd Promethazine DM 6.25-15 mg / 5 mL syrup: Take 5 mL by mouth 2 times daily as needed for cough. Advised patient to take medications as directed with food to completion.  Advised patient to take prednisone with first dose of cefdinir for the next 5 of 7 days.  Advised may use Tessalon capsules daily or as needed for cough.  Advised may use Promethazine DM at night for cough due to sedative effects.  Encouraged to increase daily water intake to 64 ounces per day while taking these medications.  Advised if symptoms worsen and/or unresolved please follow-up with PCP or here for further evaluation.  Work note provided to patient prior to discharge today per request.  Patient discharged home, hemodynamically stable. Final Clinical Impressions(s) / UC Diagnoses    Final diagnoses:  Generalized body aches  Acute upper respiratory infection  Cough, unspecified type     Discharge Instructions      Advised patient to take medications as directed with food to completion.  Advised patient to take prednisone with first dose of cefdinir for the next 5 of 7 days.  Advised may use Tessalon capsules daily or as needed for cough.  Advised may use Promethazine DM at night for cough due to sedative effects.  Encouraged to increase daily water intake to 64 ounces per day while taking these medications.  Advised if symptoms worsen and/or unresolved please follow-up with PCP or here for further evaluation.     ED Prescriptions     Medication Sig Dispense Auth. Provider   cefdinir (OMNICEF) 300 MG capsule Take 1 capsule (300 mg total) by mouth 2 (two) times daily for 7 days. 14 capsule Trevor Iha, FNP   predniSONE (DELTASONE) 20 MG tablet Take 3 tabs PO daily x 5 days. 15 tablet Trevor Iha, FNP   benzonatate (TESSALON) 200 MG capsule Take 1 capsule (200 mg total) by mouth 3 (three) times daily as needed for up to 7 days. 40 capsule Trevor Iha, FNP   promethazine-dextromethorphan (PROMETHAZINE-DM) 6.25-15 MG/5ML syrup Take 5 mLs by mouth 2 (two) times daily as needed for cough. 118 mL Trevor Iha, FNP      PDMP not reviewed this encounter.   Trevor Iha, FNP 03/01/23 1118

## 2023-03-01 NOTE — Discharge Instructions (Addendum)
Advised patient to take medications as directed with food to completion.  Advised patient to take prednisone with first dose of cefdinir for the next 5 of 7 days.  Advised may use Tessalon capsules daily or as needed for cough.  Advised may use Promethazine DM at night for cough due to sedative effects.  Encouraged to increase daily water intake to 64 ounces per day while taking these medications.  Advised if symptoms worsen and/or unresolved please follow-up with PCP or here for further evaluation.

## 2023-03-01 NOTE — ED Triage Notes (Signed)
Pt c/o cough, fatigue and bodyaches since yesterday. Was seen in UC on 10/04 and tx with cough syrup. COVID was neg. Nyquil prn

## 2023-03-04 DIAGNOSIS — Z419 Encounter for procedure for purposes other than remedying health state, unspecified: Secondary | ICD-10-CM | POA: Diagnosis not present

## 2023-03-08 ENCOUNTER — Ambulatory Visit: Payer: Medicaid Other

## 2023-03-11 ENCOUNTER — Ambulatory Visit (INDEPENDENT_AMBULATORY_CARE_PROVIDER_SITE_OTHER): Payer: BC Managed Care – PPO

## 2023-03-11 VITALS — BP 167/95 | HR 82 | Ht 73.0 in | Wt 279.8 lb

## 2023-03-11 DIAGNOSIS — E291 Testicular hypofunction: Secondary | ICD-10-CM

## 2023-03-11 NOTE — Progress Notes (Unsigned)
(  [facility] AMB POC)  HPI Patient here for testosterone injection.Denies chest pain, shortness of breathe, headaches and problems with medication or mood changes.            Assessment and plan:  Patient tolerated injection well without complications. Patient advised to schedule next injection in (14 days) around 03/25/23.

## 2023-03-22 ENCOUNTER — Ambulatory Visit: Payer: Medicaid Other

## 2023-03-29 ENCOUNTER — Other Ambulatory Visit: Payer: Self-pay

## 2023-03-29 ENCOUNTER — Other Ambulatory Visit: Payer: Self-pay | Admitting: Sports Medicine

## 2023-03-29 DIAGNOSIS — M51369 Other intervertebral disc degeneration, lumbar region without mention of lumbar back pain or lower extremity pain: Secondary | ICD-10-CM

## 2023-03-29 MED ORDER — TRAMADOL HCL 50 MG PO TABS: 100.0000 mg | ORAL_TABLET | Freq: Two times a day (BID) | ORAL | 0 refills | Status: DC

## 2023-03-29 NOTE — Telephone Encounter (Signed)
Copied from CRM [id number]. Topic: Clinical - Medication Refill >> Mar 29, 2023  1:23 PM Maxwell Marion wrote: Most Recent Primary Care Visit:  Provider: Roselyn Reef  Department: [facility]  Visit Type: NURSE VISIT  Date: 03/11/2023  Medication: traMADol (ULTRAM) 50 MG tablet  Has the patient contacted their pharmacy? Yes (Agent: If no, request that the patient contact the pharmacy for the refill. If patient does not wish to contact the pharmacy document the reason why and proceed with request.) (Agent: If yes, when and what did the pharmacy advise?)  Is this the correct pharmacy for this prescription?  If no, delete pharmacy and type the correct one.  This is the patient's preferred pharmacy:  [facility] [address] [place] - [address] [place]  [place] [address] Phone: [phone] Fax: [phone]    Has the prescription been filled recently?   Is the patient out of the medication?   Has the patient been seen for an appointment in the last year OR does the patient have an upcoming appointment?   Can we respond through MyChart?   Agent: Please be advised that Rx refills may take up to 3 business days. We ask that you follow-up with your pharmacy.

## 2023-04-03 DIAGNOSIS — Z419 Encounter for procedure for purposes other than remedying health state, unspecified: Secondary | ICD-10-CM | POA: Diagnosis not present

## 2023-04-05 ENCOUNTER — Ambulatory Visit: Payer: Medicaid Other

## 2023-04-20 ENCOUNTER — Ambulatory Visit (INDEPENDENT_AMBULATORY_CARE_PROVIDER_SITE_OTHER): Payer: BC Managed Care – PPO | Admitting: Sports Medicine

## 2023-04-20 VITALS — BP 137/69 | HR 85 | Ht 73.0 in

## 2023-04-20 DIAGNOSIS — E291 Testicular hypofunction: Secondary | ICD-10-CM

## 2023-04-20 DIAGNOSIS — M51369 Other intervertebral disc degeneration, lumbar region without mention of lumbar back pain or lower extremity pain: Secondary | ICD-10-CM

## 2023-04-20 MED ORDER — TESTOSTERONE CYPIONATE 200 MG/ML IM SOLN
200.0000 mg | Freq: Once | INTRAMUSCULAR | Status: AC
  Administered 2023-04-20: 200 mg via INTRAMUSCULAR

## 2023-04-20 MED ORDER — MELOXICAM 15 MG PO TABS: 15.0000 mg | ORAL_TABLET | Freq: Every day | ORAL | 0 refills | Status: DC

## 2023-04-20 NOTE — Progress Notes (Signed)
Pt here for testosterone injection no SOB,CP or mood swings. Injection tolerated well given in RUOQ. Pt will RTC in 2 weeks for next injection.

## 2023-04-29 ENCOUNTER — Ambulatory Visit
Admission: EM | Admit: 2023-04-29 | Discharge: 2023-04-29 | Disposition: A | Discharge status: Home / Self Care | Payer: Commercial Managed Care - HMO | Attending: Family Medicine | Admitting: Family Medicine

## 2023-04-29 DIAGNOSIS — R52 Pain, unspecified: Secondary | ICD-10-CM | POA: Diagnosis not present

## 2023-04-29 DIAGNOSIS — J069 Acute upper respiratory infection, unspecified: Secondary | ICD-10-CM | POA: Diagnosis not present

## 2023-04-29 DIAGNOSIS — R059 Cough, unspecified: Secondary | ICD-10-CM

## 2023-04-29 MED ORDER — BENZONATATE 200 MG PO CAPS: 200.0000 mg | ORAL_CAPSULE | Freq: Three times a day (TID) | ORAL | 0 refills | Status: AC | PRN

## 2023-04-29 MED ORDER — PROMETHAZINE-DM 6.25-15 MG/5ML PO SYRP: 5.0000 mL | ORAL_SOLUTION | Freq: Two times a day (BID) | ORAL | 0 refills | Status: DC | PRN

## 2023-04-29 MED ORDER — PREDNISONE 20 MG PO TABS: ORAL_TABLET | ORAL | 0 refills | Status: DC

## 2023-04-29 MED ORDER — CEFDINIR 300 MG PO CAPS: 300.0000 mg | ORAL_CAPSULE | Freq: Two times a day (BID) | ORAL | 0 refills | Status: AC

## 2023-04-29 NOTE — Discharge Instructions (Addendum)
Advised patient to take medications as directed with food to completion.  Advised patient to take prednisone with first dose of cefdinir for the next 5 of 7 days.  Advised may take Tessalon capsules daily or as needed for cough.  Advised may use Promethazine DM at night for cough prior to sleep due to sedative effects.  Encouraged to increase daily water intake to 64 ounces per day while taking these medications.  Advised if symptoms worsen and/or unresolved please follow-up PCP or here for further evaluation.

## 2023-04-29 NOTE — ED Triage Notes (Signed)
Pt c/o cough and bodyaches x 3 days. Denies fever. No known covid or flu exposure. Taking tylenol and nyquil prn.

## 2023-04-29 NOTE — ED Provider Notes (Signed)
[facility]    CSN: [id number] Arrival date & time: 04/29/23  1024      History   Chief Complaint Chief Complaint  Patient presents with   Cough   Generalized Body Aches    HPI Richard Beltran is a 57 y.o. male.   HPI 57 year old male presents with cough and generalized bodyaches for 3 days.  Denies exposure to COVID-19 or flu.  PMH significant for morbid obesity, HTN, and HLD.  Past Medical History:  Diagnosis Date   Back pain    Hyperlipidemia    Hypertension    Hypertension    Joint pain    Lumbar degenerative disc disease 10/29/2013   Post L4-L5 fusion.    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 Authorizing Provider  benzonatate (TESSALON) 200 MG capsule Take 1 capsule (200 mg total) by mouth 3 (three) times daily as needed for up to 7 days. 04/29/23 05/06/23 Yes Trevor Iha, FNP  cefdinir (OMNICEF) 300 MG capsule Take 1 capsule (300 mg total) by mouth 2 (two) times daily for 7 days. 04/29/23 05/06/23 Yes Trevor Iha, FNP  predniSONE (DELTASONE) 20 MG tablet Take 3 tabs PO daily x 5 days. 04/29/23  Yes Trevor Iha, FNP  promethazine-dextromethorphan (PROMETHAZINE-DM) 6.25-15 MG/5ML syrup Take 5 mLs by mouth 2 (two) times daily as needed for  cough. 04/29/23  Yes Trevor Iha, FNP  amLODipine (NORVASC) 10 MG tablet Take 1 tablet (10 mg total) by mouth daily. 01/11/23   Monica Becton, MD  carvedilol (COREG) 25 MG tablet Take 1 tablet (25 mg total) by mouth 2 (two) times daily with a meal. 08/10/22   Monica Becton, MD  fluticasone (FLONASE) 50 MCG/ACT nasal spray Place 2 sprays into both nostrils daily. 02/04/23   Rising, Lurena Joiner, PA-C  lisinopril-hydrochlorothiazide (ZESTORETIC) 20-25 MG tablet Take 1 tablet by mouth daily. 01/11/23   Monica Becton, MD  meloxicam (MOBIC) 15 MG tablet Take 1 tablet (15 mg total) by mouth daily. 04/20/23   Monica Becton, MD  rosuvastatin (CRESTOR) 10 MG tablet Take 1 tablet (10 mg total) by mouth daily. 07/22/22   Monica Becton, MD  traMADol (ULTRAM) 50 MG tablet Take 2 tablets (100 mg total) by mouth 2 (two) times daily. 03/29/23   Monica Becton, MD    Family History Family History  Problem Relation Age of Onset   Diabetes Mother    Hyperlipidemia Mother    Hypertension Mother    Healthy Father    Hypertension Father    Healthy Maternal Grandmother    Healthy Maternal Grandfather    Healthy Paternal Grandmother    Healthy Paternal Grandfather    Colon cancer Neg Hx    Colon polyps  Neg Hx    Esophageal cancer Neg Hx    Rectal cancer Neg Hx    Stomach cancer Neg Hx     Social History Social History   Tobacco Use   Smoking status: Never   Smokeless tobacco: Never  Vaping Use   Vaping status: Never Used  Substance Use Topics   Alcohol use: No   Drug use: No     Allergies   Hm lidocaine patch [lidocaine], Egg-derived products, and Flexeril [cyclobenzaprine]   Review of Systems Review of Systems  Constitutional:  Positive for fatigue.  HENT:  Positive for congestion.   Respiratory:  Positive for cough.   Musculoskeletal:  Positive for arthralgias and myalgias.  All other systems reviewed and are negative.    Physical  Exam Triage Vital Signs ED Triage Vitals  Encounter Vitals Group     BP 04/29/23 1151 (!) 145/74     Systolic BP Percentile --      Diastolic BP Percentile --      Pulse Rate 04/29/23 1151 74     Resp 04/29/23 1151 17     Temp 04/29/23 1151 98.3 F (36.8 C)     Temp Source 04/29/23 1151 Oral     SpO2 04/29/23 1151 98 %     Weight --      Height --      Head Circumference --      Peak Flow --      Pain Score 04/29/23 1152 0     Pain Loc --      Pain Education --      Exclude from Growth Chart --    No data found.  Updated Vital Signs BP (!) 145/74 (BP Location: Right Arm)   Pulse 74   Temp 98.3 F (36.8 C) (Oral)   Resp 17   SpO2 98%    Physical Exam Vitals and nursing note reviewed.  Constitutional:      General: He is not in acute distress.    Appearance: Normal appearance. He is obese. He is ill-appearing.  HENT:     Head: Normocephalic and atraumatic.     Right Ear: Tympanic membrane, ear canal and external ear normal.     Left Ear: Tympanic membrane, ear canal and external ear normal.     Mouth/Throat:     Mouth: Mucous membranes are moist.     Pharynx: Oropharynx is clear.  Eyes:     Extraocular Movements: Extraocular movements intact.     Conjunctiva/sclera: Conjunctivae normal.     Pupils: Pupils are equal, round, and reactive to light.  Cardiovascular:     Rate and Rhythm: Normal rate and regular rhythm.     Pulses: Normal pulses.     Heart sounds: Normal heart sounds.  Pulmonary:     Effort: Pulmonary effort is normal.     Breath sounds: Normal breath sounds. No wheezing, rhonchi or rales.     Comments: Infrequent nonproductive cough noted on exam Musculoskeletal:        General: Normal range of motion.     Cervical back: Normal range of motion and neck supple.  Skin:    General: Skin is warm and dry.  Neurological:     General: No focal deficit present.     Mental Status: He is alert and oriented to person, place, and time. Mental status is at  baseline.  Psychiatric:        Mood and Affect: Mood normal.  Behavior: Behavior normal.      UC Treatments / Results  Labs (all labs ordered are listed, but only abnormal results are displayed) Labs Reviewed - No data to display  EKG   Radiology No results found.  Procedures Procedures (including critical care time)  Medications Ordered in UC Medications - No data to display  Initial Impression / Assessment and Plan / UC Course  I have reviewed the triage vital signs and the nursing notes.  Pertinent labs & imaging results that were available during my care of the patient were reviewed by me and considered in my medical decision making (see chart for details).     MDM: 1.  Upper respiratory tract infection, unspecified type-Rx'd cefdinir 300 mg capsule: Take 1 capsule twice daily x 7 days; 2.  Cough, unspecified type-Rx'd Tessalon 200 mg capsules: Take 1 capsule 3 times daily, as needed for cough, Promethazine DM 6.25-15 Mg/5 mL syrup: Take 5 mL twice daily, as needed for cough; 3.  Generalized body aches-Rx'd prednisone 20 mg tablet: Take 3 tabs p.o. daily x 5 days. Advised patient to take medications as directed with food to completion.  Advised patient to take prednisone with first dose of cefdinir for the next 5 of 7 days.  Advised may take Tessalon capsules daily or as needed for cough.  Advised may use Promethazine DM at night for cough prior to sleep due to sedative effects.  Encouraged to increase daily water intake to 64 ounces per day while taking these medications.  Advised if symptoms worsen and/or unresolved please follow-up PCP or here for further evaluation.  Final Clinical Impressions(s) / UC Diagnoses   Final diagnoses:  Generalized body aches  Cough, unspecified type  Upper respiratory tract infection, unspecified type     Discharge Instructions      Advised patient to take medications as directed with food to completion.  Advised patient to take  prednisone with first dose of cefdinir for the next 5 of 7 days.  Advised may take Tessalon capsules daily or as needed for cough.  Advised may use Promethazine DM at night for cough prior to sleep due to sedative effects.  Encouraged to increase daily water intake to 64 ounces per day while taking these medications.  Advised if symptoms worsen and/or unresolved please follow-up PCP or here for further evaluation.     ED Prescriptions     Medication Sig Dispense Auth. Provider   cefdinir (OMNICEF) 300 MG capsule Take 1 capsule (300 mg total) by mouth 2 (two) times daily for 7 days. 14 capsule Trevor Iha, FNP   predniSONE (DELTASONE) 20 MG tablet Take 3 tabs PO daily x 5 days. 15 tablet Trevor Iha, FNP   benzonatate (TESSALON) 200 MG capsule Take 1 capsule (200 mg total) by mouth 3 (three) times daily as needed for up to 7 days. 40 capsule Trevor Iha, FNP   promethazine-dextromethorphan (PROMETHAZINE-DM) 6.25-15 MG/5ML syrup Take 5 mLs by mouth 2 (two) times daily as needed for cough. 118 mL Trevor Iha, FNP      PDMP not reviewed this encounter.   Trevor Iha, FNP 04/29/23 1225

## 2023-05-04 DIAGNOSIS — Z419 Encounter for procedure for purposes other than remedying health state, unspecified: Secondary | ICD-10-CM | POA: Diagnosis not present

## 2023-05-06 ENCOUNTER — Ambulatory Visit: Payer: Medicaid Other

## 2023-06-04 DIAGNOSIS — Z419 Encounter for procedure for purposes other than remedying health state, unspecified: Secondary | ICD-10-CM | POA: Diagnosis not present

## 2023-06-08 ENCOUNTER — Encounter: Payer: Self-pay | Admitting: Sports Medicine

## 2023-06-08 ENCOUNTER — Other Ambulatory Visit: Payer: Self-pay | Admitting: *Deleted

## 2023-06-08 ENCOUNTER — Ambulatory Visit (INDEPENDENT_AMBULATORY_CARE_PROVIDER_SITE_OTHER): Payer: Commercial Managed Care - HMO

## 2023-06-08 VITALS — BP 142/78 | HR 66 | Ht 73.0 in | Wt 287.0 lb

## 2023-06-08 DIAGNOSIS — E291 Testicular hypofunction: Secondary | ICD-10-CM | POA: Diagnosis not present

## 2023-06-08 DIAGNOSIS — M51369 Other intervertebral disc degeneration, lumbar region without mention of lumbar back pain or lower extremity pain: Secondary | ICD-10-CM

## 2023-06-08 MED ORDER — TESTOSTERONE CYPIONATE 200 MG/ML IM SOLN
200.0000 mg | Freq: Once | INTRAMUSCULAR | Status: AC
  Administered 2023-06-08: 200 mg via INTRAMUSCULAR

## 2023-06-08 NOTE — Addendum Note (Signed)
 Addended by: Izora Marten on: 06/08/2023 05:16 PM   Modules accepted: Orders

## 2023-06-08 NOTE — Progress Notes (Signed)
 Pt here for testosterone injection.  No SOB,CP or mood swings.   Injection tolerated well given in LUOQ. Pt will RTC in 2 weeks for next injection.

## 2023-06-09 MED ORDER — TRAMADOL HCL 50 MG PO TABS: 100.0000 mg | ORAL_TABLET | Freq: Two times a day (BID) | ORAL | 0 refills | Status: DC

## 2023-06-10 ENCOUNTER — Telehealth: Payer: Self-pay

## 2023-06-10 NOTE — Telephone Encounter (Signed)
 Appointment is good, also please let him know to look up GoodRx, he can get a coupon and purchase it cash for cheaper especially if he does not want to wait for a PA.  https://[email or web address].com/tramadol ?label_[email or web address] &form=tablet&dosage=50mg &quantity=120&drugId=27161

## 2023-06-10 NOTE — Telephone Encounter (Signed)
 Copied from CRM [id number]. Topic: Clinical - Medication Question >> Jun 10, 2023 10:10 AM Hilton Lucky wrote: Reason for CRM: Patient calling to request a PA for Tramadol  be initiated, per pharmacy instruction.

## 2023-06-10 NOTE — Telephone Encounter (Signed)
 Initiated PA for tramadol  but patient has not had OV addressing chronic pain for over one  year since we need this note to send with PA -  Patient was informed that appointment was needed and scheduled for 06/14/23.

## 2023-06-10 NOTE — Telephone Encounter (Signed)
 Patient informed. He will check into good rx and will let us  know if we need to send a new  prescription to a different pharmacy or if script can be transferred to his preferred  choice.

## 2023-06-14 ENCOUNTER — Encounter: Payer: Self-pay | Admitting: Sports Medicine

## 2023-06-14 ENCOUNTER — Ambulatory Visit (INDEPENDENT_AMBULATORY_CARE_PROVIDER_SITE_OTHER): Payer: BC Managed Care – PPO | Admitting: Sports Medicine

## 2023-06-14 DIAGNOSIS — E6609 Other obesity due to excess calories: Secondary | ICD-10-CM | POA: Diagnosis not present

## 2023-06-14 DIAGNOSIS — M51369 Other intervertebral disc degeneration, lumbar region without mention of lumbar back pain or lower extremity pain: Secondary | ICD-10-CM

## 2023-06-14 DIAGNOSIS — M51362 Other intervertebral disc degeneration, lumbar region with discogenic back pain and lower extremity pain: Secondary | ICD-10-CM | POA: Diagnosis not present

## 2023-06-14 MED ORDER — GABAPENTIN 300 MG PO CAPS: ORAL_CAPSULE | ORAL | 3 refills | Status: AC

## 2023-06-14 MED ORDER — PREDNISONE 50 MG PO TABS: ORAL_TABLET | ORAL | 0 refills | Status: DC

## 2023-06-14 MED ORDER — TRAMADOL HCL 50 MG PO TABS: 100.0000 mg | ORAL_TABLET | Freq: Two times a day (BID) | ORAL | 3 refills | Status: DC

## 2023-06-14 NOTE — Progress Notes (Signed)
    Procedures performed today:    None.  Independent interpretation of notes and tests performed by another provider:   None.  Brief History, Exam, Impression, and Recommendations:    Lumbar degenerative disc disease Pleasant 58 year old male, history of L4-L5 fusion, more recently he is increasing back pain, axial, right sided with radiation down the right leg to the great toe in an L4 distribution. He was seen 3 months ago in the emergency department, ultimately x-rays and a CT were done that showed a stable effusion but mild adjacent level disease, no level by level description was given. He was treated with medication and conservative treatment, he returns to me now 3 months later with persistent discomfort. No red flag symptoms. Adding 5 days of prednisone, gabapentin at night, formal physical therapy. Due to over 3 months of conservative treatment from the ED without sufficient improvement we will also proceed with MRI for epidural planning. Return to see me in 6 weeks.  Obesity Karin does have obesity, we need to treat this to gain better control over his back pain, after we get his back doing a lot better I am happy to address this, we will likely use a GLP-1 in conjunction with diet and exercise.    ____________________________________________ Ihor Austin. Benjamin Stain, M.D., ABFM., CAQSM., AME. Primary Care and Sports Medicine  [facility]  Adjunct Professor of Family Medicine  [employer]  Restaurant manager, fast food

## 2023-06-14 NOTE — Assessment & Plan Note (Signed)
Pleasant 58 year old male, history of L4-L5 fusion, more recently he is increasing back pain, axial, right sided with radiation down the right leg to the great toe in an L4 distribution. He was seen 3 months ago in the emergency department, ultimately x-rays and a CT were done that showed a stable effusion but mild adjacent level disease, no level by level description was given. He was treated with medication and conservative treatment, he returns to me now 3 months later with persistent discomfort. No red flag symptoms. Adding 5 days of prednisone, gabapentin at night, formal physical therapy. Due to over 3 months of conservative treatment from the ED without sufficient improvement we will also proceed with MRI for epidural planning. Return to see me in 6 weeks.

## 2023-06-14 NOTE — Assessment & Plan Note (Signed)
Richard Beltran does have obesity, we need to treat this to gain better control over his back pain, after we get his back doing a lot better I am happy to address this, we will likely use a GLP-1 in conjunction with diet and exercise.

## 2023-06-15 ENCOUNTER — Ambulatory Visit: Payer: BC Managed Care – PPO

## 2023-06-15 ENCOUNTER — Other Ambulatory Visit: Payer: Self-pay

## 2023-06-15 ENCOUNTER — Ambulatory Visit
Admission: EM | Admit: 2023-06-15 | Discharge: 2023-06-15 | Disposition: A | Discharge status: Home / Self Care | Payer: BC Managed Care – PPO | Attending: Physician Assistant | Admitting: Physician Assistant

## 2023-06-15 DIAGNOSIS — S9031XA Contusion of right foot, initial encounter: Secondary | ICD-10-CM | POA: Diagnosis not present

## 2023-06-15 DIAGNOSIS — S99921A Unspecified injury of right foot, initial encounter: Secondary | ICD-10-CM | POA: Diagnosis not present

## 2023-06-15 DIAGNOSIS — Z0489 Encounter for examination and observation for other specified reasons: Secondary | ICD-10-CM | POA: Diagnosis not present

## 2023-06-15 MED ORDER — ACETAMINOPHEN 500 MG PO TABS
1000.0000 mg | ORAL_TABLET | Freq: Once | ORAL | Status: AC
Start: 2023-06-15 — End: 2023-06-15
  Administered 2023-06-15: 1000 mg via ORAL

## 2023-06-15 NOTE — ED Provider Notes (Signed)
[facility]    CSN: [id number] Arrival date & time: 06/15/23  1718      History   Chief Complaint No chief complaint on file.   HPI Richard Beltran is a 58 y.o. male.   Patient reports he dropped a piece of metal on his right foot at the base of his right first toe yesterday.  Patient complains of swelling and pain.  Patient is concerned that he could have broken something.  Patient states he has not taken any medication for pain.  Patient denies any other area of injury     Past Medical History:  Diagnosis Date   Back pain    Hyperlipidemia    Hypertension    Hypertension    Joint pain    Lumbar degenerative disc disease 10/29/2013   Post L4-L5 fusion.    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 Authorizing Provider  amLODipine (NORVASC) 10 MG tablet Take 1 tablet (10 mg total) by mouth daily. 01/11/23   Monica Becton, MD  carvedilol (COREG) 25 MG tablet Take 1 tablet (25 mg total) by mouth 2 (two) times daily with a meal. 08/10/22   Monica Becton, MD  fluticasone (FLONASE) 50 MCG/ACT nasal spray Place 2 sprays into both nostrils daily. 02/04/23   Rising, Lurena Joiner, PA-C  gabapentin (NEURONTIN) 300 MG capsule One tab PO qHS for a week, then  BID for a week, then TID. May double weekly to a max of 3,600mg /day 06/14/23   Monica Becton, MD  lisinopril-hydrochlorothiazide (ZESTORETIC) 20-25 MG tablet Take 1 tablet by mouth daily. 01/11/23   Monica Becton, MD  meloxicam (MOBIC) 15 MG tablet Take 1 tablet (15 mg total) by mouth daily. 04/20/23   Monica Becton, MD  predniSONE (DELTASONE) 50 MG tablet One tab PO daily for 5 days. 06/14/23   Monica Becton, MD  rosuvastatin (CRESTOR) 10 MG tablet Take 1 tablet (10 mg total) by mouth daily. 07/22/22   Monica Becton, MD  traMADol (ULTRAM) 50 MG tablet Take 2 tablets (100 mg total) by mouth 2 (two) times daily. 06/14/23   Monica Becton, MD    Family History Family History  Problem Relation Age of Onset   Diabetes Mother    Hyperlipidemia Mother    Hypertension Mother    Healthy Father    Hypertension Father    Healthy Maternal Grandmother    Healthy Maternal Grandfather    Healthy Paternal Grandmother    Healthy Paternal Grandfather    Colon cancer Neg Hx    Colon polyps Neg Hx    Esophageal cancer Neg Hx    Rectal cancer Neg Hx    Stomach cancer Neg Hx  Social History Social History   Tobacco Use   Smoking status: Never   Smokeless tobacco: Never  Vaping Use   Vaping status: Never Used  Substance Use Topics   Alcohol use: No   Drug use: No     Allergies   Egg-derived products and Flexeril [cyclobenzaprine]   Review of Systems Review of Systems  All other systems reviewed and are negative.    Physical Exam Triage Vital Signs ED Triage Vitals  Encounter Vitals Group     BP 06/15/23 1754 (!) 163/92     Systolic BP Percentile --      Diastolic BP Percentile --      Pulse Rate 06/15/23 1754 79     Resp 06/15/23 1754 18     Temp 06/15/23 1754 (!) 97.5 F (36.4 C)     Temp src --      SpO2 06/15/23 1754 98 %     Weight --      Height --      Head Circumference --      Peak Flow --      Pain Score  06/15/23 1757 10     Pain Loc --      Pain Education --      Exclude from Growth Chart --    No data found.  Updated Vital Signs BP (!) 163/92   Pulse 79   Temp (!) 97.5 F (36.4 C)   Resp 18   SpO2 98%   Visual Acuity Right Eye Distance:   Left Eye Distance:   Bilateral Distance:    Right Eye Near:   Left Eye Near:    Bilateral Near:     Physical Exam Vitals and nursing note reviewed.  Constitutional:      Appearance: He is well-developed.  HENT:     Head: Normocephalic.  Cardiovascular:     Rate and Rhythm: Normal rate.  Pulmonary:     Effort: Pulmonary effort is normal.  Abdominal:     General: There is no distension.  Musculoskeletal:        General: Swelling and tenderness present.     Comments: Swollen bruised area over right first metacarpal joint tender to palpation neurovascular neurosensory intact  Skin:    General: Skin is warm.  Neurological:     General: No focal deficit present.     Mental Status: He is alert and oriented to person, place, and time.      UC Treatments / Results  Labs (all labs ordered are listed, but only abnormal results are displayed) Labs Reviewed - No data to display  EKG   Radiology DG Foot Complete Right Result Date: 06/15/2023 CLINICAL DATA:  Dropped piece of metal on foot. EXAM: RIGHT FOOT COMPLETE - 3+ VIEW COMPARISON:  None Available. FINDINGS: There is no evidence of fracture or dislocation. There is no evidence of arthropathy or other focal bone abnormality. Soft tissues are unremarkable. IMPRESSION: Negative. Electronically Signed   By: Charlett Nose M.D.   On: 06/15/2023 19:23    Procedures Procedures (including critical care time)  Medications Ordered in UC Medications  acetaminophen (TYLENOL) tablet 1,000 mg (1,000 mg Oral Given 06/15/23 1833)    Initial Impression / Assessment and Plan / UC Course  I have reviewed the triage vital signs and the nursing notes.  Pertinent labs & imaging results that  were available during my care of the patient were reviewed by me and considered in my medical decision making (see chart  for details).     X-ray shows no evidence of fracture.  Patient is advised Tylenol ice to the area of pain follow-up with your primary care physician for recheck. Final Clinical Impressions(s) / UC Diagnoses   Final diagnoses:  Contusion of right foot, initial encounter     Discharge Instructions      Tylenol every 4 hours for symptoms.  Return if any problems.    ED Prescriptions   None    PDMP not reviewed this encounter. An After Visit Summary was printed and given to the patient.       Elson Areas, [place] 06/15/23 1308

## 2023-06-15 NOTE — ED Triage Notes (Signed)
Right foot pain after dropping metal on foot, yesterday. Has been soaking in epsom salt. Took aleve earlier this morning.

## 2023-06-15 NOTE — Discharge Instructions (Signed)
Tylenol every 4 hours for symptoms.  Return if any problems.

## 2023-06-22 ENCOUNTER — Ambulatory Visit: Payer: BC Managed Care – PPO

## 2023-06-29 ENCOUNTER — Ambulatory Visit: Payer: BC Managed Care – PPO

## 2023-07-01 NOTE — Progress Notes (Unsigned)
 HPI  Pt here for testosterone injection. 200mg  pt denies CP, SOB, headache, and mood changes.                             Assessment and Plan:  Pt given 200mg  of testosterone in RUOQ.

## 2023-07-02 DIAGNOSIS — Z419 Encounter for procedure for purposes other than remedying health state, unspecified: Secondary | ICD-10-CM | POA: Diagnosis not present

## 2023-07-06 NOTE — Therapy (Deleted)
 OUTPATIENT PHYSICAL THERAPY THORACOLUMBAR EVALUATION   Patient Name: Richard Beltran MRN: [id number] DOB:[birth year], 58 y.o., male Today's Date: 07/06/2023  END OF SESSION:   Past Medical History:  Diagnosis Date   Back pain    Hyperlipidemia    Hypertension    Hypertension    Joint pain    Lumbar degenerative disc disease 10/29/2013   Post L4-L5 fusion.    Male hypogonadism    Other fatigue    Shortness of breath on exertion    Past Surgical History:  Procedure Laterality Date   BACK SURGERY  2005   Patient Active Problem List   Diagnosis Date Noted   Mixed hyperlipidemia 07/21/2022   Primary osteoarthritis of left knee 10/14/2020   Plantar fasciitis, left 04/10/2018   Insomnia 07/29/2015   Obesity 11/18/2014   Obstructive sleep apnea 10/22/2014   Male hypogonadism 11/06/2013   Hypertension, essential, benign 10/29/2013   Lumbar degenerative disc disease 10/29/2013   Pulmonary nodule 10/29/2013   Annual physical exam 10/29/2013   Adhesive capsulitis of right shoulder 10/29/2013    PCP: Dr Rodney Langton  REFERRING PROVIDER: Dr Rodney Langton  REFERRING DIAG: lumbar intervertebral disc disease with discogenic pain and LE pain    Rationale for Evaluation and Treatment: Rehabilitation  THERAPY DIAG:  No diagnosis found.  ONSET DATE: ***  SUBJECTIVE:                                                                                                                                                                                           SUBJECTIVE STATEMENT: ***  PERTINENT HISTORY:  Patient has a 4 month history of LBP with radicular LE pain. He was treated with medication but has persistent pain and discomfort. History of obesity; chronic recurrent LBP  Patient has a history of L4-L5 fusion ~ 0 years ago. More recently he has increasing back pain, axial, right sided with radiation down the right leg to the great toe in L4 distribution   PAIN:  Are  you having pain? Yes: NPRS scale: *** Pain location: *** Pain description: *** Aggravating factors: *** Relieving factors: ***  PRECAUTIONS: {Therapy precautions:24002}  RED FLAGS: None   WEIGHT BEARING RESTRICTIONS: No  FALLS:  Has patient fallen in last 6 months? {fallsyesno:27318}  LIVING ENVIRONMENT: Lives with: lives with their spouse Lives in: House/apartment Stairs: {opstairs:27293} Has following equipment at home: {Assistive devices:23999}  OCCUPATION: ***  PLOF: Independent  PATIENT GOALS: ***  NEXT MD VISIT: 07/29/23  OBJECTIVE:  Note: Objective measures were completed at Evaluation unless otherwise noted.  DIAGNOSTIC FINDINGS:  MRI ordered lumbar spine   PATIENT SURVEYS:  Modified  Oswestry ***   COGNITION: Overall cognitive status: Within functional limits for tasks assessed     SENSATION: {sensation:27233}  MUSCLE LENGTH: Hamstrings: Right *** deg; Left *** deg Maisie Fus test: Right *** deg; Left *** deg  POSTURE: {posture:25561}  PALPATION: ***  LUMBAR ROM:   AROM eval  Flexion   Extension   Right lateral flexion   Left lateral flexion   Right rotation   Left rotation    (Blank rows = not tested)  LOWER EXTREMITY ROM:     Active Assistive Right eval Left eval  Hip flexion    Hip extension    Hip abduction    Hip adduction    Hip internal rotation    Hip external rotation    Knee flexion    Knee extension    Ankle dorsiflexion    Ankle plantarflexion    Ankle inversion    Ankle eversion     (Blank rows = not tested)  LOWER EXTREMITY MMT:    MMT Right eval Left eval  Hip flexion    Hip extension    Hip abduction    Hip adduction    Hip internal rotation    Hip external rotation    Knee flexion    Knee extension    Ankle dorsiflexion    Ankle plantarflexion    Ankle inversion    Ankle eversion     (Blank rows = not tested)  LUMBAR SPECIAL TESTS:  Straight leg raise test: {pos/neg:25243} and Slump test:  {pos/neg:25243}  FUNCTIONAL TESTS:  5 times sit to stand: ***  GAIT: Distance walked: 40 feet Assistive device utilized: {Assistive devices:23999} Level of assistance: {Levels of assistance:24026} Comments: ***  TREATMENT DATE: ***                                                                                                                                 PATIENT EDUCATION:  Education details: POC; HEP  Person educated: Patient Education method: Programmer, multimedia, Facilities manager, Actor cues, Verbal cues, and Handouts Education comprehension: verbalized understanding, returned demonstration, verbal cues required, tactile cues required, and needs further education  HOME EXERCISE PROGRAM: ***  ASSESSMENT:  CLINICAL IMPRESSION: Patient is a 58 y.o. male who was seen today for physical therapy evaluation and treatment for LBP with radicular LE pain. He has a history of chronic recurrent LBP   OBJECTIVE IMPAIRMENTS: {opptimpairments:25111}.   ACTIVITY LIMITATIONS: {activitylimitations:27494}  PARTICIPATION LIMITATIONS: {participationrestrictions:25113}  PERSONAL FACTORS: {Personal factors:25162} are also affecting patient's functional outcome.   REHAB POTENTIAL: Good  CLINICAL DECISION MAKING: Evolving/moderate complexity  EVALUATION COMPLEXITY: Moderate   GOALS: Goals reviewed with patient? Yes  SHORT TERM GOALS: Target date: ***  Independent in initial HEP Baseline: Goal status: INITIAL  2.  *** Baseline:  Goal status: INITIAL  3.  *** Baseline:  Goal status: INITIAL   LONG TERM GOALS: Target date: ***  *** Baseline:  Goal status: INITIAL  2.  ***  Baseline:  Goal status: INITIAL  3.  *** Baseline:  Goal status: INITIAL  4.  *** Baseline:  Goal status: INITIAL  5.  *** Baseline:  Goal status: INITIAL  6.  *** Baseline:  Goal status: INITIAL  PLAN:  PT FREQUENCY: 2x/week  PT DURATION: 8 weeks  PLANNED INTERVENTIONS:  97110-Therapeutic exercises, 97530- Therapeutic activity, 97112- Neuromuscular re-education, 97535- Self Care, 16109- Manual therapy, [id number]- Gait training, [phone]- Aquatic Therapy, [phone]- Electrical stimulation (unattended), [phone]- Ionotophoresis 4mg /ml Dexamethasone, Patient/Family education, Balance training, Stair training, Dry Needling, Joint mobilization, Spinal mobilization, Cryotherapy, and Moist heat.  PLAN FOR NEXT SESSION: review and progress exercises; continue with spine care education and body mechanics; manual work and modalities as indicated    [employer], PT 07/06/2023, 10:08 AM

## 2023-07-07 ENCOUNTER — Ambulatory Visit: Payer: BC Managed Care – PPO | Admitting: Rehabilitative and Restorative Service Providers"

## 2023-07-11 ENCOUNTER — Ambulatory Visit (INDEPENDENT_AMBULATORY_CARE_PROVIDER_SITE_OTHER): Admitting: Sports Medicine

## 2023-07-11 VITALS — BP 147/83 | HR 80

## 2023-07-11 DIAGNOSIS — M51369 Other intervertebral disc degeneration, lumbar region without mention of lumbar back pain or lower extremity pain: Secondary | ICD-10-CM

## 2023-07-11 DIAGNOSIS — E291 Testicular hypofunction: Secondary | ICD-10-CM

## 2023-07-11 MED ORDER — TESTOSTERONE CYPIONATE 200 MG/ML IM SOLN
200.0000 mg | Freq: Once | INTRAMUSCULAR | Status: AC
  Administered 2023-07-11: 200 mg via INTRAMUSCULAR

## 2023-07-11 MED ORDER — MELOXICAM 15 MG PO TABS: 15.0000 mg | ORAL_TABLET | Freq: Every day | ORAL | 0 refills | Status: DC

## 2023-07-11 NOTE — Progress Notes (Signed)
 Pt here for testosterone injection no SOB,CP or mood swings.   Injection given in RUOQ he tolerated well.  Pt will RTC in 2 weeks for next injection

## 2023-07-29 ENCOUNTER — Ambulatory Visit

## 2023-07-29 ENCOUNTER — Ambulatory Visit: Payer: BC Managed Care – PPO | Admitting: Sports Medicine

## 2023-08-08 ENCOUNTER — Other Ambulatory Visit: Payer: Self-pay | Admitting: Sports Medicine

## 2023-08-08 DIAGNOSIS — I10 Essential (primary) hypertension: Secondary | ICD-10-CM

## 2023-08-08 NOTE — Telephone Encounter (Signed)
 Copied from CRM [id number]. Topic: Clinical - Medication Refill >> Aug 08, 2023  3:08 PM Hector Shade B wrote: Most Recent Primary Care Visit:  Provider: Monica Becton  Department: [facility]  Visit Type: NURSE VISIT  Date: 07/11/2023  Medication:  amLODipine (NORVASC) 10 MG tablet carvedilol (COREG) 25 MG tablet lisinopril-hydrochlorothiazide (ZESTORETIC) 20-25 MG tablet  Has the patient contacted their pharmacy? No (Agent: If no, request that the patient contact the pharmacy for the refill. If patient does not wish to contact the pharmacy document the reason why and proceed with request.)Patient stated that he was requesting the refill while on the phone scheduling for his injection   Is this the correct pharmacy for this prescription? Yes If no, delete pharmacy and type the correct one.  This is the patient's preferred pharmacy:  [facility] [address] [place] - [address] [place] [address] Phone: [phone] Fax: [phone]  [facility] [phone] - [place], [place] - [address] [place] [address] Phone: [phone] Fax: [phone]   Has the prescription been filled recently? Yes  Is the patient out of the medication? Yes  Has the patient been seen for an appointment in the last year OR does the patient have an upcoming appointment? Yes  Can we respond through MyChart? Yes  Agent: Please be advised that Rx refills may take up to 3 business days. We ask that you follow-up with your pharmacy.

## 2023-08-09 MED ORDER — AMLODIPINE BESYLATE 10 MG PO TABS: 10.0000 mg | ORAL_TABLET | Freq: Every day | ORAL | 1 refills | Status: DC

## 2023-08-09 MED ORDER — CARVEDILOL 25 MG PO TABS: 25.0000 mg | ORAL_TABLET | Freq: Two times a day (BID) | ORAL | 3 refills | Status: DC

## 2023-08-09 MED ORDER — LISINOPRIL-HYDROCHLOROTHIAZIDE 20-25 MG PO TABS: 1.0000 | ORAL_TABLET | Freq: Every day | ORAL | 1 refills | Status: DC

## 2023-08-09 NOTE — Telephone Encounter (Signed)
 Requesting rx rf of  Amlodipine 10mg  -last written 01/11/2023 Carvedilol 25mg  -last written 08/10/2022 Lisinopril/hydrochlorothiazide 20-25 Last written 01/11/2023 Last OV 06/14/2023 Upcoming appt with provider =none

## 2023-08-12 ENCOUNTER — Ambulatory Visit (INDEPENDENT_AMBULATORY_CARE_PROVIDER_SITE_OTHER): Admitting: Sports Medicine

## 2023-08-12 DIAGNOSIS — E291 Testicular hypofunction: Secondary | ICD-10-CM | POA: Diagnosis not present

## 2023-08-12 DIAGNOSIS — M51369 Other intervertebral disc degeneration, lumbar region without mention of lumbar back pain or lower extremity pain: Secondary | ICD-10-CM

## 2023-08-12 NOTE — Progress Notes (Addendum)
 Pt here today to get testosterone injection. Denies CP, SOB, mood changes. Pt is also asking for refill on his tramadol. Upon looking in pt chart it looks like it was sent in, in feb. If he has trouble getting it filled he is to reach back out to [place].                          Pt given 200 mg of testosterone in LUOQ. Tolerated well no redness or swelling noted at the site. Pt advised to RTC in 14 days around 08/26/23 Roselyn Reef, CMA

## 2023-08-13 DIAGNOSIS — Z419 Encounter for procedure for purposes other than remedying health state, unspecified: Secondary | ICD-10-CM | POA: Diagnosis not present

## 2023-08-26 ENCOUNTER — Ambulatory Visit

## 2023-08-26 VITALS — BP 169/80 | HR 83 | Ht 73.0 in | Wt 298.0 lb

## 2023-08-26 DIAGNOSIS — E291 Testicular hypofunction: Secondary | ICD-10-CM

## 2023-08-26 NOTE — Patient Instructions (Signed)
 Walk 20 minutes per day. Hydrate. Monitor diet.

## 2023-08-26 NOTE — Progress Notes (Signed)
 Patient is here for a testosterone  injection of 200mg /ml.  Given in RUOQ  Denies chest pain, shortness of breath, headaches and problems with medication or mood changes.  Tolerated injection well without complications.   Patient advised to schedule next injection in 14 days.  Pt expressed concern about weight. Advised pt concerning walking and increasing metabolism. Also advised pt to schedule appt with PCP.

## 2023-09-05 ENCOUNTER — Ambulatory Visit (INDEPENDENT_AMBULATORY_CARE_PROVIDER_SITE_OTHER): Admitting: Sports Medicine

## 2023-09-05 VITALS — BP 148/74 | HR 66 | Wt 296.0 lb

## 2023-09-05 DIAGNOSIS — E6609 Other obesity due to excess calories: Secondary | ICD-10-CM

## 2023-09-05 MED ORDER — ZEPBOUND 2.5 MG/0.5ML ~~LOC~~ SOAJ: 2.5000 mg | SUBCUTANEOUS | 0 refills | Status: DC

## 2023-09-05 MED ORDER — ONDANSETRON 8 MG PO TBDP: 8.0000 mg | ORAL_TABLET | Freq: Three times a day (TID) | ORAL | 3 refills | Status: AC | PRN

## 2023-09-05 NOTE — Assessment & Plan Note (Signed)
 Pleasant 58 year old male, he has struggled with his weight for some time now, he has tried diet and exercise without success, he is a good candidate for GLP-1 treatment, he will be enrolled in a multidisciplinary weight loss program with calorie counting, and exercise prescription, he has no contraindications to GLP-1's, he will not be on any other weight loss medications. Starting Zepbound. Not a candidate for Wegovy , did not respond well to semaglutide  in the past.

## 2023-09-05 NOTE — Progress Notes (Signed)
    Procedures performed today:    None.  Independent interpretation of notes and tests performed by another provider:   None.  Brief History, Exam, Impression, and Recommendations:    Obesity Pleasant 58 year old male, he has struggled with his weight for some time now, he has tried diet and exercise without success, he is a good candidate for GLP-1 treatment, he will be enrolled in a multidisciplinary weight loss program with calorie counting, and exercise prescription, he has no contraindications to GLP-1's, he will not be on any other weight loss medications. Starting Zepbound. Not a candidate for Wegovy , did not respond well to semaglutide  in the past.    ____________________________________________ Joselyn Nicely. Sandy Crumb, M.D., ABFM., CAQSM., AME. Primary Care and Sports Medicine [facility]  Adjunct Professor of [facility] Medicine  [employer]  Restaurant manager, fast food

## 2023-09-09 ENCOUNTER — Other Ambulatory Visit (HOSPITAL_COMMUNITY): Payer: Self-pay

## 2023-09-09 ENCOUNTER — Telehealth: Payer: Self-pay

## 2023-09-09 ENCOUNTER — Ambulatory Visit (INDEPENDENT_AMBULATORY_CARE_PROVIDER_SITE_OTHER)

## 2023-09-09 ENCOUNTER — Telehealth: Payer: Self-pay | Admitting: Sports Medicine

## 2023-09-09 VITALS — BP 136/72 | HR 79 | Resp 20 | Ht 73.0 in | Wt 296.0 lb

## 2023-09-09 DIAGNOSIS — E291 Testicular hypofunction: Secondary | ICD-10-CM

## 2023-09-09 DIAGNOSIS — E6609 Other obesity due to excess calories: Secondary | ICD-10-CM

## 2023-09-09 NOTE — Progress Notes (Signed)
   Subjective:    Patient ID: Richard Beltran, male    DOB: Feb 02, 1966, 59 y.o.   MRN: [id number]  HPI Patient is here for a testosterone  injection. Denies chest pain, shortness of breath, headaches and problems with medication or mood changes.   Review of Systems     Objective:   Physical Exam        Assessment & Plan:   Patient tolerated injection on LUOQ well without complications. Patient advised to schedule next injection in 14 days.

## 2023-09-09 NOTE — Telephone Encounter (Signed)
 Okay please let him know weight loss drugs are a plan exclusion for his insurance plan, and that he will probably just have to use the generic compounded formulation, if okay with him I will [facility] in [place].

## 2023-09-09 NOTE — Telephone Encounter (Signed)
 Pharmacy Patient Advocate Encounter   Received notification from Pt Calls Messages that prior authorization for Zepbound  2.5mg /0.27ml is required/requested.   Insurance verification completed.   The patient is insured through [facility] .   Per test claim: Weight loss drugs not covered.

## 2023-09-09 NOTE — Telephone Encounter (Signed)
Patient needs PA for Zepbound.

## 2023-09-12 MED ORDER — TIRZEPATIDE 10 MG/0.5ML ~~LOC~~ SOAJ: SUBCUTANEOUS | 11 refills | Status: DC

## 2023-09-12 NOTE — Telephone Encounter (Signed)
 Per patient, he would like to try the compounded medication. [facility] to [facility]. Patient aware that the pharmacy will contact him regarding payment and shipping/pickup.

## 2023-09-12 NOTE — Addendum Note (Signed)
 Addended by: Gean Keels on: 09/12/2023 12:14 PM   Modules accepted: Orders

## 2023-09-12 NOTE — Telephone Encounter (Signed)
 sent

## 2023-09-20 ENCOUNTER — Other Ambulatory Visit: Payer: Self-pay | Admitting: Sports Medicine

## 2023-09-20 DIAGNOSIS — M51369 Other intervertebral disc degeneration, lumbar region without mention of lumbar back pain or lower extremity pain: Secondary | ICD-10-CM

## 2023-09-20 NOTE — Telephone Encounter (Unsigned)
 Copied from CRM [id number]. Topic: Clinical - Medication Refill >> Sep 20, 2023  3:07 PM Kevelyn M wrote: Medication: traMADol  (ULTRAM ) 50 MG tablet  Has the patient contacted their pharmacy? No (Agent: If no, request that the patient contact the pharmacy for the refill. If patient does not wish to contact the pharmacy document the reason why and proceed with request.) (Agent: If yes, when and what did the pharmacy advise?)  This is the patient's preferred pharmacy:  [facility] [address], [place] - [address] [place] [address] Phone: [phone] Fax: [phone]   Is this the correct pharmacy for this prescription? Yes If no, delete pharmacy and type the correct one.   Has the prescription been filled recently? Yes  Is the patient out of the medication? No, 4 pills left  Has the patient been seen for an appointment in the last year OR does the patient have an upcoming appointment? Yes  Can we respond through MyChart? Yes  Agent: Please be advised that Rx refills may take up to 3 business days. We ask that you follow-up with your pharmacy.

## 2023-09-23 ENCOUNTER — Other Ambulatory Visit: Payer: Self-pay | Admitting: Sports Medicine

## 2023-09-23 DIAGNOSIS — M51369 Other intervertebral disc degeneration, lumbar region without mention of lumbar back pain or lower extremity pain: Secondary | ICD-10-CM

## 2023-09-27 ENCOUNTER — Ambulatory Visit (INDEPENDENT_AMBULATORY_CARE_PROVIDER_SITE_OTHER)

## 2023-09-27 VITALS — BP 172/90 | HR 78

## 2023-09-27 DIAGNOSIS — E291 Testicular hypofunction: Secondary | ICD-10-CM

## 2023-09-27 NOTE — Progress Notes (Signed)
 Pt is here for Testerone injection, denies chest pain shortness of breath, headaches and problems with medication or mood changes ,Injection was tolerated well by the patient. (See MAR for injection details)

## 2023-10-03 ENCOUNTER — Encounter: Payer: Self-pay | Admitting: Sports Medicine

## 2023-10-03 ENCOUNTER — Ambulatory Visit (INDEPENDENT_AMBULATORY_CARE_PROVIDER_SITE_OTHER): Admitting: Sports Medicine

## 2023-10-03 VITALS — BP 134/68 | HR 72 | Resp 20 | Ht 73.0 in | Wt 292.0 lb

## 2023-10-03 DIAGNOSIS — M51369 Other intervertebral disc degeneration, lumbar region without mention of lumbar back pain or lower extremity pain: Secondary | ICD-10-CM | POA: Diagnosis not present

## 2023-10-03 DIAGNOSIS — E6609 Other obesity due to excess calories: Secondary | ICD-10-CM

## 2023-10-03 MED ORDER — TRAMADOL HCL 50 MG PO TABS: 100.0000 mg | ORAL_TABLET | Freq: Two times a day (BID) | ORAL | 3 refills | Status: DC

## 2023-10-03 NOTE — Assessment & Plan Note (Signed)
 Richard Beltran returns, he has been on compounded tirzepatide  for about 3 weeks now, he is 6 pounds down, he is tolerating the shots in the technique, return in 3 months. Of note branded weight loss medications are a plan exclusion for him.

## 2023-10-03 NOTE — Progress Notes (Signed)
    Procedures performed today:    None.  Independent interpretation of notes and tests performed by another provider:   None.  Brief History, Exam, Impression, and Recommendations:    Obesity Richard Beltran returns, he has been on compounded tirzepatide  for about 3 weeks now, he is 6 pounds down, he is tolerating the shots in the technique, return in 3 months. Of note branded weight loss medications are a plan exclusion for him.    ____________________________________________ Joselyn Nicely. Sandy Crumb, M.D., ABFM., CAQSM., AME. Primary Care and Sports Medicine [facility]  Adjunct Professor of [facility] Medicine  [employer]  Restaurant manager, fast food

## 2023-10-08 ENCOUNTER — Other Ambulatory Visit: Payer: Self-pay | Admitting: Sports Medicine

## 2023-10-08 DIAGNOSIS — M51369 Other intervertebral disc degeneration, lumbar region without mention of lumbar back pain or lower extremity pain: Secondary | ICD-10-CM

## 2023-10-14 ENCOUNTER — Ambulatory Visit (INDEPENDENT_AMBULATORY_CARE_PROVIDER_SITE_OTHER): Admitting: Sports Medicine

## 2023-10-14 VITALS — BP 152/71 | HR 72 | Ht 73.0 in | Wt 290.0 lb

## 2023-10-14 DIAGNOSIS — E291 Testicular hypofunction: Secondary | ICD-10-CM

## 2023-10-14 NOTE — Progress Notes (Signed)
Patient is here for a testosterone injection of 200mg/ml.  Given in LUOQ.  Denies chest pain, shortness of breath, headaches and problems with medication or mood changes.  Tolerated injection well without complications.   Patient advised to schedule next injection in 14 days.  

## 2023-10-28 ENCOUNTER — Ambulatory Visit

## 2023-10-28 VITALS — BP 162/86 | HR 80 | Ht 72.0 in | Wt 290.0 lb

## 2023-10-28 DIAGNOSIS — E291 Testicular hypofunction: Secondary | ICD-10-CM

## 2023-10-28 NOTE — Progress Notes (Addendum)
 Pt is here for Testerone injection, denies chest pain shortness of breath, headaches and problems with medication or mood changes ,Injection was tolerated well by the patient. (See MAR for injection details)

## 2023-11-11 ENCOUNTER — Encounter: Payer: Self-pay | Admitting: Sports Medicine

## 2023-11-11 ENCOUNTER — Ambulatory Visit (INDEPENDENT_AMBULATORY_CARE_PROVIDER_SITE_OTHER): Admitting: Sports Medicine

## 2023-11-11 VITALS — BP 148/77 | HR 77 | Ht 73.0 in | Wt 285.0 lb

## 2023-11-11 DIAGNOSIS — E291 Testicular hypofunction: Secondary | ICD-10-CM

## 2023-11-11 MED ORDER — TESTOSTERONE CYPIONATE 200 MG/ML IM SOLN
200.0000 mg | Freq: Once | INTRAMUSCULAR | Status: AC
  Administered 2023-11-11: 200 mg via INTRAMUSCULAR

## 2023-11-11 NOTE — Progress Notes (Signed)
 Pt here for testosterone  injection.  no SOB,CP or mood swings.   Injection tolerated well given in LUOQ

## 2023-11-28 ENCOUNTER — Ambulatory Visit (INDEPENDENT_AMBULATORY_CARE_PROVIDER_SITE_OTHER)

## 2023-11-28 VITALS — BP 161/70 | HR 84

## 2023-11-28 DIAGNOSIS — E291 Testicular hypofunction: Secondary | ICD-10-CM | POA: Diagnosis not present

## 2023-11-28 NOTE — Progress Notes (Signed)
   Established Patient Office Visit  Subjective   Patient ID: Richard Beltran, male    DOB: 1966-01-09  Age: 58 y.o. MRN: [id number]  No chief complaint on file.   HPI  [name] is here for a testosterone  injection. Denies chest pain, shortness of breath, headaches or mood changes.   ROS    Objective:     BP (!) 161/70   Pulse 84   SpO2 99%    Physical Exam   No results found for any visits on 11/28/23.    The 10-year ASCVD risk score (Arnett DK, et al., 2019) is: 20.1%    Assessment & Plan:  Testosterone  injection - Patient tolerated injection well without complications. Patient advised to schedule next injection 14 days from today.    He may need to follow up with Dr Curtis for blood pressure check.   Problem List Items Addressed This Visit       Unprioritized   Male hypogonadism - Primary    Return in about 2 weeks (around 12/12/2023) for testosterone  injection. SABRA Pear, Jon Mayor, CMA

## 2023-12-09 ENCOUNTER — Ambulatory Visit: Admitting: Sports Medicine

## 2023-12-09 VITALS — BP 132/82 | HR 80 | Ht 73.0 in | Wt 285.0 lb

## 2023-12-09 DIAGNOSIS — E291 Testicular hypofunction: Secondary | ICD-10-CM | POA: Diagnosis not present

## 2023-12-09 NOTE — Progress Notes (Signed)
Patient is here for a testosterone injection of 200mg/ml.  Given in LUOQ.  Denies chest pain, shortness of breath, headaches and problems with medication or mood changes.  Tolerated injection well without complications.   Patient advised to schedule next injection in 14 days.  

## 2023-12-16 ENCOUNTER — Ambulatory Visit

## 2023-12-23 ENCOUNTER — Ambulatory Visit: Admitting: Sports Medicine

## 2023-12-23 VITALS — BP 164/91 | HR 80 | Ht 73.0 in | Wt 285.0 lb

## 2023-12-23 DIAGNOSIS — M51369 Other intervertebral disc degeneration, lumbar region without mention of lumbar back pain or lower extremity pain: Secondary | ICD-10-CM

## 2023-12-23 DIAGNOSIS — E291 Testicular hypofunction: Secondary | ICD-10-CM | POA: Diagnosis not present

## 2023-12-23 DIAGNOSIS — M5136 Other intervertebral disc degeneration, lumbar region with discogenic back pain only: Secondary | ICD-10-CM

## 2023-12-23 MED ORDER — MELOXICAM 15 MG PO TABS: 15.0000 mg | ORAL_TABLET | Freq: Every day | ORAL | 0 refills | Status: DC

## 2023-12-23 NOTE — Progress Notes (Signed)
 Patient is here for a testosterone  injection of 200mg /ml.  Given in RUOQ.  Denies chest pain, shortness of breath, headaches and problems with medication or mood changes. Tolerated injection well without complications.   Patient advised to schedule next injection in 14 days.  Pt requested refill of Meloxicam .

## 2023-12-28 DIAGNOSIS — R059 Cough, unspecified: Secondary | ICD-10-CM | POA: Diagnosis not present

## 2023-12-28 DIAGNOSIS — Z6837 Body mass index (BMI) 37.0-37.9, adult: Secondary | ICD-10-CM | POA: Diagnosis not present

## 2023-12-28 DIAGNOSIS — I10 Essential (primary) hypertension: Secondary | ICD-10-CM | POA: Diagnosis not present

## 2023-12-28 DIAGNOSIS — R6883 Chills (without fever): Secondary | ICD-10-CM | POA: Diagnosis not present

## 2023-12-28 DIAGNOSIS — J209 Acute bronchitis, unspecified: Secondary | ICD-10-CM | POA: Diagnosis not present

## 2024-01-03 ENCOUNTER — Encounter

## 2024-01-03 ENCOUNTER — Ambulatory Visit: Admitting: Sports Medicine

## 2024-01-03 ENCOUNTER — Encounter: Payer: Self-pay | Admitting: Sports Medicine

## 2024-01-04 NOTE — Progress Notes (Signed)
 Erroneous encounter

## 2024-01-06 ENCOUNTER — Ambulatory Visit (INDEPENDENT_AMBULATORY_CARE_PROVIDER_SITE_OTHER): Admitting: Urgent Care

## 2024-01-06 ENCOUNTER — Ambulatory Visit

## 2024-01-06 VITALS — BP 135/72 | HR 84 | Ht 73.0 in | Wt 284.0 lb

## 2024-01-06 DIAGNOSIS — E291 Testicular hypofunction: Secondary | ICD-10-CM | POA: Diagnosis not present

## 2024-01-06 DIAGNOSIS — I10 Essential (primary) hypertension: Secondary | ICD-10-CM | POA: Diagnosis not present

## 2024-01-06 DIAGNOSIS — E782 Mixed hyperlipidemia: Secondary | ICD-10-CM

## 2024-01-06 DIAGNOSIS — E6609 Other obesity due to excess calories: Secondary | ICD-10-CM

## 2024-01-06 MED ORDER — ZEPBOUND 10 MG/0.5ML ~~LOC~~ SOAJ: 10.0000 mg | SUBCUTANEOUS | 3 refills | Status: DC

## 2024-01-06 NOTE — Progress Notes (Signed)
 Established Patient Office Visit  Subjective:  Patient ID: Richard Beltran, male    DOB: 19-Jan-1966  Age: 58 y.o. MRN: [id number]  Chief Complaint  Patient presents with   Weight Check    Mounjaro     HPI  Discussed the use of AI scribe software for clinical note transcription with the patient, who gave verbal consent to proceed.  History of Present Illness   Richard Beltran is a 58 year old male who presents for follow-up regarding weight loss management with Liposlim.  He has been using Liposlim for weight loss, starting at a dose of 2.5 mg three months ago and titrating up to 10 mg. He has lost approximately 12 pounds since beginning the medication, with an initial weight of 296 pounds in May and a current weight of 284 pounds. He is currently out of Liposlim and is due for a refill, having been paying $320 for the medication.  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 He manages to incorporate some movement by walking during breaks at work. His dietary habits include drinking one Pepsi per week and consuming a zero-calorie, zero-sugar sparkling water from [employer].  He is not diabetic, with a last A1c of 5.5 a year and a half ago, and all A1c levels over the past ten years have been normal.  He is on testosterone  therapy, receiving injections every other Friday. His last testosterone  evaluation was in March 2024, and he is due for routine labs including a lipid panel, A1c, and PSA.      Patient Active Problem List   Diagnosis Date Noted   Mixed hyperlipidemia 07/21/2022   Primary osteoarthritis of left knee 10/14/2020   Plantar fasciitis, left 04/10/2018   Insomnia 07/29/2015   Obesity 11/18/2014   Obstructive sleep apnea 10/22/2014   Male hypogonadism 11/06/2013   Hypertension, essential, benign 10/29/2013   Lumbar degenerative disc disease 10/29/2013    Pulmonary nodule 10/29/2013   Annual physical exam 10/29/2013   Adhesive capsulitis of right shoulder 10/29/2013   Past Medical History:  Diagnosis Date   Back pain    Hyperlipidemia    Hypertension    Hypertension    Joint pain    Lumbar degenerative disc disease 10/29/2013   Post L4-L5 fusion.    Male hypogonadism    Other fatigue    Shortness of breath on exertion    Past Surgical History:  Procedure Laterality Date   BACK SURGERY  2005   Social History   Tobacco Use   Smoking status: Never   Smokeless tobacco: Never  Vaping Use   Vaping status: Never Used  Substance Use Topics   Alcohol use: No   Drug use: No      ROS: as noted in HPI  Objective:     BP 135/72   Pulse 84   Ht 6' 1 (1.854 m)   Wt 284 lb (128.8 kg)   SpO2 97%   BMI 37.47 kg/m  BP Readings from Last 3 Encounters:  01/06/24 135/72  12/23/23 (!) 164/91  12/09/23 132/82   Wt Readings from Last 3 Encounters:  01/06/24 284 lb (128.8 kg)  01/03/24 285 lb (129.3 kg)  12/23/23 285 lb (129.3 kg)      Physical Exam Vitals and nursing note reviewed.  Constitutional:      General: He is not in acute distress.    Appearance:  Normal appearance. He is not ill-appearing, toxic-appearing or diaphoretic.  HENT:     Head: Normocephalic and atraumatic.     Right Ear: External ear normal.     Left Ear: External ear normal.     Nose: Nose normal.  Eyes:     General: No scleral icterus.    Pupils: Pupils are equal, round, and reactive to light.  Cardiovascular:     Rate and Rhythm: Normal rate.  Pulmonary:     Effort: Pulmonary effort is normal. No respiratory distress.  Skin:    General: Skin is warm and dry.     Findings: No erythema or rash.  Neurological:     General: No focal deficit present.     Mental Status: He is alert and oriented to person, place, and time.      No results found for any visits on 01/06/24.  Last CBC Lab Results  Component Value Date   WBC 7.1 07/20/2022    HGB 16.2 07/20/2022   HCT 50.6 (H) 07/20/2022   MCV 89.7 07/20/2022   MCH 28.7 07/20/2022   RDW 12.7 07/20/2022   PLT 227 07/20/2022   Last metabolic panel Lab Results  Component Value Date   GLUCOSE 98 07/20/2022   NA 141 07/20/2022   K 4.0 07/20/2022   CL 104 07/20/2022   CO2 28 07/20/2022   BUN 12 07/20/2022   CREATININE 1.13 07/20/2022   EGFR 67 10/02/2020   CALCIUM  9.4 07/20/2022   PROT 7.1 07/20/2022   ALBUMIN 4.2 10/02/2020   LABGLOB 3.1 10/02/2020   AGRATIO 1.4 10/02/2020   BILITOT 0.6 07/20/2022   ALKPHOS 60 10/02/2020   AST 17 07/20/2022   ALT 23 07/20/2022   Last lipids Lab Results  Component Value Date   CHOL 207 (H) 07/20/2022   HDL 39 (L) 07/20/2022   LDLCALC 142 (H) 07/20/2022   TRIG 134 07/20/2022   CHOLHDL 5.3 (H) 07/20/2022   Last hemoglobin A1c Lab Results  Component Value Date   HGBA1C 5.5 07/20/2022   Last thyroid  functions Lab Results  Component Value Date   TSH 1.45 07/20/2022   T3TOTAL 160 10/02/2020   T4TOTAL 7.2 10/02/2020   Last vitamin D Lab Results  Component Value Date   VD25OH 14.5 (L) 10/02/2020   Last vitamin B12 and Folate Lab Results  Component Value Date   VITAMINB12 322 10/02/2020   FOLATE 6.2 10/02/2020      The 10-year ASCVD risk score (Arnett DK, et al., 2019) is: 14.8%  Assessment & Plan:  Obesity due to excess calories with serious comorbidity, unspecified class -     Zepbound ; Inject 10 mg into the skin once a week.  Dispense: 2 mL; Refill: 3 -     Hemoglobin A1c  Male hypogonadism [E29.1] -     CBC with Differential/Platelet -     Lipid panel -     Comprehensive metabolic panel with GFR -     PSA -     Testosterone ,Free and Total  Mixed hyperlipidemia -     Lipid panel  Hypertension, essential, benign -     CBC with Differential/Platelet -     TSH -     Comprehensive metabolic panel with GFR  Assessment and Plan    Obesity due to excess calories Weight loss of 12 lbs over three months  with Liposlim. Current dose 10 mg, plan to increase to 12.5 mg. Discussed potential switch to Zepbound  for increased efficacy. Emphasized diet and physical  activity importance. - Call in Zepbound  10 mg to [facility] pharmacy. - Continue Liposlim until Zepbound  is available. - Advise on dietary modifications, including calorie counting and carb consciousness. - Encourage physical activity, suggesting creative methods to increase heart rate.  Testosterone  therapy monitoring Annual monitoring required. Last testosterone  evaluation in March 2024. Next injection due September 19th. Labs needed before injection for accurate levels. - Schedule nurse visit for labs at 8 AM on September 19th. - Draw testosterone  level, lipid panel, A1c, and PSA on September 19th. - Administer testosterone  injection after lab draw on September 19th.   Mixed hyperlipidemia Pt due for recheck, fasting labs  HTN Well controlled on current medications, continue        Return in about 4 weeks (around 02/03/2024).   Benton LITTIE Gave, PA

## 2024-01-06 NOTE — Patient Instructions (Signed)
 If covered, stop liposlim and start Zepbound . We will keep you at the 10mg  weekly dose.  Please return for FASTING LABS on 01/20/24 and get these drawn BEFORE you T injection.  Please schedule a follow up in office with me in one month to discuss labs and review weight loss from zepbound .

## 2024-01-07 ENCOUNTER — Encounter: Payer: Self-pay | Admitting: Urgent Care

## 2024-01-20 ENCOUNTER — Ambulatory Visit (INDEPENDENT_AMBULATORY_CARE_PROVIDER_SITE_OTHER)

## 2024-01-20 VITALS — BP 149/81 | HR 73 | Ht 73.0 in

## 2024-01-20 DIAGNOSIS — E291 Testicular hypofunction: Secondary | ICD-10-CM | POA: Diagnosis not present

## 2024-01-20 DIAGNOSIS — E782 Mixed hyperlipidemia: Secondary | ICD-10-CM | POA: Diagnosis not present

## 2024-01-20 DIAGNOSIS — I10 Essential (primary) hypertension: Secondary | ICD-10-CM | POA: Diagnosis not present

## 2024-01-20 NOTE — Progress Notes (Signed)
   Established Patient Office Visit  Subjective   Patient ID: Richard Beltran, male    DOB: Mar 11, 1966  Age: 58 y.o. MRN: [id number]  Chief Complaint  Patient presents with   male hypogonadism    Testosterone  injection nurse visit    HPI  Male Hypogonadism- Testosterone  injection nurse visit. Patient denies chest pain, shortness of breath, palpitations, headaches, dizziness, mood changes or medication problems.   ROS    Objective:     BP (!) 149/81   Pulse 73   Ht 6' 1 (1.854 m)   SpO2 96%   BMI 37.47 kg/m    Physical Exam   No results found for any visits on 01/20/24.    The 10-year ASCVD risk score (Arnett DK, et al., 2019) is: 17.6%    Assessment & Plan:  Testosterone  injection - admin 200mg  IM RUOQ. Patient tolerated injection well without complications. Patient will return in 14 days for next testosterone  injection as nurse visit.  Problem List Items Addressed This Visit   None   No follow-ups on file.    Suzen SHAUNNA Plenty, LPN

## 2024-01-24 ENCOUNTER — Ambulatory Visit: Payer: Self-pay | Admitting: Family Medicine

## 2024-01-24 DIAGNOSIS — E782 Mixed hyperlipidemia: Secondary | ICD-10-CM

## 2024-01-24 NOTE — Progress Notes (Signed)
 Hi Richard Beltran,\ Your blood count overall looks good.  Total cholesterol and LDL are elevated and your good cholesterol is low.  Just encourage you to continue working on more Mediterranean type diet with exercise for 30 minutes 5 days a week to reduce your cholesterol numbers.  In fact your current 10-year risk for cardiovascular disease is around 18%.  Any risk of 10% or higher it is strongly recommended to start a statin.  Statin will help lower your cholesterol numbers and in addition help reduce your risk for heart attack and stroke.  See that you do have Crestor  on your list.  Are you currently taking that medication or have you run out?  If yes, then I will be happy to refill it for you.  If you have been taking it regularly then we need to look at your dose and adjust that upward.  Kidney function is elevated normally around 1.1-1.2.  It looks like at this time it was 1.4 so would like to keep a close eye on that and plan to recheck again in about 6 weeks.  Liver function looks great.  Your A1c is in the normal range.  No sign of diabetes.  Thyroid  looks great.  Prostate test is normal.  Testosterone  levels look much better.  The 10-year ASCVD risk score (Arnett DK, et al., 2019) is: 18.2%   Values used to calculate the score:     Age: 58 years     Clincally relevant sex: Male     Is Non-Hispanic African American: Yes     Diabetic: No     Tobacco smoker: No     Systolic Blood Pressure: 149 mmHg     Is BP treated: Yes     HDL Cholesterol: 36 mg/dL     Total Cholesterol: 218 mg/dL

## 2024-01-25 LAB — COMPREHENSIVE METABOLIC PANEL WITH GFR
ALT: 24 IU/L (ref 0–44)
AST: 19 IU/L (ref 0–40)
Albumin: 4.3 g/dL (ref 3.8–4.9)
Alkaline Phosphatase: 81 IU/L (ref 47–123)
BUN/Creatinine Ratio: 10 (ref 9–20)
BUN: 14 mg/dL (ref 6–24)
Bilirubin Total: 0.5 mg/dL (ref 0.0–1.2)
CO2: 23 mmol/L (ref 20–29)
Calcium: 9.7 mg/dL (ref 8.7–10.2)
Chloride: 103 mmol/L (ref 96–106)
Creatinine, Ser: 1.4 mg/dL — ABNORMAL HIGH (ref 0.76–1.27)
Globulin, Total: 2.6 g/dL (ref 1.5–4.5)
Glucose: 104 mg/dL — ABNORMAL HIGH (ref 70–99)
Potassium: 4.3 mmol/L (ref 3.5–5.2)
Sodium: 143 mmol/L (ref 134–144)
Total Protein: 6.9 g/dL (ref 6.0–8.5)
eGFR: 58 mL/min/1.73 — ABNORMAL LOW (ref >59)

## 2024-01-25 LAB — LIPID PANEL
Chol/HDL Ratio: 6.1 ratio — ABNORMAL HIGH (ref 0.0–5.0)
Cholesterol, Total: 218 mg/dL — ABNORMAL HIGH (ref 100–199)
HDL: 36 mg/dL — ABNORMAL LOW (ref >39)
LDL Chol Calc (NIH): 162 mg/dL — ABNORMAL HIGH (ref 0–99)
Triglycerides: 109 mg/dL (ref 0–149)
VLDL Cholesterol Cal: 20 mg/dL (ref 5–40)

## 2024-01-25 LAB — CBC WITH DIFFERENTIAL/PLATELET
Basophils Absolute: 0 x10E3/uL (ref 0.0–0.2)
Basos: 1 %
EOS (ABSOLUTE): 0.2 x10E3/uL (ref 0.0–0.4)
Eos: 4 %
Hematocrit: 49.7 % (ref 37.5–51.0)
Hemoglobin: 15.4 g/dL (ref 13.0–17.7)
Immature Grans (Abs): 0 x10E3/uL (ref 0.0–0.1)
Immature Granulocytes: 0 %
Lymphocytes Absolute: 2 x10E3/uL (ref 0.7–3.1)
Lymphs: 32 %
MCH: 28.4 pg (ref 26.6–33.0)
MCHC: 31 g/dL — ABNORMAL LOW (ref 31.5–35.7)
MCV: 92 fL (ref 79–97)
Monocytes Absolute: 0.4 x10E3/uL (ref 0.1–0.9)
Monocytes: 7 %
Neutrophils Absolute: 3.4 x10E3/uL (ref 1.4–7.0)
Neutrophils: 56 %
Platelets: 232 x10E3/uL (ref 150–450)
RBC: 5.43 x10E6/uL (ref 4.14–5.80)
RDW: 13.6 % (ref 11.6–15.4)
WBC: 6.1 x10E3/uL (ref 3.4–10.8)

## 2024-01-25 LAB — TSH: TSH: 1.9 u[IU]/mL (ref 0.450–4.500)

## 2024-01-25 LAB — HEMOGLOBIN A1C
Est. average glucose Bld gHb Est-mCnc: 100 mg/dL
Hgb A1c MFr Bld: 5.1 % (ref 4.8–5.6)

## 2024-01-25 LAB — TESTOSTERONE,FREE AND TOTAL
Testosterone, Free: 8.8 pg/mL (ref 7.2–24.0)
Testosterone: 561 ng/dL (ref 264–916)

## 2024-01-25 LAB — PSA: Prostate Specific Ag, Serum: 1 ng/mL (ref 0.0–4.0)

## 2024-01-27 ENCOUNTER — Other Ambulatory Visit: Payer: Self-pay

## 2024-01-27 DIAGNOSIS — E782 Mixed hyperlipidemia: Secondary | ICD-10-CM

## 2024-01-27 MED ORDER — ROSUVASTATIN CALCIUM 10 MG PO TABS: 10.0000 mg | ORAL_TABLET | Freq: Every day | ORAL | 3 refills | Status: DC

## 2024-02-03 ENCOUNTER — Ambulatory Visit: Admitting: Urgent Care

## 2024-02-03 ENCOUNTER — Ambulatory Visit

## 2024-02-03 VITALS — BP 120/66 | HR 68 | Ht 73.0 in | Wt 278.0 lb

## 2024-02-03 DIAGNOSIS — Z6836 Body mass index (BMI) 36.0-36.9, adult: Secondary | ICD-10-CM | POA: Diagnosis not present

## 2024-02-03 DIAGNOSIS — E782 Mixed hyperlipidemia: Secondary | ICD-10-CM

## 2024-02-03 DIAGNOSIS — M51369 Other intervertebral disc degeneration, lumbar region without mention of lumbar back pain or lower extremity pain: Secondary | ICD-10-CM

## 2024-02-03 DIAGNOSIS — I10 Essential (primary) hypertension: Secondary | ICD-10-CM | POA: Diagnosis not present

## 2024-02-03 DIAGNOSIS — E291 Testicular hypofunction: Secondary | ICD-10-CM | POA: Diagnosis not present

## 2024-02-03 MED ORDER — CARVEDILOL 25 MG PO TABS: 25.0000 mg | ORAL_TABLET | Freq: Two times a day (BID) | ORAL | 3 refills | Status: AC

## 2024-02-03 MED ORDER — TRAMADOL HCL 50 MG PO TABS: 100.0000 mg | ORAL_TABLET | Freq: Two times a day (BID) | ORAL | 3 refills | Status: DC

## 2024-02-03 MED ORDER — AMLODIPINE BESYLATE 10 MG PO TABS: 10.0000 mg | ORAL_TABLET | Freq: Every day | ORAL | 1 refills | Status: AC

## 2024-02-03 MED ORDER — LISINOPRIL-HYDROCHLOROTHIAZIDE 20-25 MG PO TABS: 1.0000 | ORAL_TABLET | Freq: Every day | ORAL | 1 refills | Status: AC

## 2024-02-03 MED ORDER — TIRZEPATIDE-WEIGHT MANAGEMENT 10 MG/0.5ML ~~LOC~~ SOLN: 10.0000 mg | SUBCUTANEOUS | 0 refills | Status: AC

## 2024-02-03 NOTE — Patient Instructions (Addendum)
 [email or web address]   I have sent in the Zepbound  vial to [employer]. Please use the above link to see if you qualify for their assistance program.  All of your labs look great. We do need to get cholesterol levels down.  [facility] [place], [place] - [address] [phone] [facility], [place] [address] Phone: [phone]  Fax: [phone]   Please return in 4 months to monitor weight loss progress and recheck cholesterol. Come FASTING.

## 2024-02-03 NOTE — Progress Notes (Signed)
 Established Patient Office Visit  Subjective:  Patient ID: Richard Beltran, male    DOB: 19-May-1965  Age: 58 y.o. MRN: [id number]  Chief Complaint  Patient presents with   Follow-up    On medication and t-shot due today    HPI  Discussed the use of AI scribe software for clinical note transcription with the patient, who gave verbal consent to proceed.  History of Present Illness   Richard Beltran is a 58 year old male who presents for a follow-up visit regarding weight management and medication refills.  He has experienced a weight loss of six to seven pounds over the past month, attributed to changes in appetite and increased exercise. He is currently taking Liposlim without any dose adjustments and is considering switching to pure Zepbound  from [employer] due to insurance coverage issues.  He has been on testosterone  injections for approximately three years, receiving 200 mg every 14 days. His testosterone  levels were recently checked and reported as optimal. He has not experienced any intolerance to the injections and has not yet received his testosterone  shot for this cycle.  He recently started Crestor  10 mg for elevated cholesterol, which he has been taking for about a week without any noticeable side effects.  He mentioned that stress might have affected his blood pressure during the last visit. He is due for refills of his blood pressure medications.  He is due for refills of tramadol .      Patient Active Problem List   Diagnosis Date Noted   Mixed hyperlipidemia 07/21/2022   Primary osteoarthritis of left knee 10/14/2020   Plantar fasciitis, left 04/10/2018   Insomnia 07/29/2015   Obesity 11/18/2014   Obstructive sleep apnea 10/22/2014   Male hypogonadism 11/06/2013   Hypertension, essential, benign 10/29/2013   Lumbar degenerative disc disease 10/29/2013   Pulmonary nodule 10/29/2013   Annual physical exam 10/29/2013   Adhesive capsulitis of right shoulder  10/29/2013   Past Medical History:  Diagnosis Date   Back pain    Hyperlipidemia    Hypertension    Hypertension    Joint pain    Lumbar degenerative disc disease 10/29/2013   Post L4-L5 fusion.    Male hypogonadism    Other fatigue    Shortness of breath on exertion    Past Surgical History:  Procedure Laterality Date   BACK SURGERY  2005   Social History   Tobacco Use   Smoking status: Never   Smokeless tobacco: Never  Vaping Use   Vaping status: Never Used  Substance Use Topics   Alcohol use: No   Drug use: No      ROS: as noted in HPI  Objective:     BP 120/66 (BP Location: Right Arm, Patient Position: Sitting, Cuff Size: Large)   Pulse 68   Ht 6' 1 (1.854 m)   Wt 278 lb (126.1 kg)   SpO2 99%   BMI 36.68 kg/m  BP Readings from Last 3 Encounters:  02/03/24 120/66  01/20/24 (!) 149/81  01/06/24 135/72   Wt Readings from Last 3 Encounters:  02/03/24 278 lb (126.1 kg)  01/06/24 284 lb (128.8 kg)  01/03/24 285 lb (129.3 kg)      Physical Exam Vitals and nursing note reviewed.  Constitutional:      General: He is not in acute distress.    Appearance: Normal appearance. He is not ill-appearing, toxic-appearing or diaphoretic.  HENT:     Head: Normocephalic and atraumatic.  Right Ear: External ear normal.     Left Ear: External ear normal.     Nose: Nose normal.  Eyes:     General: No scleral icterus.    Pupils: Pupils are equal, round, and reactive to light.  Cardiovascular:     Rate and Rhythm: Normal rate.  Pulmonary:     Effort: Pulmonary effort is normal. No respiratory distress.  Skin:    General: Skin is warm and dry.     Findings: No erythema or rash.  Neurological:     General: No focal deficit present.     Mental Status: He is alert and oriented to person, place, and time.      No results found for any visits on 02/03/24.  Last CBC Lab Results  Component Value Date   WBC 6.1 01/20/2024   HGB 15.4 01/20/2024   HCT 49.7  01/20/2024   MCV 92 01/20/2024   MCH 28.4 01/20/2024   RDW 13.6 01/20/2024   PLT 232 01/20/2024   Last metabolic panel Lab Results  Component Value Date   GLUCOSE 104 (H) 01/20/2024   NA 143 01/20/2024   K 4.3 01/20/2024   CL 103 01/20/2024   CO2 23 01/20/2024   BUN 14 01/20/2024   CREATININE 1.40 (H) 01/20/2024   EGFR 58 (L) 01/20/2024   CALCIUM  9.7 01/20/2024   PROT 6.9 01/20/2024   ALBUMIN 4.3 01/20/2024   LABGLOB 2.6 01/20/2024   AGRATIO 1.4 10/02/2020   BILITOT 0.5 01/20/2024   ALKPHOS 81 01/20/2024   AST 19 01/20/2024   ALT 24 01/20/2024   Last lipids Lab Results  Component Value Date   CHOL 218 (H) 01/20/2024   HDL 36 (L) 01/20/2024   LDLCALC 162 (H) 01/20/2024   TRIG 109 01/20/2024   CHOLHDL 6.1 (H) 01/20/2024   Last hemoglobin A1c Lab Results  Component Value Date   HGBA1C 5.1 01/20/2024   Last thyroid  functions Lab Results  Component Value Date   TSH 1.900 01/20/2024   T3TOTAL 160 10/02/2020   T4TOTAL 7.2 10/02/2020   Last vitamin D Lab Results  Component Value Date   VD25OH 14.5 (L) 10/02/2020   Last vitamin B12 and Folate Lab Results  Component Value Date   VITAMINB12 322 10/02/2020   FOLATE 6.2 10/02/2020      The 10-year ASCVD risk score (Arnett DK, et al., 2019) is: 12.4%  Assessment & Plan:  Mixed hyperlipidemia  Hypertension, essential, benign -     Lisinopril -hydroCHLOROthiazide ; Take 1 tablet by mouth daily.  Dispense: 90 tablet; Refill: 1 -     amLODIPine  Besylate; Take 1 tablet (10 mg total) by mouth daily.  Dispense: 90 tablet; Refill: 1 -     Carvedilol ; Take 1 tablet (25 mg total) by mouth 2 (two) times daily with a meal.  Dispense: 180 tablet; Refill: 3  Lumbar degenerative disc disease -     traMADol  HCl; Take 2 tablets (100 mg total) by mouth 2 (two) times daily.  Dispense: 120 tablet; Refill: 3  Male hypogonadism [E29.1]  BMI 36.0-36.9,adult -     Tirzepatide -Weight Management; Inject 10 mg into the skin once a  week.  Dispense: 6 mL; Refill: 0  Assessment and Plan    Obesity Weight loss of 6-7 pounds since last visit. Positive change in appetite and increased exercise focus. Transition to Zepbound  discussed for cost savings. - Switch to Zepbound  via [employer]. - Provide contact information for [employer] and savings program. - Encourage continued exercise  and weight management efforts.  Hyperlipidemia Newly diagnosed. Managed with Crestor  10 mg. No intolerance reported. Discussed importance of cholesterol control to mitigate cardiovascular risk. - Continue Crestor  10 mg. - Monitor cholesterol levels regularly.  Testosterone  deficiency Managed with 200 mg injections every 14 days. Levels within desired range. No dose adjustments needed. - Continue testosterone  injections at 200 mg every 14 days. - Monitor testosterone  levels every 6 months, potential for annual monitoring if stable.  Hypertension Well-controlled with current regimen. Blood pressure 116/68 mmHg. - Continue current antihypertensive medications.         Return in about 4 months (around 06/05/2024).   Benton LITTIE Gave, PA

## 2024-02-05 ENCOUNTER — Encounter: Payer: Self-pay | Admitting: Urgent Care

## 2024-02-17 ENCOUNTER — Ambulatory Visit

## 2024-02-17 VITALS — BP 147/74 | HR 72 | Resp 18 | Ht 73.0 in

## 2024-02-17 DIAGNOSIS — E291 Testicular hypofunction: Secondary | ICD-10-CM

## 2024-02-17 NOTE — Progress Notes (Signed)
 Patient is in office today for a nurse visit for Testosterone  Injection. Pt receives 200mg  every 2 weeks. Patient denies CP, SOB, mood changes, or medications changes or issues with taking medications. Patient Injection was given in the RUOQ. Patient tolerated injection well. No redness or swelling noted at the site. Patient advised to RTC in 14 days. (Around 03/02/24).

## 2024-03-01 NOTE — Progress Notes (Unsigned)
 Patient is in office today for a nurse visit for Testosterone  Injection. Pt receives 200mg  every 2 weeks. Patient denies CP, SOB, mood changes, or medications changes or problems.  . Patient Injection was given in the LUOQ. Patient tolerated injection well. No redness or swelling noted at the site. Patient advised to RTC in 14 days. (Around 03/16/24).

## 2024-03-02 ENCOUNTER — Ambulatory Visit (INDEPENDENT_AMBULATORY_CARE_PROVIDER_SITE_OTHER)

## 2024-03-02 VITALS — BP 155/89 | HR 82 | Resp 18 | Ht 73.0 in

## 2024-03-02 DIAGNOSIS — E291 Testicular hypofunction: Secondary | ICD-10-CM | POA: Diagnosis not present

## 2024-03-16 ENCOUNTER — Ambulatory Visit (INDEPENDENT_AMBULATORY_CARE_PROVIDER_SITE_OTHER)

## 2024-03-16 ENCOUNTER — Other Ambulatory Visit: Payer: Self-pay

## 2024-03-16 VITALS — BP 139/74 | HR 81 | Resp 18 | Ht 73.0 in | Wt 278.0 lb

## 2024-03-16 DIAGNOSIS — E291 Testicular hypofunction: Secondary | ICD-10-CM | POA: Diagnosis not present

## 2024-03-16 DIAGNOSIS — M51369 Other intervertebral disc degeneration, lumbar region without mention of lumbar back pain or lower extremity pain: Secondary | ICD-10-CM

## 2024-03-16 MED ORDER — MELOXICAM 15 MG PO TABS: 15.0000 mg | ORAL_TABLET | Freq: Every day | ORAL | 0 refills | Status: DC

## 2024-03-16 MED ORDER — TRAMADOL HCL 50 MG PO TABS: 100.0000 mg | ORAL_TABLET | Freq: Two times a day (BID) | ORAL | 3 refills | Status: DC

## 2024-03-16 NOTE — Progress Notes (Signed)
   Subjective:    Patient ID: Richard Beltran, male    DOB: 06-21-1965, 58 y.o.   MRN: [id number]  HPI  Patient is in office today for a nurse visit for Testosterone  Injection. Pt receives 200 mg every 2 weeks. Patient denies CP, SOB, mood changes, or medications changes or problems. Patient is also requesting a refill on his meloxicam  and tramadol .    Review of Systems     Objective:   Physical Exam        Assessment & Plan:    . Patient Injection was given in the LUOQ. Patient tolerated injection well. No redness or swelling noted at the site. Patient advised to RTC in 14 days. (Around 03/28/24-04/02/24). Due to the holiday

## 2024-03-27 NOTE — Progress Notes (Unsigned)
   Subjective:    Patient ID: Richard Beltran, male    DOB: Sep 29, 1965, 58 y.o.   MRN: [id number]  HPI  Patient is in office today for a nurse visit for Testosterone  Injection. Pt receives 200 mg every 2 weeks. Patient denies CP, SOB, mood changes, or medications changes or problems.  Review of Systems     Objective:   Physical Exam        Assessment & Plan:   Patient Injection was given in the RUOQ. Patient tolerated injection well. No redness or swelling noted at the site. Patient advised to RTC in 14 days. (Around 04/10/24).

## 2024-03-28 ENCOUNTER — Ambulatory Visit

## 2024-03-28 VITALS — BP 131/71 | HR 80 | Ht 73.0 in

## 2024-03-28 DIAGNOSIS — E291 Testicular hypofunction: Secondary | ICD-10-CM

## 2024-03-28 NOTE — Patient Instructions (Signed)
Return in 14 days for next testosterone injection as nurse visit.

## 2024-03-28 NOTE — Progress Notes (Signed)
   Established Patient Office Visit  Subjective   Patient ID: Richard Beltran, male    DOB: 21-Jan-1966  Age: 58 y.o. MRN: [id number]  Chief Complaint  Patient presents with   Male hypogonadism     Testosterone  injection nurse visit.     HPI  Male hypogonadism - testosterone  injection nurse visit. Patient denies chest pain , shortness of breath, palpitations, dizziness, headaches , mood changes or medication problems. Last lab work to check testosterone  levels drawn 01/20/2024.  ROS    Objective:     BP 131/71   Pulse 80   Ht 6' 1 (1.854 m)   SpO2 98%   BMI 36.68 kg/m    Physical Exam   No results found for any visits on 03/28/24.    The 10-year ASCVD risk score (Arnett DK, et al., 2019) is: 14.5%    Assessment & Plan:  Testosterone  injection - admin 200mg  IM RUOQ . Patient tolerated injection well without complications. Return in 14 days for next injection of testosterone  as nurse visit.  Problem List Items Addressed This Visit       Endocrine   Male hypogonadism - Primary    Return in about 2 weeks (around 04/11/2024) for nurse visit for testosterone  injection. Richard Suzen SHAUNNA Alpheus, LPN

## 2024-04-11 NOTE — Progress Notes (Signed)
° °  Subjective:    Patient ID: Richard Beltran, male    DOB: 1965/05/05, 58 y.o.   MRN: [id number]  HPI  Patient is in office today for a nurse visit for Testosterone  Injection. Pt receives 200 mg every 2 weeks. Patient denies CP, SOB, mood changes, or medications changes or problems   Review of Systems     Objective:   Physical Exam        Assessment & Plan:   Patient Injection was given in the LUOQ. Patient tolerated injection well. No redness or swelling noted at the site. Patient advised to RTC in 14 days. (Around 04/24/24 or 04/27/24). Due to the holiday

## 2024-04-13 ENCOUNTER — Ambulatory Visit

## 2024-04-13 VITALS — BP 143/81 | HR 77 | Resp 18 | Ht 73.0 in | Wt 278.0 lb

## 2024-04-13 DIAGNOSIS — E291 Testicular hypofunction: Secondary | ICD-10-CM

## 2024-04-24 NOTE — Progress Notes (Signed)
" ° °  Subjective:    Patient ID: Richard Beltran, male    DOB: 1966/02/01, 58 y.o.   MRN: [id number]  HPI  Patient is in office today for a nurse visit for Testosterone  Injection. Pt receives 200 mg every 2 weeks. Patient denies CP, SOB, mood changes, or medications changes. Pt is also requesting a refill on his tramodol. Be sent to the [facility] [address]. Will pend the request for [place]. Review of Systems     Objective:   Physical Exam        Assessment & Plan:   Patient Injection was given in the RUOQ. Patient tolerated injection well. No redness or swelling noted at the site. Patient advised to RTC in 14 days. (Around 05/11/24 ).  "

## 2024-04-27 ENCOUNTER — Ambulatory Visit

## 2024-04-27 VITALS — BP 145/77 | HR 75 | Resp 18 | Ht 73.0 in | Wt 278.0 lb

## 2024-04-27 DIAGNOSIS — E291 Testicular hypofunction: Secondary | ICD-10-CM

## 2024-04-27 DIAGNOSIS — M51369 Other intervertebral disc degeneration, lumbar region without mention of lumbar back pain or lower extremity pain: Secondary | ICD-10-CM

## 2024-04-27 MED ORDER — TRAMADOL HCL 50 MG PO TABS: 100.0000 mg | ORAL_TABLET | Freq: Two times a day (BID) | ORAL | 3 refills | Status: DC

## 2024-04-27 NOTE — Addendum Note (Signed)
 Addended byBETHA DUWAINE RIGGS on: 04/27/2024 09:59 AM   Modules accepted: Orders

## 2024-05-10 NOTE — Progress Notes (Signed)
" ° °  Subjective:    Patient ID: Richard Beltran, male    DOB: 02/03/1966, 59 y.o.   MRN: [id number]  HPI  Patient is in office today for a nurse visit for Testosterone  Injection. Pt receives 200 mg every 2 weeks. Patient denies CP, SOB, mood changes, or medications changes.   Review of Systems     Objective:   Physical Exam        Assessment & Plan:   Patient Injection was given in the LUOQ. Patient tolerated injection well. No redness or swelling noted at the site. Patient advised to RTC in 14 days. (Around 05/25/24) Patients BP was also elevated in office today his first was 150/76 and his second one was 158/72. Reported to PCP benton Gave, PA-C who is going to review the patients chart and make any neccessary changes. His next scheduled OV with [facility] is  06/08/24. We will call the patient with any necessary changes. "

## 2024-05-11 ENCOUNTER — Ambulatory Visit

## 2024-05-11 VITALS — BP 158/72 | HR 72 | Resp 18 | Ht 73.0 in

## 2024-05-11 DIAGNOSIS — E291 Testicular hypofunction: Secondary | ICD-10-CM | POA: Diagnosis not present

## 2024-05-25 ENCOUNTER — Ambulatory Visit

## 2024-05-25 VITALS — BP 137/70 | HR 70 | Resp 18 | Ht 73.0 in

## 2024-05-25 DIAGNOSIS — E291 Testicular hypofunction: Secondary | ICD-10-CM | POA: Diagnosis not present

## 2024-05-25 NOTE — Progress Notes (Signed)
" ° °  Subjective:    Patient ID: Richard Beltran, male    DOB: 1965-10-14, 59 y.o.   MRN: [id number]  HPI  Patient is in office today for a nurse visit for Testosterone  Injection. Pt receives 200 mg every 2 weeks. Patient denies CP, SOB, mood changes, or medications changes.   Review of Systems     Objective:   Physical Exam        Assessment & Plan:   Patient Injection was given in the RUOQ. Patient tolerated injection well. No redness or swelling noted at the site. Patient advised to RTC in 14 days. (Around 06/08/24) in which patient already has scheduled appt. With PCP. "

## 2024-06-08 ENCOUNTER — Ambulatory Visit: Admitting: Urgent Care

## 2024-06-08 DIAGNOSIS — E782 Mixed hyperlipidemia: Secondary | ICD-10-CM

## 2024-06-08 DIAGNOSIS — M51369 Other intervertebral disc degeneration, lumbar region without mention of lumbar back pain or lower extremity pain: Secondary | ICD-10-CM

## 2024-06-08 MED ORDER — TRAMADOL HCL ER 100 MG PO TB24: 100.0000 mg | ORAL_TABLET | Freq: Every day | ORAL | 2 refills | Status: AC

## 2024-06-08 MED ORDER — ROSUVASTATIN CALCIUM 10 MG PO TABS: 10.0000 mg | ORAL_TABLET | Freq: Every day | ORAL | 3 refills | Status: AC

## 2024-06-08 MED ORDER — MELOXICAM 15 MG PO TABS: 15.0000 mg | ORAL_TABLET | Freq: Every day | ORAL | 0 refills | Status: AC

## 2024-06-08 NOTE — Progress Notes (Unsigned)
" ° °  Established Patient Office Visit  Subjective:  Patient ID: Richard Beltran, male    DOB: Jun 26, 1965  Age: 59 y.o. MRN: [id number]  Chief Complaint  Patient presents with   Weight Check    HPI  {History (Optional):23778}  ROS: as noted in HPI  Objective:     BP 117/70   Pulse 73   Ht 6' 1 (1.854 m)   Wt 273 lb (123.8 kg)   SpO2 98%   BMI 36.02 kg/m  BP Readings from Last 3 Encounters:  06/08/24 117/70  05/25/24 137/70  05/11/24 (!) 158/72   Wt Readings from Last 3 Encounters:  06/08/24 273 lb (123.8 kg)  04/27/24 278 lb (126.1 kg)  04/13/24 278 lb (126.1 kg)      Physical Exam   No results found for any visits on 06/08/24.  Last CBC Lab Results  Component Value Date   WBC 6.1 01/20/2024   HGB 15.4 01/20/2024   HCT 49.7 01/20/2024   MCV 92 01/20/2024   MCH 28.4 01/20/2024   RDW 13.6 01/20/2024   PLT 232 01/20/2024   Last metabolic panel Lab Results  Component Value Date   GLUCOSE 104 (H) 01/20/2024   NA 143 01/20/2024   K 4.3 01/20/2024   CL 103 01/20/2024   CO2 23 01/20/2024   BUN 14 01/20/2024   CREATININE 1.40 (H) 01/20/2024   EGFR 58 (L) 01/20/2024   CALCIUM  9.7 01/20/2024   PROT 6.9 01/20/2024   ALBUMIN 4.3 01/20/2024   LABGLOB 2.6 01/20/2024   AGRATIO 1.4 10/02/2020   BILITOT 0.5 01/20/2024   ALKPHOS 81 01/20/2024   AST 19 01/20/2024   ALT 24 01/20/2024   Last lipids Lab Results  Component Value Date   CHOL 218 (H) 01/20/2024   HDL 36 (L) 01/20/2024   LDLCALC 162 (H) 01/20/2024   TRIG 109 01/20/2024   CHOLHDL 6.1 (H) 01/20/2024   Last hemoglobin A1c Lab Results  Component Value Date   HGBA1C 5.1 01/20/2024   Last thyroid  functions Lab Results  Component Value Date   TSH 1.900 01/20/2024   T3TOTAL 160 10/02/2020   T4TOTAL 7.2 10/02/2020   Last vitamin D Lab Results  Component Value Date   VD25OH 14.5 (L) 10/02/2020   Last vitamin B12 and Folate Lab Results  Component Value Date   VITAMINB12 322 10/02/2020    FOLATE 6.2 10/02/2020      The 10-year ASCVD risk score (Arnett DK, et al., 2019) is: 11.9%  Assessment & Plan:  Lumbar degenerative disc disease  Mixed hyperlipidemia     No follow-ups on file.   Benton LITTIE Gave, PA "

## 2024-06-08 NOTE — Patient Instructions (Signed)
 I have ordered you new CPAP supplies Use the nasal pillow This may help with further weight loss.  Add gabapentin  at night. This will help with nerve pain. You can take it up to three times daily as tolerated.  I have changed your tramadol  to an extended release tab to help with pain during the day.  You can increase your tirzepatide  to 125units for the next 4 shots, then 150 units after that. Try to increase your physical activity.  Return in 3 months FASTING for labs. Must be 8am to recheck Testosterone  levels.  PLEASE restart your cholesterol medication.

## 2024-06-22 ENCOUNTER — Ambulatory Visit

## 2024-07-06 ENCOUNTER — Ambulatory Visit

## 2024-09-05 ENCOUNTER — Ambulatory Visit: Admitting: Urgent Care
